# Patient Record
Sex: Female | Born: 1965 | Race: White | Hispanic: No | Marital: Married | Smoking: Current every day smoker
Health system: Southern US, Community
[De-identification: ages and names within clinical notes are randomized; demographics above are authoritative.]

## PROBLEM LIST (undated history)

## (undated) DIAGNOSIS — F32A Depression, unspecified: Secondary | ICD-10-CM

## (undated) DIAGNOSIS — Z974 Presence of external hearing-aid: Secondary | ICD-10-CM

## (undated) DIAGNOSIS — R7303 Prediabetes: Secondary | ICD-10-CM

## (undated) DIAGNOSIS — I219 Acute myocardial infarction, unspecified: Secondary | ICD-10-CM

## (undated) DIAGNOSIS — Z7289 Other problems related to lifestyle: Secondary | ICD-10-CM

## (undated) DIAGNOSIS — G2581 Restless legs syndrome: Secondary | ICD-10-CM

## (undated) DIAGNOSIS — R45851 Suicidal ideations: Secondary | ICD-10-CM

## (undated) DIAGNOSIS — I209 Angina pectoris, unspecified: Secondary | ICD-10-CM

## (undated) DIAGNOSIS — Z972 Presence of dental prosthetic device (complete) (partial): Secondary | ICD-10-CM

## (undated) DIAGNOSIS — F419 Anxiety disorder, unspecified: Secondary | ICD-10-CM

## (undated) DIAGNOSIS — K589 Irritable bowel syndrome without diarrhea: Secondary | ICD-10-CM

## (undated) DIAGNOSIS — E119 Type 2 diabetes mellitus without complications: Secondary | ICD-10-CM

## (undated) DIAGNOSIS — H9190 Unspecified hearing loss, unspecified ear: Secondary | ICD-10-CM

## (undated) DIAGNOSIS — M65312 Trigger thumb, left thumb: Secondary | ICD-10-CM

## (undated) DIAGNOSIS — H9192 Unspecified hearing loss, left ear: Secondary | ICD-10-CM

## (undated) DIAGNOSIS — J449 Chronic obstructive pulmonary disease, unspecified: Secondary | ICD-10-CM

## (undated) DIAGNOSIS — G4730 Sleep apnea, unspecified: Secondary | ICD-10-CM

## (undated) DIAGNOSIS — R569 Unspecified convulsions: Secondary | ICD-10-CM

## (undated) DIAGNOSIS — K219 Gastro-esophageal reflux disease without esophagitis: Secondary | ICD-10-CM

## (undated) DIAGNOSIS — I10 Essential (primary) hypertension: Secondary | ICD-10-CM

## (undated) HISTORY — PX: TYMPANOSTOMY TUBE PLACEMENT: SHX32

## (undated) HISTORY — DX: Restless legs syndrome: G25.81

## (undated) HISTORY — DX: Sleep apnea, unspecified: G47.30

## (undated) HISTORY — DX: Other problems related to lifestyle: Z72.89

## (undated) HISTORY — PX: DILATION AND CURETTAGE OF UTERUS: SHX78

## (undated) HISTORY — PX: TONSILLECTOMY: SUR1361

## (undated) HISTORY — DX: Irritable bowel syndrome, unspecified: K58.9

## (undated) HISTORY — DX: Unspecified hearing loss, unspecified ear: H91.90

## (undated) HISTORY — PX: CARDIAC CATHETERIZATION: SHX172

## (undated) HISTORY — DX: Suicidal ideations: R45.851

## (undated) HISTORY — PX: ABDOMINAL HYSTERECTOMY: SHX81

## (undated) HISTORY — PX: CHOLECYSTECTOMY: SHX55

## (undated) HISTORY — DX: Acute myocardial infarction, unspecified: I21.9

## (undated) HISTORY — PX: HERNIA REPAIR: SHX51

## (undated) HISTORY — PX: ACHILLES TENDON REPAIR: SUR1153

## (undated) HISTORY — DX: Chronic obstructive pulmonary disease, unspecified: J44.9

---

## 2005-03-16 HISTORY — PX: CARDIAC CATHETERIZATION: SHX172

## 2005-03-27 ENCOUNTER — Emergency Department: Payer: Self-pay | Admitting: Emergency Medicine

## 2005-04-01 ENCOUNTER — Emergency Department: Payer: Self-pay | Admitting: General Practice

## 2005-04-21 ENCOUNTER — Encounter: Payer: Self-pay | Admitting: Cardiovascular Disease

## 2005-05-14 ENCOUNTER — Encounter: Payer: Self-pay | Admitting: Cardiovascular Disease

## 2005-06-14 ENCOUNTER — Encounter: Payer: Self-pay | Admitting: Cardiovascular Disease

## 2005-07-14 ENCOUNTER — Encounter: Payer: Self-pay | Admitting: Cardiovascular Disease

## 2005-08-14 ENCOUNTER — Encounter: Payer: Self-pay | Admitting: Cardiovascular Disease

## 2005-09-13 ENCOUNTER — Encounter: Payer: Self-pay | Admitting: Cardiovascular Disease

## 2005-10-01 ENCOUNTER — Other Ambulatory Visit: Payer: Self-pay

## 2005-10-08 ENCOUNTER — Ambulatory Visit: Payer: Self-pay | Admitting: Surgery

## 2005-10-14 ENCOUNTER — Encounter: Payer: Self-pay | Admitting: Cardiovascular Disease

## 2006-01-05 ENCOUNTER — Ambulatory Visit: Payer: Self-pay | Admitting: Unknown Physician Specialty

## 2006-03-22 ENCOUNTER — Ambulatory Visit: Payer: Self-pay | Admitting: Gastroenterology

## 2006-04-22 ENCOUNTER — Ambulatory Visit: Payer: Self-pay | Admitting: Unknown Physician Specialty

## 2006-06-24 ENCOUNTER — Emergency Department: Payer: Self-pay | Admitting: Emergency Medicine

## 2006-06-30 ENCOUNTER — Other Ambulatory Visit: Payer: Self-pay

## 2006-06-30 ENCOUNTER — Ambulatory Visit: Payer: Self-pay | Admitting: Orthopedic Surgery

## 2006-07-07 ENCOUNTER — Other Ambulatory Visit: Payer: Self-pay

## 2006-07-07 ENCOUNTER — Inpatient Hospital Stay: Payer: Self-pay | Admitting: Internal Medicine

## 2006-07-08 ENCOUNTER — Other Ambulatory Visit: Payer: Self-pay

## 2006-08-05 ENCOUNTER — Inpatient Hospital Stay: Payer: Self-pay | Admitting: Unknown Physician Specialty

## 2007-07-14 ENCOUNTER — Ambulatory Visit: Payer: Self-pay | Admitting: Internal Medicine

## 2007-09-10 ENCOUNTER — Emergency Department: Payer: Self-pay | Admitting: Unknown Physician Specialty

## 2008-02-10 ENCOUNTER — Emergency Department: Payer: Self-pay | Admitting: Emergency Medicine

## 2008-04-18 IMAGING — CR DG CHEST 2V
1 series · 2 of 2 positions shown · non-contrast
Comparison: none

REASON FOR EXAM: hypertension
COMMENTS:

PROCEDURE:     DXR - DXR CHEST PA (OR AP) AND LATERAL  - December 28, 2005 [DATE]
RESULT:          The lung fields are clear.  No pneumonia, pneumothorax, or
pleural effusion is seen. The heart, mediastinal and osseous structures show
no significant abnormalities.

[Series 1: view not recorded · 0.17mm/px · 2 of 2 slices shown]
[im 1/2]
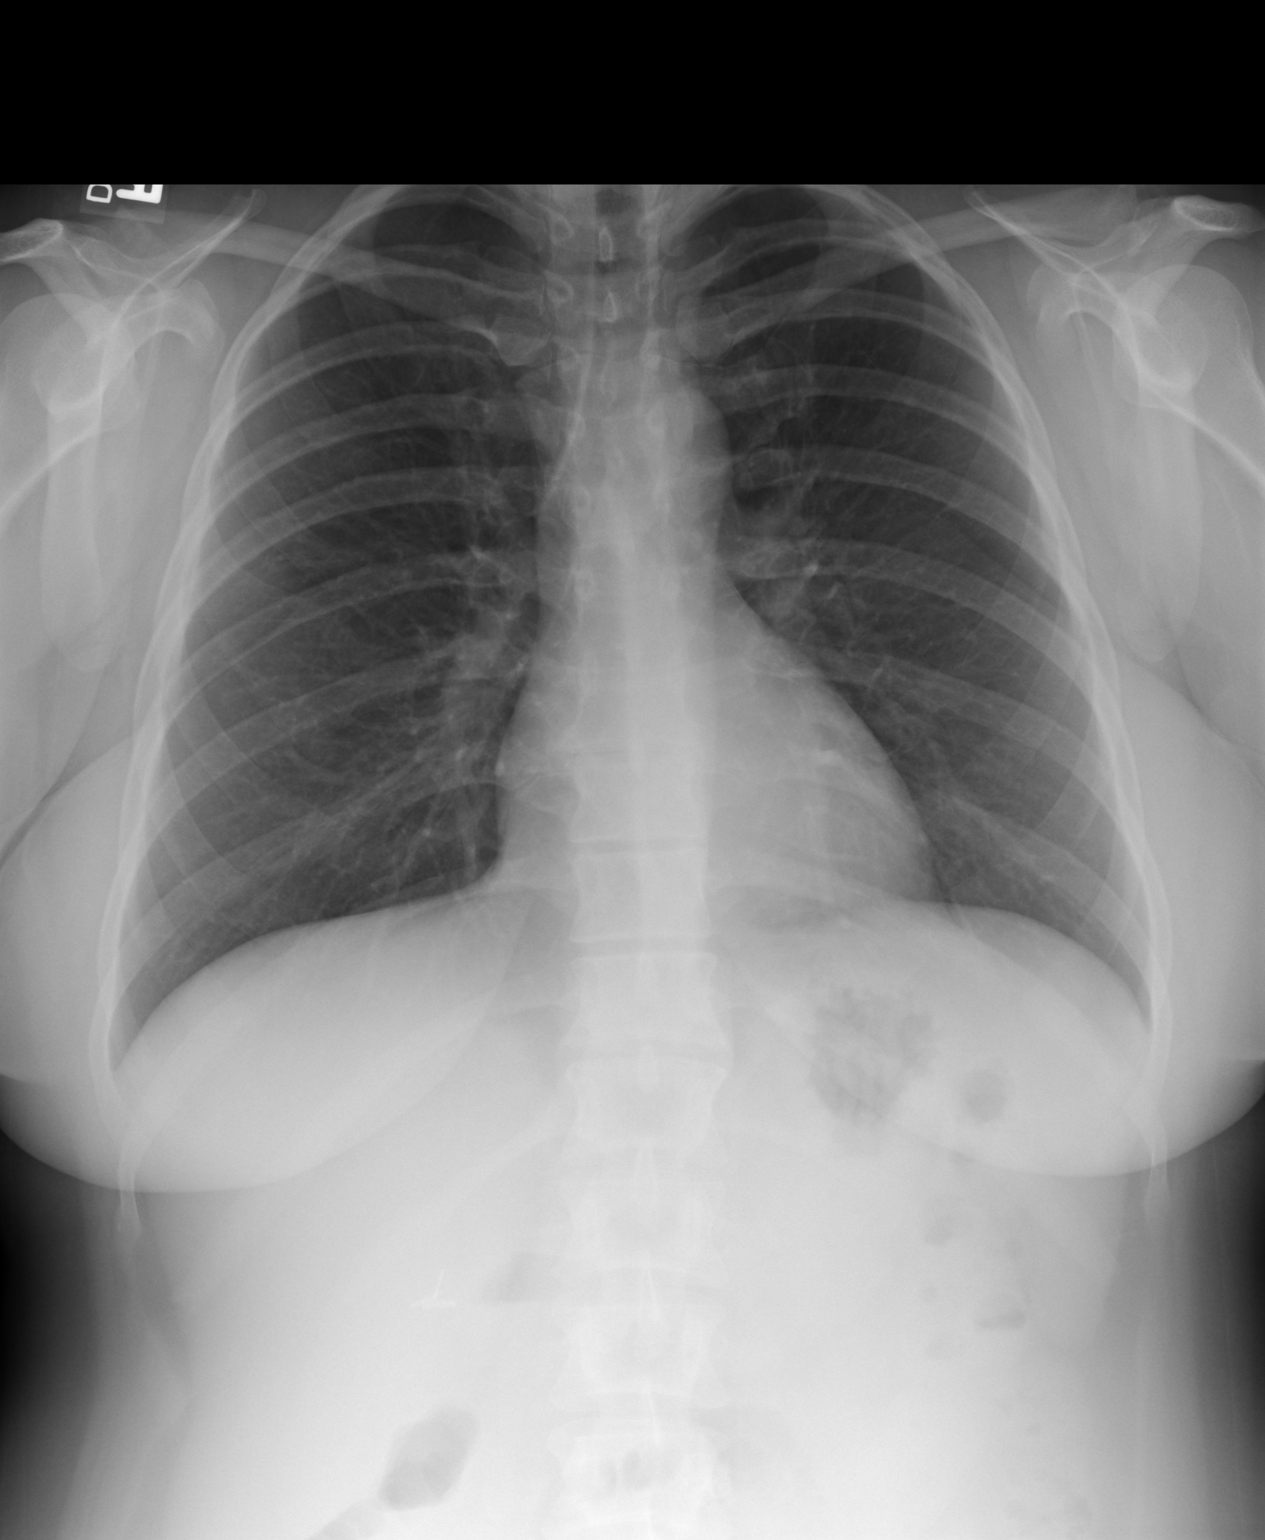
[im 2/2]
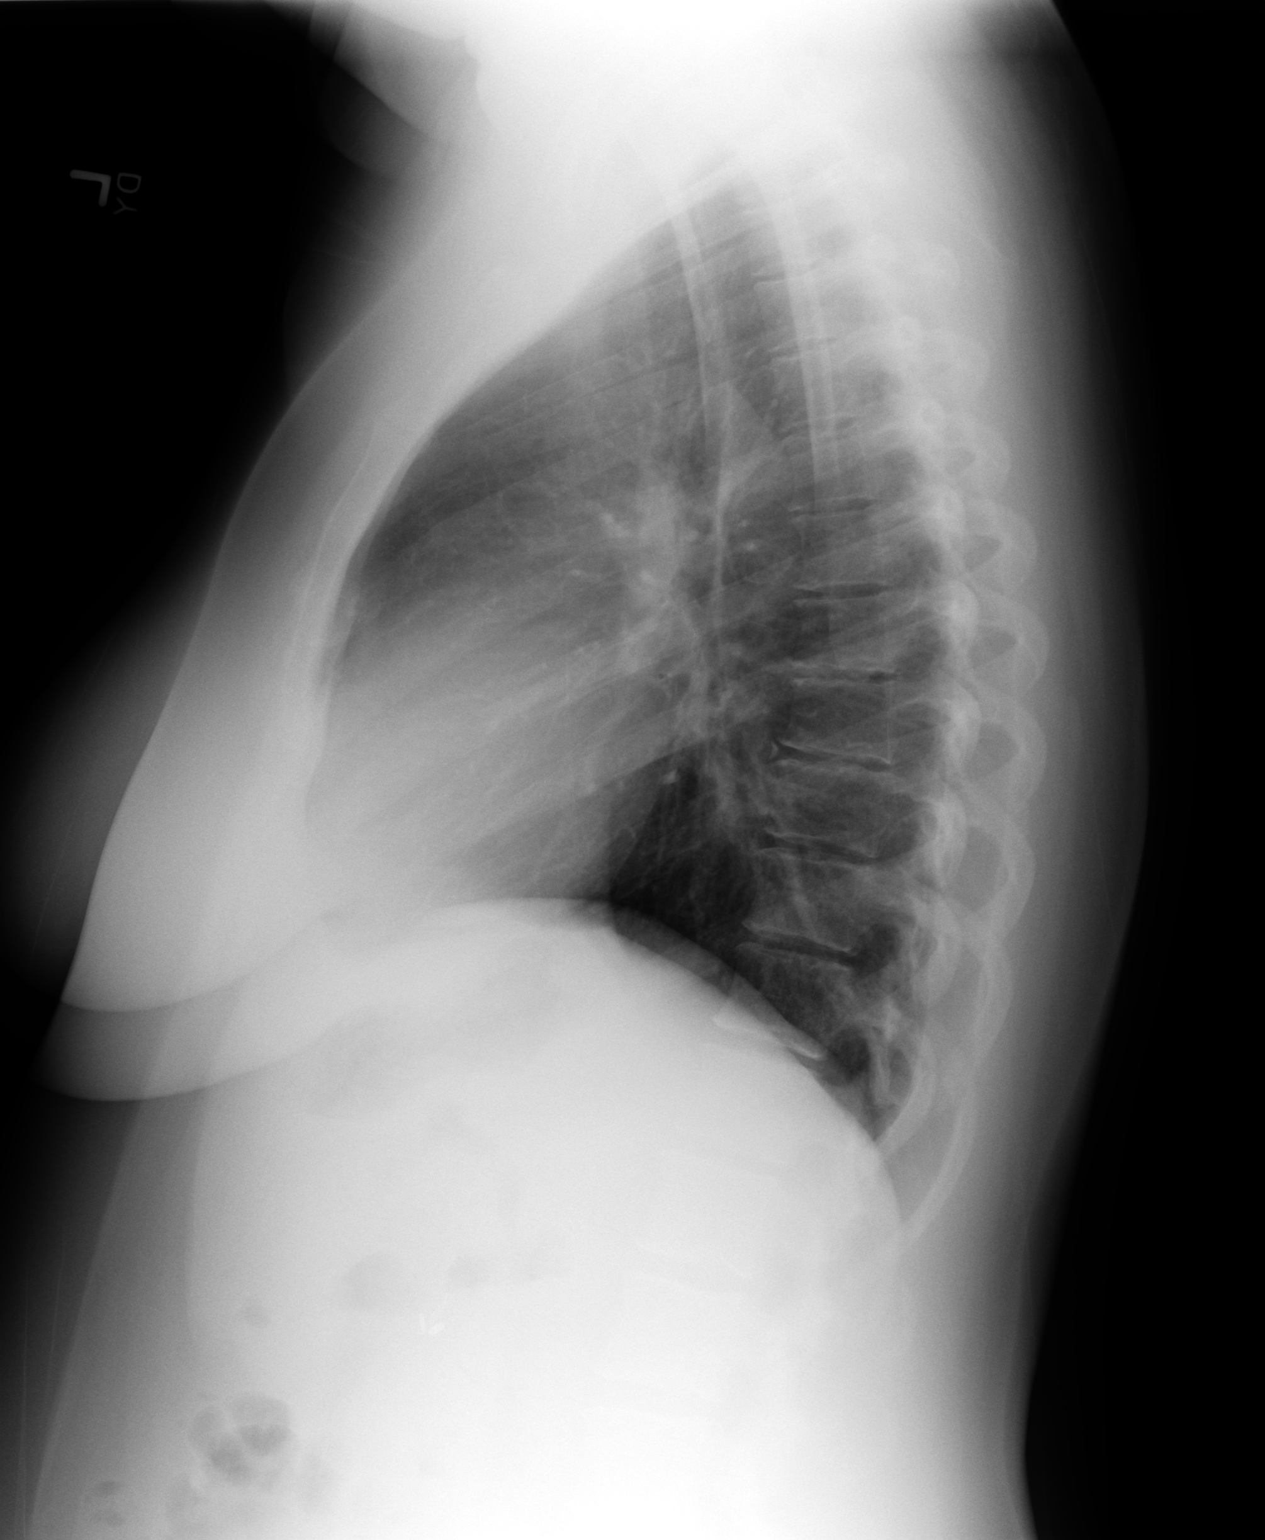

[2 of 2 positions shown; findings below may reference images not displayed]

IMPRESSION: 1.     No significant abnormalities are noted.
2.     No significant interval changes are seen as compared to the prior
exam of 03/27/2005.

## 2008-05-31 ENCOUNTER — Ambulatory Visit: Payer: Self-pay | Admitting: Surgery

## 2008-06-04 ENCOUNTER — Ambulatory Visit: Payer: Self-pay | Admitting: Surgery

## 2008-08-11 ENCOUNTER — Emergency Department: Payer: Self-pay | Admitting: Unknown Physician Specialty

## 2011-06-06 ENCOUNTER — Emergency Department: Payer: Self-pay | Admitting: Internal Medicine

## 2011-06-06 LAB — COMPREHENSIVE METABOLIC PANEL
Alkaline Phosphatase: 61 U/L (ref 50–136)
Anion Gap: 9 (ref 7–16)
BUN: 6 mg/dL — ABNORMAL LOW (ref 7–18)
Bilirubin,Total: 0.2 mg/dL (ref 0.2–1.0)
Chloride: 106 mmol/L (ref 98–107)
Co2: 28 mmol/L (ref 21–32)
Creatinine: 0.75 mg/dL (ref 0.60–1.30)
EGFR (African American): >60
Glucose: 101 mg/dL — ABNORMAL HIGH (ref 65–99)
Osmolality: 283 (ref 275–301)
Potassium: 3.7 mmol/L (ref 3.5–5.1)
SGPT (ALT): 31 U/L
Sodium: 143 mmol/L (ref 136–145)
Total Protein: 7.4 g/dL (ref 6.4–8.2)

## 2011-06-06 LAB — CBC
HCT: 41.5 % (ref 35.0–47.0)
HGB: 14 g/dL (ref 12.0–16.0)
MCH: 29.8 pg (ref 26.0–34.0)
MCHC: 33.7 g/dL (ref 32.0–36.0)
WBC: 5.9 10*3/uL (ref 3.6–11.0)

## 2011-06-06 LAB — CK TOTAL AND CKMB (NOT AT ARMC): CK, Total: 62 U/L (ref 21–215)

## 2011-06-06 LAB — TROPONIN I: Troponin-I: <0.02 ng/mL

## 2011-06-06 LAB — LIPASE, BLOOD: Lipase: 102 U/L (ref 73–393)

## 2012-08-25 ENCOUNTER — Ambulatory Visit: Payer: Self-pay | Admitting: Internal Medicine

## 2012-11-16 ENCOUNTER — Emergency Department: Payer: Self-pay | Admitting: Emergency Medicine

## 2012-11-24 ENCOUNTER — Emergency Department: Payer: Self-pay | Admitting: Emergency Medicine

## 2012-11-24 LAB — COMPREHENSIVE METABOLIC PANEL
Alkaline Phosphatase: 78 U/L (ref 50–136)
Anion Gap: 9 (ref 7–16)
BUN: 10 mg/dL (ref 7–18)
Chloride: 105 mmol/L (ref 98–107)
Co2: 25 mmol/L (ref 21–32)
Creatinine: 0.89 mg/dL (ref 0.60–1.30)
Glucose: 146 mg/dL — ABNORMAL HIGH (ref 65–99)
Osmolality: 279 (ref 275–301)
SGOT(AST): 22 U/L (ref 15–37)
SGPT (ALT): 25 U/L (ref 12–78)
Total Protein: 7.2 g/dL (ref 6.4–8.2)

## 2012-11-24 LAB — CBC
HCT: 40.5 % (ref 35.0–47.0)
HGB: 13.6 g/dL (ref 12.0–16.0)
MCH: 29.7 pg (ref 26.0–34.0)
MCV: 88 fL (ref 80–100)
Platelet: 348 10*3/uL (ref 150–440)
RBC: 4.59 10*6/uL (ref 3.80–5.20)
RDW: 13.8 % (ref 11.5–14.5)
WBC: 10 10*3/uL (ref 3.6–11.0)

## 2012-11-24 LAB — TROPONIN I: Troponin-I: <0.02 ng/mL

## 2013-09-20 ENCOUNTER — Emergency Department: Payer: Self-pay | Admitting: Emergency Medicine

## 2013-09-20 LAB — CBC
HCT: 43.2 % (ref 35.0–47.0)
HGB: 14.4 g/dL (ref 12.0–16.0)
MCH: 30.2 pg (ref 26.0–34.0)
MCHC: 33.2 g/dL (ref 32.0–36.0)
MCV: 91 fL (ref 80–100)
PLATELETS: 325 10*3/uL (ref 150–440)
RBC: 4.76 10*6/uL (ref 3.80–5.20)
RDW: 13.7 % (ref 11.5–14.5)
WBC: 7.9 10*3/uL (ref 3.6–11.0)

## 2013-09-20 LAB — BASIC METABOLIC PANEL
Anion Gap: 8 (ref 7–16)
BUN: 12 mg/dL (ref 7–18)
Calcium, Total: 8.6 mg/dL (ref 8.5–10.1)
Chloride: 104 mmol/L (ref 98–107)
Co2: 27 mmol/L (ref 21–32)
Creatinine: 0.82 mg/dL (ref 0.60–1.30)
EGFR (African American): >60
EGFR (Non-African Amer.): >60
Glucose: 157 mg/dL — ABNORMAL HIGH (ref 65–99)
Osmolality: 281 (ref 275–301)
Potassium: 3.8 mmol/L (ref 3.5–5.1)
Sodium: 139 mmol/L (ref 136–145)

## 2013-09-20 LAB — TROPONIN I
Troponin-I: <0.02 ng/mL
Troponin-I: <0.02 ng/mL

## 2014-04-02 ENCOUNTER — Emergency Department: Payer: Self-pay | Admitting: Emergency Medicine

## 2014-11-11 ENCOUNTER — Encounter: Payer: Self-pay | Admitting: Emergency Medicine

## 2014-11-11 ENCOUNTER — Emergency Department: Payer: BLUE CROSS/BLUE SHIELD

## 2014-11-11 ENCOUNTER — Emergency Department
Admission: EM | Admit: 2014-11-11 | Discharge: 2014-11-11 | Disposition: A | Discharge status: Home / Self Care | Payer: BLUE CROSS/BLUE SHIELD | Attending: Emergency Medicine | Admitting: Emergency Medicine

## 2014-11-11 DIAGNOSIS — Z720 Tobacco use: Secondary | ICD-10-CM | POA: Insufficient documentation

## 2014-11-11 DIAGNOSIS — M25561 Pain in right knee: Secondary | ICD-10-CM | POA: Diagnosis not present

## 2014-11-11 MED ORDER — IBUPROFEN 600 MG PO TABS: 600.0000 mg | ORAL_TABLET | Freq: Three times a day (TID) | ORAL | Status: DC | PRN

## 2014-11-11 NOTE — Discharge Instructions (Signed)
You were evaluated for right knee pain, and although no certain cause was found, your exam and evaluation are reassuring. I suspect a musculoskeletal source. Continue anti-inflammatory such as ibuprofen as needed 3 times daily. Wear the splint for comfort. If not improved in one week, see her primary care physician for consideration of further imaging such as an MRI to evaluate the ligaments.  Return to the emergency department for any worsening and, redness, swelling, fever, muscle aches, dizziness, or any other symptoms concerning to you.

## 2014-11-11 NOTE — ED Notes (Signed)
Pt states this pain states this pain started Monday night pt states she was sleeping and it woke her up from her sleep. Pt states there was swelling to her knee leg and foot currently not swelling other than to her foot. Pt is tender on the inside of her knee when palpated.

## 2014-11-11 NOTE — ED Notes (Signed)
Pt states right leg swelling that began approx 6 days pta. Pt with swelling noted to right ankle and right calf. Pt states medial right knee is tender when moving. Pt denies fever, denies shob. Pt smokes but is not on hormones, no long stasis per pt.

## 2014-11-11 NOTE — ED Provider Notes (Signed)
[facility] Emergency Department Provider Note   ____________________________________________  Time seen: 10:45 PM I have reviewed the triage vital signs and the triage nursing note.  HISTORY  Chief Complaint No chief complaint on file.   Historian Patient and her spouse  HPI Brandi Acevedo is a 49 y.o. female who is presenting for pain to her right knee. It started on Monday when she woke up with this pain. Has been ongoing since that time. No known trauma or overuse injury. She's noticed some lower extremity swelling from her knee down to her foot. No redness or skin rash. No fever.. No systemic symptoms. She has been using naproxen which has helped some. She finally decided to come in because it was not going away and she had foot swelling.    No past medical history on file.  There are no active problems to display for this patient.   No past surgical history on file.  Current Outpatient Rx  Name  Route  Sig  Dispense  Refill  . ibuprofen (ADVIL,MOTRIN) 600 MG tablet   Oral   Take 1 tablet (600 mg total) by mouth every 8 (eight) hours as needed.   20 tablet   0     Allergies Codeine; Flagyl; Keflex; and Septra  No family history on file.  Social History Social History  Substance Use Topics  . Smoking status: Current Some Day Smoker  . Smokeless tobacco: Never Used  . Alcohol Use: No    Review of Systems  Constitutional: Negative for fever. Eyes: Negative for visual changes. ENT: Negative for sore throat. Cardiovascular: Negative for chest pain. Respiratory: Negative for shortness of breath. Gastrointestinal: Negative for abdominal pain, vomiting and diarrhea. Genitourinary: Negative for dysuria. Musculoskeletal: Negative for back pain. Skin: Negative for rash. Neurological: Negative for headache. 10 point Review of Systems otherwise negative ____________________________________________   PHYSICAL EXAM:  VITAL  SIGNS: ED Triage Vitals  Enc Vitals Group     BP 11/11/14 2014 128/61 mmHg     Pulse Rate 11/11/14 2014 72     Resp 11/11/14 2014 18     Temp 11/11/14 2014 99.1 F (37.3 C)     Temp Source 11/11/14 2014 Oral     SpO2 11/11/14 2014 96 %     Weight 11/11/14 2014 164 lb (74.39 kg)     Height 11/11/14 2014 5\' 1"  (1.549 m)     Head Cir --      Peak Flow --      Pain Score 11/11/14 2021 7     Pain Loc --      Pain Edu? --      Excl. in [place]? --      Constitutional: Alert and oriented. Well appearing and in no distress. Eyes: Conjunctivae are normal. PERRL. Normal extraocular movements. ENT   Head: Normocephalic and atraumatic.   Nose: No congestion/rhinnorhea.   Mouth/Throat: Mucous membranes are moist.   Neck: No stridor. Cardiovascular/Chest: Normal rate, regular rhythm.  No murmurs, rubs, or gallops. Respiratory: Normal respiratory effort without tachypnea nor retractions. Breath sounds are clear and equal bilaterally. No wheezes/rales/rhonchi. Gastrointestinal: Soft. No distention, no guarding, no rebound. Nontender   Genitourinary/rectal:Deferred Musculoskeletal: Nontender with normal range of motion in all extremities. No joint effusions.  Right knee medial and anterior joint margin slightly tender to palpation and with bending of the knee. No redness, or warmth. Minimal lower extremity edema right foot. No calf tenderness. Neurologic:  Normal speech and language. No gross  or focal neurologic deficits are appreciated. Skin:  Skin is warm, dry and intact. No rash noted. Psychiatric: Mood and affect are normal. Speech and behavior are normal. Patient exhibits appropriate insight and judgment.  ____________________________________________   EKG I, Lisa Roca, MD, the attending physician have personally viewed and interpreted all ECGs.  No EKG performed ____________________________________________  LABS (pertinent  positives/negatives)  None  ____________________________________________  RADIOLOGY All Xrays were viewed by me. Imaging interpreted by Radiologist.  Ultrasound lower extremity right: No DVT __________________________________________  PROCEDURES  Procedure(s) performed: None  Critical Care performed: None  ____________________________________________   ED COURSE / ASSESSMENT AND PLAN  CONSULTATIONS: None  Pertinent labs & imaging results that were available during my care of the patient were reviewed by me and considered in my medical decision making (see chart for details).   No certain cause of patient's right knee pain and foot/leg swelling, however I suspect a muscular skeletal source. No evidence of joint infection clinically. I don't suspect gout clinically. Ultrasound was negative for DVT.  Patient is placed in the immobilizer for comfort. I will have her continue on anti-inflammatory medication. I'll refer back to primary care physician Fortune follows the [facility]. Consider MRI if not improving.  Patient / Family / Caregiver informed of clinical course, medical decision-making process, and agree with plan.   I discussed return precautions, follow-up instructions, and discharged instructions with patient and/or family.  ___________________________________________   FINAL CLINICAL IMPRESSION(S) / ED DIAGNOSES   Final diagnoses:  Right knee pain       Lisa Roca, MD 11/11/14 2309

## 2014-11-11 NOTE — ED Notes (Signed)
Patient discharged with husband to home.

## 2015-02-04 ENCOUNTER — Other Ambulatory Visit: Payer: Self-pay

## 2015-02-04 NOTE — Telephone Encounter (Signed)
Yes. I will not write any rxs until I have actually seen the patient as they are not my patient until they've established with me.

## 2015-02-04 NOTE — Telephone Encounter (Signed)
You did not write this Rx. Freddy Finner, NP wrote this 09/04/2014. (Christie) Patient has an upcoming appt with you 02/26/2015  The pharmacy noted Urgent, patient is out of medication.  Should I fax this back to the health center?

## 2015-02-26 ENCOUNTER — Ambulatory Visit (INDEPENDENT_AMBULATORY_CARE_PROVIDER_SITE_OTHER): Payer: BLUE CROSS/BLUE SHIELD | Admitting: Family Medicine

## 2015-02-26 ENCOUNTER — Encounter: Payer: Self-pay | Admitting: Family Medicine

## 2015-02-26 VITALS — BP 147/84 | HR 75 | Temp 97.0°F | Ht 60.3 in | Wt 168.0 lb

## 2015-02-26 DIAGNOSIS — K219 Gastro-esophageal reflux disease without esophagitis: Secondary | ICD-10-CM | POA: Insufficient documentation

## 2015-02-26 DIAGNOSIS — K589 Irritable bowel syndrome without diarrhea: Secondary | ICD-10-CM | POA: Insufficient documentation

## 2015-02-26 DIAGNOSIS — G2581 Restless legs syndrome: Secondary | ICD-10-CM | POA: Insufficient documentation

## 2015-02-26 DIAGNOSIS — Z720 Tobacco use: Secondary | ICD-10-CM | POA: Diagnosis not present

## 2015-02-26 DIAGNOSIS — G4730 Sleep apnea, unspecified: Secondary | ICD-10-CM | POA: Insufficient documentation

## 2015-02-26 MED ORDER — PANTOPRAZOLE SODIUM 40 MG PO TBEC: 40.0000 mg | DELAYED_RELEASE_TABLET | Freq: Every day | ORAL | Status: DC

## 2015-02-26 MED ORDER — ROPINIROLE HCL 0.25 MG PO TABS: 0.2500 mg | ORAL_TABLET | Freq: Every day | ORAL | Status: DC

## 2015-02-26 MED ORDER — BUPROPION HCL 100 MG PO TABS: ORAL_TABLET | ORAL | Status: DC

## 2015-02-26 MED ORDER — DICYCLOMINE HCL 20 MG PO TABS: 20.0000 mg | ORAL_TABLET | Freq: Four times a day (QID) | ORAL | Status: DC

## 2015-02-26 NOTE — Patient Instructions (Signed)

## 2015-02-26 NOTE — Assessment & Plan Note (Signed)
Takes bentyl very occasionally. Continue current regimen. Continue to monitor.

## 2015-02-26 NOTE — Progress Notes (Signed)
BP 147/84 mmHg  Pulse 75  Temp(Src) 97 F (36.1 C)  Ht 5' 0.3" (1.532 m)  Wt 168 lb (76.204 kg)  BMI 32.47 kg/m2   Subjective:    Patient ID: Brandi Acevedo, female    DOB: April 19, 1965, 49 y.o.   MRN: [id number]  HPI: Brandi Acevedo is a 49 y.o. female who presents today to establish care and for evaluation of the following. Has not seen a doctor since June. Has been out of her requip for about 2 months.   Chief Complaint  Patient presents with  . Restless Leg    Patient needs a refill on Requip  . Nicotine Dependence    Patient would like to try Wellbutrin  . Irritable Bowel Syndrome    Needs a refill on Bentyl   RESTLESS LEGS- had sleep study about 10 years ago through [facility] Duration: 10 years Discomfort description:  jumping and cramping Pain: yes Location: calves Bilateral: yes Symmetric: yes Severity: moderate Onset:  sudden Frequency:  every night, all night long Symptoms only occur while legs at rest: no Sudden unintentional leg jerking: yes Bed partner bothered by leg movements: no LE numbness: no Decreased sensation: no Weakness: no Insomnia: yes Daytime somnolence: no Fatigue: no Alleviating factors: requip Aggravating factors: nothing Status: worse- has been out of the medicine for 2 months. Treatments attempted: requip  IBS- gets bad cramps since her gall bladder came out Duration: since 2007, has been taking the bentyl for GERD, has been taking it just occasionally Nature: cramping Location: epigastric  Severity: severe  Radiation: no Episode duration: hours Frequency: occasional 1x a month Alleviating factors: bentyl Aggravating factors: unknown Treatments attempted: bentyl and PPI Constipation: no Diarrhea: yes Mucous in the stool: no Heartburn: yes Bloating:no Flatulence: no Nausea: no Vomiting: no Melena or hematochezia: no Rash: no Jaundice: no Fever: no Weight loss: no  SMOKING CESSATION Smoking Status: current every  day smoker for 30 years Smoking Amount: 1/2ppd Smoking Onset: 18/49yo Smoking Quit Date: Not set Smoking triggers: waking up, stress, her daughter Type of tobacco use: cigarettes Children in the house: no Other household members who smoke: no Treatments attempted: chantix (didn't like it), patches, vaping Pneumovax: this year  Relevant past medical, surgical, family and social history reviewed and updated as indicated. Interim medical history since our last visit reviewed. Allergies and medications reviewed and updated.  Review of Systems  Constitutional: Negative.   Respiratory: Negative.   Cardiovascular: Negative.   Gastrointestinal: Positive for abdominal pain and diarrhea. Negative for nausea, vomiting, constipation, blood in stool, abdominal distention, anal bleeding and rectal pain.  Musculoskeletal: Negative.   Neurological: Negative.   Psychiatric/Behavioral: Negative.    Per HPI unless specifically indicated above     Objective:    BP 147/84 mmHg  Pulse 75  Temp(Src) 97 F (36.1 C)  Ht 5' 0.3" (1.532 m)  Wt 168 lb (76.204 kg)  BMI 32.47 kg/m2  Wt Readings from Last 3 Encounters:  02/26/15 168 lb (76.204 kg)  11/11/14 164 lb (74.39 kg)    Physical Exam  Constitutional: She is oriented to person, place, and time. She appears well-developed and well-nourished. No distress.  HENT:  Head: Normocephalic and atraumatic.  Right Ear: Hearing normal.  Left Ear: Hearing normal.  Nose: Nose normal.  Eyes: Conjunctivae and lids are normal. Right eye exhibits no discharge. Left eye exhibits no discharge. No scleral icterus.  Cardiovascular: Normal rate, regular rhythm, normal heart sounds and intact distal pulses.  Exam reveals  no gallop and no friction rub.   No murmur heard. Pulmonary/Chest: Effort normal and breath sounds normal. No respiratory distress. She has no wheezes. She has no rales. She exhibits no tenderness.  Abdominal: Soft. Bowel sounds are normal. She  exhibits no distension and no mass. There is no tenderness. There is no rebound and no guarding.  Musculoskeletal: Normal range of motion.  Neurological: She is alert and oriented to person, place, and time.  Skin: Skin is warm, dry and intact. No rash noted. No erythema. No pallor.  Psychiatric: She has a normal mood and affect. Her speech is normal and behavior is normal. Judgment and thought content normal. Cognition and memory are normal.  Nursing note and vitals reviewed.   Results for orders placed or performed in visit on 09/20/13  Troponin I  Result Value Ref Range   Troponin-I < 0.02 ng/mL  CBC  Result Value Ref Range   WBC 7.9 3.6-11.0 x10 3/mm 3   RBC 4.76 3.80-5.20 X10 6/mm 3   HGB 14.4 12.0-16.0 g/dL   HCT 43.2 35.0-47.0 %   MCV 91 80-100 fL   MCH 30.2 26.0-34.0 pg   MCHC 33.2 32.0-36.0 g/dL   RDW 13.7 11.5-14.5 %   Platelet 325 150-440 x10 3/mm 3  Basic metabolic panel  Result Value Ref Range   Glucose 157 (H) 65-99 mg/dL   BUN 12 7-18 mg/dL   Creatinine 0.82 0.60-1.30 mg/dL   Sodium 139 136-145 mmol/L   Potassium 3.8 3.5-5.1 mmol/L   Chloride 104 98-107 mmol/L   Co2 27 21-32 mmol/L   Calcium, Total 8.6 8.5-10.1 mg/dL   Osmolality 281 275-301   Anion Gap 8 7-16   EGFR (African American) >60    EGFR (Non-African Amer.) >60   Troponin I  Result Value Ref Range   Troponin-I < 0.02 ng/mL      Assessment & Plan:   Problem List Items Addressed This Visit      Digestive   IBS (irritable bowel syndrome)    Takes bentyl very occasionally. Continue current regimen. Continue to monitor.       Relevant Medications   pantoprazole (PROTONIX) 40 MG tablet   dicyclomine (BENTYL) 20 MG tablet   GERD (gastroesophageal reflux disease)    Well controlled on current regimen. Continue current regimen. Continue to monitor      Relevant Medications   pantoprazole (PROTONIX) 40 MG tablet   dicyclomine (BENTYL) 20 MG tablet     Other   RLS (restless legs syndrome) -  Primary    Well controlled on her requip. Refill given today. Continue current regimen. Continue to monitor.       Tobacco abuse    Smoking cessation counseling given today. Up to date on vaccines. Will start on wellbutrin. Check in in 1 month to see how she's doing.           Follow up plan: Return in about 4 weeks (around 03/26/2015) for smoking follow up, Records release.

## 2015-02-26 NOTE — Assessment & Plan Note (Signed)
Smoking cessation counseling given today. Up to date on vaccines. Will start on wellbutrin. Check in in 1 month to see how she's doing.

## 2015-02-26 NOTE — Assessment & Plan Note (Signed)
Well controlled on current regimen. Continue current regimen. Continue to monitor.  

## 2015-02-26 NOTE — Assessment & Plan Note (Signed)
Well controlled on her requip. Refill given today. Continue current regimen. Continue to monitor.

## 2015-03-26 ENCOUNTER — Ambulatory Visit: Payer: BLUE CROSS/BLUE SHIELD | Admitting: Family Medicine

## 2015-03-28 ENCOUNTER — Ambulatory Visit (INDEPENDENT_AMBULATORY_CARE_PROVIDER_SITE_OTHER): Payer: BLUE CROSS/BLUE SHIELD | Admitting: Family Medicine

## 2015-03-28 ENCOUNTER — Encounter: Payer: Self-pay | Admitting: Family Medicine

## 2015-03-28 VITALS — BP 123/86 | HR 90 | Temp 98.8°F | Ht 60.2 in | Wt 169.0 lb

## 2015-03-28 DIAGNOSIS — Z1239 Encounter for other screening for malignant neoplasm of breast: Secondary | ICD-10-CM | POA: Diagnosis not present

## 2015-03-28 DIAGNOSIS — M1990 Unspecified osteoarthritis, unspecified site: Secondary | ICD-10-CM

## 2015-03-28 DIAGNOSIS — Z720 Tobacco use: Secondary | ICD-10-CM | POA: Diagnosis not present

## 2015-03-28 MED ORDER — NAPROXEN 500 MG PO TABS: 500.0000 mg | ORAL_TABLET | Freq: Two times a day (BID) | ORAL | Status: DC

## 2015-03-28 NOTE — Progress Notes (Signed)
BP 123/86 mmHg  Pulse 90  Temp(Src) 98.8 F (37.1 C)  Ht 5' 0.2" (1.529 m)  Wt 169 lb (76.658 kg)  BMI 32.79 kg/m2  SpO2 98%   Subjective:    Patient ID: Brandi Acevedo, female    DOB: 04-23-1965, 50 y.o.   MRN: [id number]  HPI: Brandi Acevedo is a 50 y.o. female  Chief Complaint  Patient presents with  . Nicotine Dependence    Patient states that she has cut back a lot, she only smokes a couple a day. One pack lasted from Saturday to Friday.   Here today for a 1 month follow up on her smoking. She has been taking her wellbutrin- also helping with her mood with her daughter  SMOKING CESSATION Smoking Status: has cut back a lot, only smoking a bit over a pack a week Smoking Amount: 1 pack in about a week Smoking Onset: 18/50yo  Smoking Quit Date: Not set Smoking triggers: waking up, stress Type of tobacco use: cigarettes Children in the house: no Other household members who smoke: no Treatments attempted: chantix (didn't like it) patches, vape and now wellbutrin.   Pneumovax: up to date  KNEE PAIN Duration: In June was in the ER with knee pain and was in a brace. Got better with the brace, then started on Saturday, painful and aching, they said it was arthritis Involved knee: right Mechanism of injury: unknown Location:anterior, medial Onset: sudden Severity: severe  Quality:  aching Frequency: intermittent Radiation: no Aggravating factors: weight bearing, walking, running, stairs and bending  Alleviating factors: ice and NSAIDs  Status: better Treatments attempted: rest and aleve  Relief with NSAIDs?:  significant Weakness with weight bearing or walking: no Sensation of giving way: no Locking: no Popping: no Bruising: no Swelling: yes Redness: no Paresthesias/decreased sensation: no Fevers: no  Relevant past medical, surgical, family and social history reviewed and updated as indicated. Interim medical history since our last visit reviewed. Allergies  and medications reviewed and updated.  Review of Systems  Constitutional: Negative.   Respiratory: Negative.   Cardiovascular: Negative.   Psychiatric/Behavioral: Negative.     Per HPI unless specifically indicated above     Objective:    BP 123/86 mmHg  Pulse 90  Temp(Src) 98.8 F (37.1 C)  Ht 5' 0.2" (1.529 m)  Wt 169 lb (76.658 kg)  BMI 32.79 kg/m2  SpO2 98%  Wt Readings from Last 3 Encounters:  03/28/15 169 lb (76.658 kg)  02/26/15 168 lb (76.204 kg)  11/11/14 164 lb (74.39 kg)    Physical Exam  Constitutional: She is oriented to person, place, and time. She appears well-developed and well-nourished. No distress.  HENT:  Head: Normocephalic and atraumatic.  Right Ear: Hearing normal.  Left Ear: Hearing normal.  Nose: Nose normal.  Eyes: Conjunctivae and lids are normal. Right eye exhibits no discharge. Left eye exhibits no discharge. No scleral icterus.  Cardiovascular: Normal rate, regular rhythm, normal heart sounds and intact distal pulses.  Exam reveals no gallop and no friction rub.   No murmur heard. Pulmonary/Chest: Effort normal and breath sounds normal. No respiratory distress. She has no wheezes. She has no rales. She exhibits no tenderness.  Musculoskeletal: Normal range of motion. She exhibits tenderness. She exhibits no edema.  Tender to medial anterior joint line. Negative Mcmurrays, negative appleys, negative anterior and posterior drawer.   Neurological: She is alert and oriented to person, place, and time.  Skin: Skin is warm, dry and intact. No rash  noted. No erythema. No pallor.  Psychiatric: She has a normal mood and affect. Her speech is normal and behavior is normal. Judgment and thought content normal. Cognition and memory are normal.  Nursing note and vitals reviewed.   Results for orders placed or performed in visit on 09/20/13  Troponin I  Result Value Ref Range   Troponin-I < 0.02 ng/mL  CBC  Result Value Ref Range   WBC 7.9 3.6-11.0  x10 3/mm 3   RBC 4.76 3.80-5.20 X10 6/mm 3   HGB 14.4 12.0-16.0 g/dL   HCT 43.2 35.0-47.0 %   MCV 91 80-100 fL   MCH 30.2 26.0-34.0 pg   MCHC 33.2 32.0-36.0 g/dL   RDW 13.7 11.5-14.5 %   Platelet 325 150-440 x10 3/mm 3  Basic metabolic panel  Result Value Ref Range   Glucose 157 (H) 65-99 mg/dL   BUN 12 7-18 mg/dL   Creatinine 0.82 0.60-1.30 mg/dL   Sodium 139 136-145 mmol/L   Potassium 3.8 3.5-5.1 mmol/L   Chloride 104 98-107 mmol/L   Co2 27 21-32 mmol/L   Calcium, Total 8.6 8.5-10.1 mg/dL   Osmolality 281 275-301   Anion Gap 8 7-16   EGFR (African American) >60    EGFR (Non-African Amer.) >60   Troponin I  Result Value Ref Range   Troponin-I < 0.02 ng/mL      Assessment & Plan:   Problem List Items Addressed This Visit      Other   Tobacco abuse - Primary    Doing much better. Cutting back with wellbutrin. Continue to cut back. Continue for now. Continue to monitor. Recheck at physical in 3-4 months       Other Visit Diagnoses    Arthritis        Naproxen for if she has a flare again. Continue to monitor.     Relevant Medications    naproxen (NAPROSYN) 500 MG tablet    Screening for breast cancer        Mammogram order put in today.    Relevant Orders    MM DIGITAL SCREENING BILATERAL        Follow up plan: Return 3-4 months for physical.

## 2015-03-28 NOTE — Patient Instructions (Signed)

## 2015-03-28 NOTE — Assessment & Plan Note (Signed)
Doing much better. Cutting back with wellbutrin. Continue to cut back. Continue for now. Continue to monitor. Recheck at physical in 3-4 months

## 2015-04-25 ENCOUNTER — Telehealth: Payer: Self-pay | Admitting: Family Medicine

## 2015-04-25 ENCOUNTER — Ambulatory Visit
Admission: RE | Admit: 2015-04-25 | Discharge: 2015-04-25 | Disposition: A | Discharge status: Home / Self Care | Payer: BLUE CROSS/BLUE SHIELD | Source: Ambulatory Visit | Attending: Family Medicine | Admitting: Family Medicine

## 2015-04-25 ENCOUNTER — Ambulatory Visit (INDEPENDENT_AMBULATORY_CARE_PROVIDER_SITE_OTHER): Payer: BLUE CROSS/BLUE SHIELD | Admitting: Family Medicine

## 2015-04-25 ENCOUNTER — Encounter: Payer: Self-pay | Admitting: Family Medicine

## 2015-04-25 VITALS — BP 133/85 | HR 81 | Temp 98.6°F | Ht 60.2 in | Wt 173.0 lb

## 2015-04-25 DIAGNOSIS — M25561 Pain in right knee: Secondary | ICD-10-CM | POA: Diagnosis not present

## 2015-04-25 DIAGNOSIS — M1711 Unilateral primary osteoarthritis, right knee: Secondary | ICD-10-CM | POA: Diagnosis not present

## 2015-04-25 DIAGNOSIS — M25569 Pain in unspecified knee: Secondary | ICD-10-CM | POA: Insufficient documentation

## 2015-04-25 MED ORDER — PREDNISONE 50 MG PO TABS: 50.0000 mg | ORAL_TABLET | Freq: Every day | ORAL | Status: DC

## 2015-04-25 NOTE — Telephone Encounter (Signed)
No answer, left message for patient to return my call.

## 2015-04-25 NOTE — Assessment & Plan Note (Signed)
Very tender to moderate palpation. Will have her get x-ray. Will check for gout and tick bourne diseases to make sure there is not any other causes. Will treat with burst of prednisone to see if that helps. Referral to orthopedics made today.

## 2015-04-25 NOTE — Progress Notes (Signed)
BP 133/85 mmHg  Pulse 81  Temp(Src) 98.6 F (37 C)  Ht 5' 0.2" (1.529 m)  Wt 173 lb (78.472 kg)  BMI 33.57 kg/m2  SpO2 95%   Subjective:    Patient ID: Brandi Acevedo, female    DOB: [birth year], 50 y.o.   MRN: [id number]  HPI: Brandi Acevedo is a 50 y.o. female  Chief Complaint  Patient presents with  . Knee Pain    Right knee, patient states that the naproxen is not helping. She has been in a lot of pain for the last 1-2 weeks   KNEE PAIN- Took the naproxen for a week, then went away, then stopped it. Pain got worse about a week or so ago and pain has not gone away Duration: Got better after acting up in June, then started again about a month ago Involved knee: right Mechanism of injury: unknown Location:medial Onset: sudden Severity: severe  Quality:  aching Frequency: constant Radiation: no Aggravating factors: weight bearing, walking, running, stairs and bending  Alleviating factors: ice, NSAIDs and brace  Status: worse Treatments attempted: rest, ice, aleve and HEP  Relief with NSAIDs?:  no Weakness with weight bearing or walking: no Sensation of giving way: yes Locking: yes Popping: no Bruising: no Swelling: no Redness: no Paresthesias/decreased sensation: no Fevers: no  Relevant past medical, surgical, family and social history reviewed and updated as indicated. Interim medical history since our last visit reviewed. Allergies and medications reviewed and updated.  Review of Systems  Constitutional: Negative.   Respiratory: Negative.   Cardiovascular: Negative.   Musculoskeletal: Positive for arthralgias and gait problem. Negative for myalgias, back pain, joint swelling, neck pain and neck stiffness.    Per HPI unless specifically indicated above     Objective:    BP 133/85 mmHg  Pulse 81  Temp(Src) 98.6 F (37 C)  Ht 5' 0.2" (1.529 m)  Wt 173 lb (78.472 kg)  BMI 33.57 kg/m2  SpO2 95%  Wt Readings from Last 3 Encounters:  04/25/15 173  lb (78.472 kg)  03/28/15 169 lb (76.658 kg)  02/26/15 168 lb (76.204 kg)    Physical Exam  Constitutional: She is oriented to person, place, and time. She appears well-developed and well-nourished. No distress.  HENT:  Head: Normocephalic and atraumatic.  Right Ear: Hearing normal.  Left Ear: Hearing normal.  Nose: Nose normal.  Eyes: Conjunctivae and lids are normal. Right eye exhibits no discharge. Left eye exhibits no discharge. No scleral icterus.  Cardiovascular: Normal rate, regular rhythm, normal heart sounds and intact distal pulses.  Exam reveals no gallop and no friction rub.   No murmur heard. Pulmonary/Chest: Effort normal and breath sounds normal. No respiratory distress. She has no wheezes. She has no rales. She exhibits no tenderness.  Musculoskeletal: Normal range of motion. She exhibits tenderness. She exhibits no edema.  Tenderness to palpation of MCL, FROM, Negative lachman's negative anterior and posterior drawer, + McMurray, Negative applys compression and distraction  Neurological: She is alert and oriented to person, place, and time.  Skin: Skin is warm, dry and intact. No rash noted. No erythema. No pallor.  Psychiatric: She has a normal mood and affect. Her speech is normal and behavior is normal. Judgment and thought content normal. Cognition and memory are normal.  Nursing note and vitals reviewed.   Results for orders placed or performed in visit on 09/20/13  Troponin I  Result Value Ref Range   Troponin-I < 0.02 ng/mL  CBC  Result  Value Ref Range   WBC 7.9 3.6-11.0 x10 3/mm 3   RBC 4.76 3.80-5.20 X10 6/mm 3   HGB 14.4 12.0-16.0 g/dL   HCT 43.2 35.0-47.0 %   MCV 91 80-100 fL   MCH 30.2 26.0-34.0 pg   MCHC 33.2 32.0-36.0 g/dL   RDW 13.7 11.5-14.5 %   Platelet 325 150-440 x10 3/mm 3  Basic metabolic panel  Result Value Ref Range   Glucose 157 (H) 65-99 mg/dL   BUN 12 7-18 mg/dL   Creatinine 0.82 0.60-1.30 mg/dL   Sodium 139 136-145 mmol/L    Potassium 3.8 3.5-5.1 mmol/L   Chloride 104 98-107 mmol/L   Co2 27 21-32 mmol/L   Calcium, Total 8.6 8.5-10.1 mg/dL   Osmolality 281 275-301   Anion Gap 8 7-16   EGFR (African American) >60    EGFR (Non-African Amer.) >60   Troponin I  Result Value Ref Range   Troponin-I < 0.02 ng/mL      Assessment & Plan:   Problem List Items Addressed This Visit      Other   Recurrent knee pain - Primary    Very tender to moderate palpation. Will have her get x-ray. Will check for gout and tick bourne diseases to make sure there is not any other causes. Will treat with burst of prednisone to see if that helps. Referral to orthopedics made today.      Relevant Orders   Uric acid   Lyme Ab/Western Blot Reflex   Rocky mtn spotted fvr abs pnl(IgG+IgM)   Ehrlichia Antibody Panel   Babesia microti Antibody Panel   DG Knee Complete 4 Views Right   Ambulatory referral to Orthopedic Surgery       Follow up plan: Return in about 1 week (around 05/02/2015) for follow up on knee.

## 2015-04-25 NOTE — Telephone Encounter (Signed)
Please let her know that her x-ray showed arthritis, worse over the area where her pain is. Take the medicine we sent and we'll see how she's doing next week.

## 2015-04-26 NOTE — Telephone Encounter (Signed)
Patient notified

## 2015-04-27 LAB — URIC ACID: URIC ACID: 7.3 mg/dL — AB (ref 2.5–7.1)

## 2015-04-27 LAB — ROCKY MTN SPOTTED FVR ABS PNL(IGG+IGM)
RMSF IGG: NEGATIVE
RMSF IGM: 0.23 {index} (ref 0.00–0.89)

## 2015-04-27 LAB — EHRLICHIA ANTIBODY PANEL
E. Chaffeensis (HME) IgM Titer: NEGATIVE
E.Chaffeensis (HME) IgG: NEGATIVE
HGE IGG TITER: NEGATIVE
HGE IgM Titer: NEGATIVE

## 2015-04-27 LAB — LYME AB/WESTERN BLOT REFLEX
LYME DISEASE AB, QUANT, IGM: <0.8 index (ref 0.00–0.79)
Lyme IgG/IgM Ab: <0.91 {ISR} (ref 0.00–0.90)

## 2015-04-27 LAB — BABESIA MICROTI ANTIBODY PANEL: Babesia microti IgM: <1:10 {titer}

## 2015-04-29 ENCOUNTER — Telehealth: Payer: Self-pay | Admitting: Family Medicine

## 2015-04-29 NOTE — Telephone Encounter (Signed)
Please let her know that her labs came back negative. No sign of any tick disease. Thanks!

## 2015-04-29 NOTE — Telephone Encounter (Signed)
Patient notified

## 2015-05-03 ENCOUNTER — Encounter: Payer: Self-pay | Admitting: Family Medicine

## 2015-05-03 ENCOUNTER — Ambulatory Visit (INDEPENDENT_AMBULATORY_CARE_PROVIDER_SITE_OTHER): Payer: BLUE CROSS/BLUE SHIELD | Admitting: Family Medicine

## 2015-05-03 VITALS — BP 149/90 | HR 73 | Temp 98.5°F | Ht 60.4 in | Wt 176.0 lb

## 2015-05-03 DIAGNOSIS — M25561 Pain in right knee: Secondary | ICD-10-CM | POA: Diagnosis not present

## 2015-05-03 DIAGNOSIS — H6593 Unspecified nonsuppurative otitis media, bilateral: Secondary | ICD-10-CM | POA: Diagnosis not present

## 2015-05-03 NOTE — Patient Instructions (Signed)
Generic Knee Exercises EXERCISES RANGE OF MOTION (ROM) AND STRETCHING EXERCISES These exercises may help you when beginning to rehabilitate your injury. Your symptoms may resolve with or without further involvement from your physician, physical therapist, or athletic trainer. While completing these exercises, remember:   Restoring tissue flexibility helps normal motion to return to the joints. This allows healthier, less painful movement and activity.  An effective stretch should be held for at least 30 seconds.  A stretch should never be painful. You should only feel a gentle lengthening or release in the stretched tissue. STRETCH - Knee Extension, Prone  Lie on your stomach on a firm surface, such as a bed or countertop. Place your right / left knee and leg just beyond the edge of the surface. You may wish to place a towel under the far end of your right / left thigh for comfort.  Relax your leg muscles and allow gravity to straighten your knee. Your clinician may advise you to add an ankle weight if more resistance is helpful for you.  You should feel a stretch in the back of your right / left knee. Hold this position for __________ seconds. Repeat __________ times. Complete this stretch __________ times per day. * Your physician, physical therapist, or athletic trainer may ask you to add ankle weight to enhance your stretch.  RANGE OF MOTION - Knee Flexion, Active  Lie on your back with both knees straight. (If this causes back discomfort, bend your opposite knee, placing your foot flat on the floor.)  Slowly slide your heel back toward your buttocks until you feel a gentle stretch in the front of your knee or thigh.  Hold for __________ seconds. Slowly slide your heel back to the starting position. Repeat __________ times. Complete this exercise __________ times per day.  STRETCH - Quadriceps, Prone   Lie on your stomach on a firm surface, such as a bed or padded floor.  Bend your  right / left knee and grasp your ankle. If you are unable to reach your ankle or pant leg, use a belt around your foot to lengthen your reach.  Gently pull your heel toward your buttocks. Your knee should not slide out to the side. You should feel a stretch in the front of your thigh and/or knee.  Hold this position for __________ seconds. Repeat __________ times. Complete this stretch __________ times per day.  STRETCH - Hamstrings, Supine   Lie on your back. Loop a belt or towel over the ball of your right / left foot.  Straighten your right / left knee and slowly pull on the belt to raise your leg. Do not allow the right / left knee to bend. Keep your opposite leg flat on the floor.  Raise the leg until you feel a gentle stretch behind your right / left knee or thigh. Hold this position for __________ seconds. Repeat __________ times. Complete this stretch __________ times per day.  STRENGTHENING EXERCISES These exercises may help you when beginning to rehabilitate your injury. They may resolve your symptoms with or without further involvement from your physician, physical therapist, or athletic trainer. While completing these exercises, remember:   Muscles can gain both the endurance and the strength needed for everyday activities through controlled exercises.  Complete these exercises as instructed by your physician, physical therapist, or athletic trainer. Progress the resistance and repetitions only as guided.  You may experience muscle soreness or fatigue, but the pain or discomfort you are trying to   eliminate should never worsen during these exercises. If this pain does worsen, stop and make certain you are following the directions exactly. If the pain is still present after adjustments, discontinue the exercise until you can discuss the trouble with your clinician. STRENGTH - Quadriceps, Isometrics  Lie on your back with your right / left leg extended and your opposite knee  bent.  Gradually tense the muscles in the front of your right / left thigh. You should see either your knee cap slide up toward your hip or increased dimpling just above the knee. This motion will push the back of the knee down toward the floor/mat/bed on which you are lying.  Hold the muscle as tight as you can without increasing your pain for __________ seconds.  Relax the muscles slowly and completely in between each repetition. Repeat __________ times. Complete this exercise __________ times per day.  STRENGTH - Quadriceps, Short Arcs   Lie on your back. Place a __________ inch towel roll under your knee so that the knee slightly bends.  Raise only your lower leg by tightening the muscles in the front of your thigh. Do not allow your thigh to rise.  Hold this position for __________ seconds. Repeat __________ times. Complete this exercise __________ times per day.  OPTIONAL ANKLE WEIGHTS: Begin with ____________________, but DO NOT exceed ____________________. Increase in 1 pound/0.5 kilogram increments.  STRENGTH - Quadriceps, Straight Leg Raises  Quality counts! Watch for signs that the quadriceps muscle is working to insure you are strengthening the correct muscles and not "cheating" by substituting with healthier muscles.  Lay on your back with your right / left leg extended and your opposite knee bent.  Tense the muscles in the front of your right / left thigh. You should see either your knee cap slide up or increased dimpling just above the knee. Your thigh may even quiver.  Tighten these muscles even more and raise your leg 4 to 6 inches off the floor. Hold for __________ seconds.  Keeping these muscles tense, lower your leg.  Relax the muscles slowly and completely in between each repetition. Repeat __________ times. Complete this exercise __________ times per day.  STRENGTH - Hamstring, Curls  Lay on your stomach with your legs extended. (If you lay on a bed, your feet  may hang over the edge.)  Tighten the muscles in the back of your thigh to bend your right / left knee up to 90 degrees. Keep your hips flat on the bed/floor.  Hold this position for __________ seconds.  Slowly lower your leg back to the starting position. Repeat __________ times. Complete this exercise __________ times per day.  OPTIONAL ANKLE WEIGHTS: Begin with ____________________, but DO NOT exceed ____________________. Increase in 1 pound/0.5 kilogram increments.  STRENGTH - Quadriceps, Squats  Stand in a door frame so that your feet and knees are in line with the frame.  Use your hands for balance, not support, on the frame.  Slowly lower your weight, bending at the hips and knees. Keep your lower legs upright so that they are parallel with the door frame. Squat only within the range that does not increase your knee pain. Never let your hips drop below your knees.  Slowly return upright, pushing with your legs, not pulling with your hands. Repeat __________ times. Complete this exercise __________ times per day.  STRENGTH - Quadriceps, Wall Slides  Follow guidelines for form closely. Increased knee pain often results from poorly placed feet or knees.    Lean against a smooth wall or door and walk your feet out 18-24 inches. Place your feet hip-width apart.  Slowly slide down the wall or door until your knees bend __________ degrees.* Keep your knees over your heels, not your toes, and in line with your hips, not falling to either side.  Hold for __________ seconds. Stand up to rest for __________ seconds in between each repetition. Repeat __________ times. Complete this exercise __________ times per day. * Your physician, physical therapist, or athletic trainer will alter this angle based on your symptoms and progress.   This information is not intended to replace advice given to you by your health care provider. Make sure you discuss any questions you have with your health care  provider.   Document Released: 01/14/2005 Document Revised: 03/23/2014 Document Reviewed: 06/14/2008 Elsevier Interactive Patient Education 2016 [employer]  

## 2015-05-03 NOTE — Assessment & Plan Note (Signed)
Significantly better following course of prednisone. Continue exercises. To see orthopedist. Call with any problems.

## 2015-05-03 NOTE — Progress Notes (Signed)
BP 149/90 mmHg  Pulse 73  Temp(Src) 98.5 F (36.9 C)  Ht 5' 0.4" (1.534 m)  Wt 176 lb (79.833 kg)  BMI 33.93 kg/m2  SpO2 98%   Subjective:    Patient ID: Brandi Acevedo, female    DOB: [birth year], 50 y.o.   MRN: [id number]  HPI: Brandi Acevedo is a 50 y.o. female  Chief Complaint  Patient presents with  . Knee Pain    Right   KNEE PAIN- Took the prednisone and is feeling much better. Still feeling a little tender, but much better than it has been. Has not heard from the orthopedist Duration: Got better after acting up in June, then started again about a month ago Involved knee: right Mechanism of injury: unknown Location:medial Onset: sudden Severity: mild  Quality: aching Frequency: constant Radiation: no Aggravating factors: weight bearing, walking, running, stairs and bending  Alleviating factors: ice, NSAIDs and brace, prednisone  Status: worse Treatments attempted: rest, ice, aleve and HEP, prednisone  Relief with NSAIDs?: no Weakness with weight bearing or walking: no Sensation of giving way: yes Locking: yes Popping: no Bruising: no Swelling: no Redness: no Paresthesias/decreased sensation: no Fevers: no  UPPER RESPIRATORY TRACT INFECTION Duration: 1 day Worst symptom: ear pain Fever: no Cough: no Shortness of breath: no Wheezing: no Chest pain: no Chest tightness: no Chest congestion: no Nasal congestion: yes Runny nose: yes Post nasal drip: no Sneezing: no Sore throat: no Swollen glands: no Sinus pressure: no Headache: no Face pain: no Toothache: no Ear pain: yes "right Ear pressure: no  Eyes red/itching:no Eye drainage/crusting: no  Vomiting: no Rash: no Fatigue: no Sick contacts: yes Strep contacts: no  Context: stable Recurrent sinusitis: no Relief with OTC cold/cough medications: no  Treatments attempted: none    Relevant past medical, surgical, family and social history reviewed and updated as indicated. Interim  medical history since our last visit reviewed. Allergies and medications reviewed and updated.  Review of Systems  Constitutional: Negative.   HENT: Negative.   Respiratory: Negative.   Cardiovascular: Negative.   Musculoskeletal: Positive for arthralgias. Negative for myalgias, back pain, joint swelling, gait problem, neck pain and neck stiffness.  Psychiatric/Behavioral: Negative.     Per HPI unless specifically indicated above     Objective:    BP 149/90 mmHg  Pulse 73  Temp(Src) 98.5 F (36.9 C)  Ht 5' 0.4" (1.534 m)  Wt 176 lb (79.833 kg)  BMI 33.93 kg/m2  SpO2 98%  Wt Readings from Last 3 Encounters:  05/03/15 176 lb (79.833 kg)  04/25/15 173 lb (78.472 kg)  03/28/15 169 lb (76.658 kg)    Physical Exam  Constitutional: She is oriented to person, place, and time. She appears well-developed and well-nourished. No distress.  HENT:  Head: Normocephalic and atraumatic.  Right Ear: Hearing, external ear and ear canal normal. Tympanic membrane is not injected, not scarred, not perforated, not erythematous, not retracted and not bulging. A middle ear effusion is present.  Left Ear: Hearing, external ear and ear canal normal. Tympanic membrane is not injected, not scarred, not perforated, not erythematous, not retracted and not bulging. A middle ear effusion is present.  Nose: Nose normal.  Mouth/Throat: Oropharynx is clear and moist. No oropharyngeal exudate.  Eyes: Conjunctivae, EOM and lids are normal. Pupils are equal, round, and reactive to light. Right eye exhibits no discharge. Left eye exhibits no discharge. No scleral icterus.  Neck: Normal range of motion. Neck supple. No JVD present. No tracheal  deviation present. No thyromegaly present.  Cardiovascular: Normal rate, regular rhythm, normal heart sounds and intact distal pulses.  Exam reveals no gallop and no friction rub.   No murmur heard. Pulmonary/Chest: Effort normal and breath sounds normal. No stridor. No  respiratory distress. She has no wheezes. She has no rales. She exhibits no tenderness.  Musculoskeletal: Normal range of motion. She exhibits no edema or tenderness.  Lymphadenopathy:    She has cervical adenopathy.  Neurological: She is alert and oriented to person, place, and time.  Skin: Skin is warm, dry and intact. No rash noted. She is not diaphoretic. No erythema. No pallor.  Psychiatric: She has a normal mood and affect. Her speech is normal and behavior is normal. Judgment and thought content normal. Cognition and memory are normal.  Nursing note and vitals reviewed.   Results for orders placed or performed in visit on 04/25/15  Uric acid  Result Value Ref Range   Uric Acid 7.3 (H) 2.5 - 7.1 mg/dL  Lyme Ab/Western Blot Reflex  Result Value Ref Range   Lyme IgG/IgM Ab <0.91 0.00 - 0.90 ISR   LYME DISEASE AB, QUANT, IGM <0.80 0.00 - 0.79 index  Rocky mtn spotted fvr abs pnl(IgG+IgM)  Result Value Ref Range   RMSF IgG Negative Negative   RMSF IgM 0.23 0.00 - [id number] index  Ehrlichia Antibody Panel  Result Value Ref Range   E.Chaffeensis ([facility]) IgG Negative Neg:<1:64   E. Chaffeensis ([facility]) IgM Titer Negative Neg:<1:20   HGE IgG Titer Negative Neg:<1:64   [facility] IgM Titer Negative Neg:<1:20  Babesia microti Antibody Panel  Result Value Ref Range   Babesia microti IgM <1:10 Neg:<1:10   Babesia microti IgG <1:10 Neg:<1:10      Assessment & Plan:   Problem List Items Addressed This Visit      Other   Recurrent knee pain - Primary    Significantly better following course of prednisone. Continue exercises. To see orthopedist. Call with any problems.        Other Visit Diagnoses    Middle ear effusion, bilateral        No sign of infection. Will take sudafed. Call if pain increases or starts running fever and we will treat for AOM given her effusions.         Follow up plan: Return As scheduled.

## 2015-05-15 DIAGNOSIS — Y9289 Other specified places as the place of occurrence of the external cause: Secondary | ICD-10-CM | POA: Insufficient documentation

## 2015-05-15 DIAGNOSIS — Z79899 Other long term (current) drug therapy: Secondary | ICD-10-CM | POA: Diagnosis not present

## 2015-05-15 DIAGNOSIS — F329 Major depressive disorder, single episode, unspecified: Secondary | ICD-10-CM | POA: Insufficient documentation

## 2015-05-15 DIAGNOSIS — Y998 Other external cause status: Secondary | ICD-10-CM | POA: Diagnosis not present

## 2015-05-15 DIAGNOSIS — R45851 Suicidal ideations: Secondary | ICD-10-CM | POA: Insufficient documentation

## 2015-05-15 DIAGNOSIS — F17210 Nicotine dependence, cigarettes, uncomplicated: Secondary | ICD-10-CM | POA: Insufficient documentation

## 2015-05-15 DIAGNOSIS — S62316A Displaced fracture of base of fifth metacarpal bone, right hand, initial encounter for closed fracture: Secondary | ICD-10-CM | POA: Insufficient documentation

## 2015-05-15 DIAGNOSIS — Y9389 Activity, other specified: Secondary | ICD-10-CM | POA: Insufficient documentation

## 2015-05-15 DIAGNOSIS — F99 Mental disorder, not otherwise specified: Secondary | ICD-10-CM | POA: Diagnosis present

## 2015-05-15 DIAGNOSIS — W2209XA Striking against other stationary object, initial encounter: Secondary | ICD-10-CM | POA: Diagnosis not present

## 2015-05-15 DIAGNOSIS — Z791 Long term (current) use of non-steroidal anti-inflammatories (NSAID): Secondary | ICD-10-CM | POA: Insufficient documentation

## 2015-05-15 DIAGNOSIS — F322 Major depressive disorder, single episode, severe without psychotic features: Secondary | ICD-10-CM | POA: Diagnosis not present

## 2015-05-16 ENCOUNTER — Emergency Department
Admission: EM | Admit: 2015-05-16 | Discharge: 2015-05-16 | Disposition: A | Discharge status: Other Acute Inpatient Hospital | Payer: BLUE CROSS/BLUE SHIELD | Attending: Emergency Medicine | Admitting: Emergency Medicine

## 2015-05-16 ENCOUNTER — Emergency Department: Payer: BLUE CROSS/BLUE SHIELD

## 2015-05-16 ENCOUNTER — Encounter: Payer: Self-pay | Admitting: Emergency Medicine

## 2015-05-16 DIAGNOSIS — IMO0002 Reserved for concepts with insufficient information to code with codable children: Secondary | ICD-10-CM

## 2015-05-16 DIAGNOSIS — F322 Major depressive disorder, single episode, severe without psychotic features: Secondary | ICD-10-CM

## 2015-05-16 DIAGNOSIS — R45851 Suicidal ideations: Secondary | ICD-10-CM

## 2015-05-16 DIAGNOSIS — F32A Depression, unspecified: Secondary | ICD-10-CM

## 2015-05-16 DIAGNOSIS — S62339A Displaced fracture of neck of unspecified metacarpal bone, initial encounter for closed fracture: Secondary | ICD-10-CM

## 2015-05-16 DIAGNOSIS — Z7289 Other problems related to lifestyle: Secondary | ICD-10-CM

## 2015-05-16 DIAGNOSIS — F329 Major depressive disorder, single episode, unspecified: Secondary | ICD-10-CM

## 2015-05-16 HISTORY — DX: Suicidal ideations: R45.851

## 2015-05-16 HISTORY — DX: Reserved for concepts with insufficient information to code with codable children: IMO0002

## 2015-05-16 LAB — COMPREHENSIVE METABOLIC PANEL
ALBUMIN: 3.9 g/dL (ref 3.5–5.0)
ALT: 25 U/L (ref 14–54)
ANION GAP: 6 (ref 5–15)
AST: 18 U/L (ref 15–41)
Alkaline Phosphatase: 65 U/L (ref 38–126)
BUN: 13 mg/dL (ref 6–20)
CO2: 28 mmol/L (ref 22–32)
Calcium: 8.9 mg/dL (ref 8.9–10.3)
Chloride: 105 mmol/L (ref 101–111)
Creatinine, Ser: 0.79 mg/dL (ref 0.44–1.00)
GFR calc non Af Amer: >60 mL/min (ref >60)
GLUCOSE: 124 mg/dL — AB (ref 65–99)
POTASSIUM: 3.6 mmol/L (ref 3.5–5.1)
SODIUM: 139 mmol/L (ref 135–145)
Total Bilirubin: 0.2 mg/dL — ABNORMAL LOW (ref 0.3–1.2)
Total Protein: 7.3 g/dL (ref 6.5–8.1)

## 2015-05-16 LAB — URINE DRUG SCREEN, QUALITATIVE (ARMC ONLY)
Amphetamines, Ur Screen: NOT DETECTED
BARBITURATES, UR SCREEN: NOT DETECTED
BENZODIAZEPINE, UR SCRN: NOT DETECTED
CANNABINOID 50 NG, UR ~~LOC~~: NOT DETECTED
Cocaine Metabolite,Ur ~~LOC~~: NOT DETECTED
MDMA (Ecstasy)Ur Screen: NOT DETECTED
Methadone Scn, Ur: NOT DETECTED
OPIATE, UR SCREEN: NOT DETECTED
PHENCYCLIDINE (PCP) UR S: NOT DETECTED
Tricyclic, Ur Screen: NOT DETECTED

## 2015-05-16 LAB — URINALYSIS COMPLETE WITH MICROSCOPIC (ARMC ONLY)
BILIRUBIN URINE: NEGATIVE
Bacteria, UA: NONE SEEN
GLUCOSE, UA: NEGATIVE mg/dL
Hgb urine dipstick: NEGATIVE
KETONES UR: NEGATIVE mg/dL
Leukocytes, UA: NEGATIVE
Nitrite: NEGATIVE
PH: 6 (ref 5.0–8.0)
Protein, ur: NEGATIVE mg/dL
Specific Gravity, Urine: 1.008 (ref 1.005–1.030)

## 2015-05-16 LAB — ETHANOL: Alcohol, Ethyl (B): <5 mg/dL (ref <5)

## 2015-05-16 LAB — ACETAMINOPHEN LEVEL

## 2015-05-16 LAB — CBC
HEMATOCRIT: 39.4 % (ref 35.0–47.0)
HEMOGLOBIN: 13.3 g/dL (ref 12.0–16.0)
MCH: 30.1 pg (ref 26.0–34.0)
MCHC: 33.7 g/dL (ref 32.0–36.0)
MCV: 89.2 fL (ref 80.0–100.0)
PLATELETS: 309 10*3/uL (ref 150–440)
RBC: 4.42 MIL/uL (ref 3.80–5.20)
RDW: 14.1 % (ref 11.5–14.5)
WBC: 10 10*3/uL (ref 3.6–11.0)

## 2015-05-16 LAB — SALICYLATE LEVEL

## 2015-05-16 MED ORDER — IBUPROFEN 800 MG PO TABS
800.0000 mg | ORAL_TABLET | Freq: Once | ORAL | Status: AC
  Administered 2015-05-16: 800 mg via ORAL

## 2015-05-16 MED ORDER — BUPROPION HCL 100 MG PO TABS
100.0000 mg | ORAL_TABLET | Freq: Two times a day (BID) | ORAL | Status: DC
  Administered 2015-05-16: 100 mg via ORAL
  Filled 2015-05-16 (×2): qty 1

## 2015-05-16 MED ORDER — LORAZEPAM 1 MG PO TABS
1.0000 mg | ORAL_TABLET | Freq: Once | ORAL | Status: AC
  Administered 2015-05-16: 1 mg via ORAL
  Filled 2015-05-16: qty 1

## 2015-05-16 MED ORDER — CITALOPRAM HYDROBROMIDE 20 MG PO TABS: 20.0000 mg | ORAL_TABLET | Freq: Every day | ORAL | Status: DC

## 2015-05-16 MED ORDER — IBUPROFEN 800 MG PO TABS
ORAL_TABLET | ORAL | Status: AC
  Filled 2015-05-16: qty 1

## 2015-05-16 MED ORDER — PANTOPRAZOLE SODIUM 40 MG PO TBEC
DELAYED_RELEASE_TABLET | ORAL | Status: AC
  Administered 2015-05-16: 40 mg via ORAL
  Filled 2015-05-16: qty 1

## 2015-05-16 MED ORDER — CITALOPRAM HYDROBROMIDE 20 MG PO TABS
20.0000 mg | ORAL_TABLET | Freq: Every day | ORAL | Status: DC
  Administered 2015-05-16: 20 mg via ORAL
  Filled 2015-05-16: qty 1

## 2015-05-16 MED ORDER — ROPINIROLE HCL 0.25 MG PO TABS
0.2500 mg | ORAL_TABLET | Freq: Every day | ORAL | Status: DC
  Administered 2015-05-16: 0.25 mg via ORAL
  Filled 2015-05-16 (×2): qty 1

## 2015-05-16 MED ORDER — PANTOPRAZOLE SODIUM 40 MG PO TBEC
40.0000 mg | DELAYED_RELEASE_TABLET | Freq: Every day | ORAL | Status: DC
  Administered 2015-05-16: 40 mg via ORAL
  Filled 2015-05-16: qty 1

## 2015-05-16 NOTE — Consult Note (Signed)
[facility] Consult   Reason for Consult:  Consult for this 50 year old woman who presented at the referral of mobile crisis because of depression and suicidal thoughts Referring Physician:  Joni Fears Patient Identification: Brandi Acevedo MRN:  [id number] Principal Diagnosis: Depression, major, single episode, severe ([place]) Diagnosis:   Patient Active Problem List   Diagnosis Date Noted  . Depression, major, single episode, severe ([place]) [F32.2] 05/16/2015  . Suicidal ideation [R45.851] 05/16/2015  . Self-inflicted injury [U72.5] 05/16/2015  . Recurrent knee pain [M25.569] 04/25/2015  . RLS (restless legs syndrome) [G25.81] 02/26/2015  . Tobacco abuse [Z72.0] 02/26/2015  . IBS (irritable bowel syndrome) [K58.9] 02/26/2015  . GERD (gastroesophageal reflux disease) [K21.9] 02/26/2015  . Sleep apnea [G47.30]     Total Time spent with patient: 1 hour  Subjective:   Brandi Acevedo is a 50 y.o. female patient admitted with "I'm just tired of feeling this way".  HPI:  Patient interviewed. Chart reviewed. Old chart reviewed. Labs and vitals reviewed. 50 year old woman presented after calling mobile crisis yesterday. Told him that she was very depressed and having some thoughts about maybe hurting herself. In interview today the patient says that for about 2 weeks she has felt very depressed. Negative and down most of the time. Her sleep has been okay because she takes Requip at night and that helps her to sleep well. Her appetite is probably actually been a little increased because ever since she stopped smoking she's been eating more. It varies from time to time. Major stresses conflict with her daughter. She has 2 daughters and one of them she thinks is somehow involved in drug problems and she is worried that it is going to result in collect of her granddaughter. Patient has tried to intervene with her daughter and has been rebuffed and cursed that. That was the acute stressor  for her. Patient says she was having thoughts that she would hurt herself but denies having a specific intent or plan. She says that she was sitting in her car yesterday and called mobile crisis. After coming into the emergency room she was put on commitment petition and she got very angry and tried to leave. This resulted in her punching a wall and injuring her arm. Patient denies that she's drinking alcohol or abusing drugs. She is taking bupropion I believe 100 mg twice a day and has been on that for a month or so in an attempt to help her quit smoking. She doesn't report any other medication changes anytime recently and is not on any treatment for depression. Does not report any psychotic symptoms.  Social history: Patient lives by herself. She has 2 or 3 adult children it sounds like 2 daughters and a son. Gets along okay with one of her daughters and one of her sons but the younger daughter is driving her crazy with her behavior. Patient works at [employer] and is very afraid of losing her job if she is out for more than a day.  Medical history: Patient has an acute injury to her right forearm currently in an Ace bandage. That can be dealt with at discharge from the emergency room. She has a history of restless leg syndrome and is taking Requip. She has a history of pain in her knee which had been a recent focus of attention with her primary care doctor. She had gotten a workup for rheumatologic problems that doesn't seem to be pointing to any solution. She had a short course of steroids  about a month ago but that was several weeks ago and she is not currently on any. Has gastric reflux symptoms.   Substance abuse history: Patient says she hasn't had any alcohol or drugs in 18 years. There is nothing in her chart currently about substance abuse history. I didn't go into whether there was a major problem years ago but it sounds like it's under control.  Past Psychiatric History: Patient has never been in a  psychiatric hospital. Doesn't recall being on any other medicine for depression. The current bupropion was apparently targeting her smoking and not depression. No past history of suicide attempts.  Risk to Self: Suicidal Ideation: No Suicidal Intent: No Is patient at risk for suicide?: No Suicidal Plan?: No Access to Means: No What has been your use of drugs/alcohol within the last 12 months?: None identified How many times?: 0 Other Self Harm Risks: None identified Triggers for Past Attempts: None known Intentional Self Injurious Behavior: None Risk to Others: Homicidal Ideation: No Thoughts of Harm to Others: No Current Homicidal Intent: No Current Homicidal Plan: No Access to Homicidal Means: No Identified Victim: None identified History of harm to others?: No Assessment of Violence: None Noted Violent Behavior Description: None identified Does patient have access to weapons?: No Criminal Charges Pending?: No Does patient have a court date: No Prior Inpatient Therapy: Prior Inpatient Therapy: No Prior Therapy Dates: N/A Prior Therapy Facilty/Provider(s): N/A Reason for Treatment: N/A Prior Outpatient Therapy: Prior Outpatient Therapy: No Prior Therapy Dates: N/A Prior Therapy Facilty/Provider(s): N/A Reason for Treatment: N/A Does patient have an ACCT team?: No Does patient have Intensive In-House Services?  : No Does patient have [facility] services? : No Does patient have P4CC services?: No  Past Medical History:  Past Medical History  Diagnosis Date  . COPD (chronic obstructive pulmonary disease) ([place])   . Hearing loss   . RLS (restless legs syndrome)   . IBS (irritable bowel syndrome)   . Heart attack ([place])   . Sleep apnea     Past Surgical History  Procedure Laterality Date  . Abdominal hysterectomy    . Tympanostomy tube placement      X 2  . Tonsillectomy    . Dilation and curettage of uterus    . Achilles tendon repair    . Cholecystectomy    . Cesarean  section      X 3  . Hernia repair     Family History:  Family History  Problem Relation Age of Onset  . Diabetes Mother   . Restless legs syndrome Mother   . Sleep apnea Mother   . Hypertension Mother   . Hyperlipidemia Mother   . Diabetes Father   . Hypertension Father   . Hyperlipidemia Father   . Heart disease Father   . Heart attack Father   . Diabetes Sister   . Hyperlipidemia Sister   . Hypertension Sister   . Heart disease Sister   . Heart attack Sister   . Hyperlipidemia Daughter   . Mental illness Daughter     Bipolar Disorder  . Heart disease Maternal Grandmother   . Stroke Maternal Grandfather   . Heart attack Maternal Grandfather   . Cancer Paternal Grandmother   . Hyperlipidemia Daughter    Family Psychiatric  History: Patient thinks that her daughter the one who is driving her crazy has mood problems and might be "bipolar" but that's anecdotal. Social History:  History  Alcohol Use No  History  Drug Use No    Social History   Social History  . Marital Status: Single    Spouse Name: N/A  . Number of Children: N/A  . Years of Education: N/A   Social History Main Topics  . Smoking status: Current Every Day Smoker -- 0.25 packs/day    Types: Cigarettes  . Smokeless tobacco: Never Used  . Alcohol Use: No  . Drug Use: No  . Sexual Activity: Not Currently   Other Topics Concern  . None   Social History Narrative   Additional Social History:    Allergies:   Allergies  Allergen Reactions  . Codeine     Gi upset  . Flagyl [Metronidazole] Rash  . Keflex [Cephalexin] Rash  . Septra [Sulfamethoxazole-Trimethoprim] Rash    Labs:  Results for orders placed or performed during the hospital encounter of 05/16/15 (from the past 48 hour(s))  Comprehensive metabolic panel     Status: Abnormal   Collection Time: 05/16/15 12:13 AM  Result Value Ref Range   Sodium 139 135 - 145 mmol/L   Potassium 3.6 3.5 - 5.1 mmol/L   Chloride 105 101 - 111  mmol/L   CO2 28 22 - 32 mmol/L   Glucose, Bld 124 (H) 65 - 99 mg/dL   BUN 13 6 - 20 mg/dL   Creatinine, Ser 0.79 0.44 - 1.00 mg/dL   Calcium 8.9 8.9 - 10.3 mg/dL   Total Protein 7.3 6.5 - 8.1 g/dL   Albumin 3.9 3.5 - 5.0 g/dL   AST 18 15 - 41 U/L   ALT 25 14 - 54 U/L   Alkaline Phosphatase 65 38 - 126 U/L   Total Bilirubin 0.2 (L) 0.3 - 1.2 mg/dL   GFR calc non Af Amer >60 >60 mL/min   GFR calc Af Amer >60 >60 mL/min    Comment: (NOTE) The eGFR has been calculated using the CKD EPI equation. This calculation has not been validated in all clinical situations. eGFR's persistently <60 mL/min signify possible Chronic Kidney Disease.    Anion gap 6 5 - 15  Ethanol (ETOH)     Status: None   Collection Time: 05/16/15 12:13 AM  Result Value Ref Range   Alcohol, Ethyl (B) <5 <5 mg/dL    Comment:        LOWEST DETECTABLE LIMIT FOR SERUM ALCOHOL IS 5 mg/dL FOR MEDICAL PURPOSES ONLY   Salicylate level     Status: None   Collection Time: 05/16/15 12:13 AM  Result Value Ref Range   Salicylate Lvl <2.9 2.8 - 30.0 mg/dL  Acetaminophen level     Status: Abnormal   Collection Time: 05/16/15 12:13 AM  Result Value Ref Range   Acetaminophen (Tylenol), Serum <10 (L) 10 - 30 ug/mL    Comment:        THERAPEUTIC CONCENTRATIONS VARY SIGNIFICANTLY. A RANGE OF 10-30 ug/mL MAY BE AN EFFECTIVE CONCENTRATION FOR MANY PATIENTS. HOWEVER, SOME ARE BEST TREATED AT CONCENTRATIONS OUTSIDE THIS RANGE. ACETAMINOPHEN CONCENTRATIONS >150 ug/mL AT 4 HOURS AFTER INGESTION AND >50 ug/mL AT 12 HOURS AFTER INGESTION ARE OFTEN ASSOCIATED WITH TOXIC REACTIONS.   CBC     Status: None   Collection Time: 05/16/15 12:13 AM  Result Value Ref Range   WBC 10.0 3.6 - 11.0 K/uL   RBC 4.42 3.80 - 5.20 MIL/uL   Hemoglobin 13.3 12.0 - 16.0 g/dL   HCT 39.4 35.0 - 47.0 %   MCV 89.2 80.0 - 100.0 fL   MCH  30.1 26.0 - 34.0 pg   MCHC 33.7 32.0 - 36.0 g/dL   RDW 14.1 11.5 - 14.5 %   Platelets 309 150 - 440 K/uL   Urine Drug Screen, Qualitative ([facility] only)     Status: None   Collection Time: 05/16/15 12:13 AM  Result Value Ref Range   Tricyclic, Ur Screen NONE DETECTED NONE DETECTED   Amphetamines, Ur Screen NONE DETECTED NONE DETECTED   MDMA (Ecstasy)Ur Screen NONE DETECTED NONE DETECTED   Cocaine Metabolite,Ur [place] NONE DETECTED NONE DETECTED   Opiate, Ur Screen NONE DETECTED NONE DETECTED   Phencyclidine (PCP) Ur S NONE DETECTED NONE DETECTED   Cannabinoid 50 Ng, Ur  NONE DETECTED NONE DETECTED   Barbiturates, Ur Screen NONE DETECTED NONE DETECTED   Benzodiazepine, Ur Scrn NONE DETECTED NONE DETECTED   Methadone Scn, Ur NONE DETECTED NONE DETECTED    Comment: (NOTE) 846  Tricyclics, urine               Cutoff 1000 ng/mL 200  Amphetamines, urine             Cutoff 1000 ng/mL 300  MDMA (Ecstasy), urine           Cutoff 500 ng/mL 400  Cocaine Metabolite, urine       Cutoff 300 ng/mL 500  Opiate, urine                   Cutoff 300 ng/mL 600  Phencyclidine (PCP), urine      Cutoff 25 ng/mL 700  Cannabinoid, urine              Cutoff 50 ng/mL 800  Barbiturates, urine             Cutoff 200 ng/mL 900  Benzodiazepine, urine           Cutoff 200 ng/mL 1000 Methadone, urine                Cutoff 300 ng/mL 1100 1200 The urine drug screen provides only a preliminary, unconfirmed 1300 analytical test result and should not be used for non-medical 1400 purposes. Clinical consideration and professional judgment should 1500 be applied to any positive drug screen result due to possible 1600 interfering substances. A more specific alternate chemical method 1700 must be used in order to obtain a confirmed analytical result.  1800 Gas chromato graphy / mass spectrometry (GC/MS) is the preferred 1900 confirmatory method.   Urinalysis complete, with microscopic ([facility] only)     Status: Abnormal   Collection Time: 05/16/15 12:13 AM  Result Value Ref Range   Color, Urine STRAW (A) YELLOW   APPearance CLEAR  (A) CLEAR   Glucose, UA NEGATIVE NEGATIVE mg/dL   Bilirubin Urine NEGATIVE NEGATIVE   Ketones, ur NEGATIVE NEGATIVE mg/dL   Specific Gravity, Urine 1.008 1.005 - 1.030   Hgb urine dipstick NEGATIVE NEGATIVE   pH 6.0 5.0 - 8.0   Protein, ur NEGATIVE NEGATIVE mg/dL   Nitrite NEGATIVE NEGATIVE   Leukocytes, UA NEGATIVE NEGATIVE   RBC / HPF 0-5 0 - 5 RBC/hpf   WBC, UA 0-5 0 - 5 WBC/hpf   Bacteria, UA NONE SEEN NONE SEEN   Squamous Epithelial / LPF 0-5 (A) NONE SEEN   Mucous PRESENT     Current Facility-Administered Medications  Medication Dose Route Frequency Provider Last Rate Last Dose  . buPROPion [facility]) tablet 100 mg  100 mg Oral BID Paulette Blanch, MD   100 mg at 05/16/15 0947  .  citalopram (CELEXA) tablet 20 mg  20 mg Oral Daily Gonzella Lex, MD      . ibuprofen (ADVIL,MOTRIN) 800 MG tablet           . pantoprazole (PROTONIX) EC tablet 40 mg  40 mg Oral QHS Paulette Blanch, MD   40 mg at 05/16/15 0302  . rOPINIRole (REQUIP) tablet 0.25 mg  0.25 mg Oral QHS Paulette Blanch, MD   0.25 mg at 05/16/15 1324   Current Outpatient Prescriptions  Medication Sig Dispense Refill  . buPROPion (WELLBUTRIN) 100 MG tablet 1 tab daily for the first 3 days, then 1 tab BID, pick a quit date in the 2nd week (Patient taking differently: Take 100 mg by mouth 2 (two) times daily. ) 60 tablet 3  . naproxen (NAPROSYN) 500 MG tablet Take 1 tablet (500 mg total) by mouth 2 (two) times daily with a meal. 60 tablet 1  . pantoprazole (PROTONIX) 40 MG tablet Take 1 tablet (40 mg total) by mouth daily. 30 tablet 3  . rOPINIRole (REQUIP) 0.25 MG tablet Take 1 tablet (0.25 mg total) by mouth at bedtime. 30 tablet 3  . citalopram (CELEXA) 20 MG tablet Take 1 tablet (20 mg total) by mouth daily. 30 tablet 0  . dicyclomine (BENTYL) 20 MG tablet Take 1 tablet (20 mg total) by mouth every 6 (six) hours. (Patient not taking: Reported on 05/16/2015) 30 tablet 1  . predniSONE (DELTASONE) 50 MG tablet Take 1 tablet (50 mg  total) by mouth daily with breakfast. (Patient not taking: Reported on 05/16/2015) 5 tablet 0    Musculoskeletal: Strength & Muscle Tone: within normal limits Gait & Station: normal Patient leans: N/A  Psychiatric Specialty Exam: Review of Systems  Constitutional: Negative.   HENT: Negative.   Eyes: Negative.   Respiratory: Negative.   Cardiovascular: Negative.   Gastrointestinal: Negative.   Musculoskeletal: Negative.        Patient has an injury to her right forearm and wrist that is taped up with an Ace bandage. Resulted from punching a wall in the emergency room last night.  Skin: Negative.   Neurological: Negative.   Psychiatric/Behavioral: Positive for depression and suicidal ideas. Negative for hallucinations, memory loss and substance abuse. The patient is nervous/anxious. The patient does not have insomnia.     Blood pressure 128/61, pulse 70, temperature 98.1 F (36.7 C), temperature source Oral, resp. rate 18, height '5\' 1"'  (1.549 m), weight 76.658 kg (169 lb), SpO2 96 %.Body mass index is 31.95 kg/(m^2).  General Appearance: Disheveled  Eye Contact::  Minimal  Speech:  Slow  Volume:  Decreased  Mood:  Dysphoric  Affect:  Constricted  Thought Process:  Goal Directed  Orientation:  Full (Time, Place, and Person)  Thought Content:  Negative  Suicidal Thoughts:  Yes.  without intent/plan  Homicidal Thoughts:  No  Memory:  Immediate;   Good Recent;   Fair Remote;   Fair  Judgement:  Fair  Insight:  Fair  Psychomotor Activity:  Decreased  Concentration:  Fair  Recall:  [employer] of Knowledge:Fair  Language: Fair  Akathisia:  No  Handed:  Right  AIMS (if indicated):     Assets:  Communication Skills Desire for Improvement Financial Resources/Insurance Housing Resilience  ADL's:  Intact  Cognition: WNL  Sleep:      Treatment Plan Summary: Medication management and Plan 50 year old woman who does give a history of symptoms consistent with major depression.  Also there is a possibility  that some of this could be driven by her medication. I say this just because of the coincidence of her having started bupropion relatively recently and also because it sounds like there is quite a bit of anger and irritability as part of her depression. She doesn't apparently have a real past psychiatric history. Patient is not currently psychotic and although she had suicidal thoughts she did not act to harm herself. During interview today she is able to articulate several major reasons to not harm herself including her grandchildren and being optimistic about the future. Patient is able to articulate that if she were to have suicidal thoughts again she would call mobile crisis. She agrees to the plan I'm proposing which is to discontinue her Wellbutrin, initiate citalopram 20 mg a day, follow up with her primary care doctor at [facility] and receive information about [facility] and consider going for an intake to see a therapist. Case reviewed with emergency room doctor. I had considered admitting her to the hospital but the patient makes a good point about losing her job creating much more stress for her and is fully agreeable and appropriate about a treatment plan now. Suicidal ideation appears to be a controlled problem at this point and she is safe for discharge.  Disposition: Patient does not meet criteria for psychiatric inpatient admission. Supportive therapy provided about ongoing stressors. Discussed crisis plan, support from social network, calling 911, coming to the Emergency Department, and calling Suicide Hotline.  Alethia Berthold, MD 05/16/2015 11:57 AM

## 2015-05-16 NOTE — ED Notes (Addendum)
Pt refusing to remove undergarment, this RN and Marcie Bal, EDT witnessed pt pull down undergarment to verify pt did not have anything. Pt changed into scrub pants. MD notified.

## 2015-05-16 NOTE — ED Notes (Signed)
Pt is alert and oriented on admission. Pt mood is irritable/labile and her affect is sad. Pt denies SI/HI and AVH but became very agitated when staff requested her hair ties for safety reasons. Staff is patient and therapeutic, but she became very defiant and refused to relinquish her hair bands. Charge Surveyor, quantity with de-escalation and eventually she willingly gave them up to Probation officer. However, pt had already punched a wall in the bathroom prior to resolving the issue. Pt reports 10/10 pain and xray of right hand was completed. Writer provided ice and ibuprofen for the pain. Patient acknowledges that it was her own fault and is now calm and cooperative with staff, lying in bed watching TV. Fluids provided and 15 minute checks on going for safety.

## 2015-05-16 NOTE — ED Notes (Signed)
Pt refusing to remove undergarments, stating that she is going to leave; charge nurse notified and pt taken to room 22 prior to changing

## 2015-05-16 NOTE — ED Provider Notes (Signed)
[facility] Emergency Department Provider Note  ____________________________________________  Time seen: Approximately 12:35 AM  I have reviewed the triage vital signs and the nursing notes.   HISTORY  Chief Complaint Mental Health Problem    HPI Brandi Acevedo is a 49 y.o. female who presents to the ED from home with a chief complaint of depression and suicidal thoughts. Patient admits to overwhelming stress in her life. She is tearful and states "I don't feel like I should be here anymore".Denies AH/VH. Denies recent fever, chills, chest pain, shortness of breath, abdominal pain, nausea, vomiting, diarrhea. Denies recent travel or trauma. Nothing makes her symptoms better or worse.   Past Medical History  Diagnosis Date  . COPD (chronic obstructive pulmonary disease) ([place])   . Hearing loss   . RLS (restless legs syndrome)   . IBS (irritable bowel syndrome)   . Heart attack ([place])   . Sleep apnea     Patient Active Problem List   Diagnosis Date Noted  . Recurrent knee pain 04/25/2015  . RLS (restless legs syndrome) 02/26/2015  . Tobacco abuse 02/26/2015  . IBS (irritable bowel syndrome) 02/26/2015  . GERD (gastroesophageal reflux disease) 02/26/2015  . Sleep apnea     Past Surgical History  Procedure Laterality Date  . Abdominal hysterectomy    . Tympanostomy tube placement      X 2  . Tonsillectomy    . Dilation and curettage of uterus    . Achilles tendon repair    . Cholecystectomy    . Cesarean section      X 3  . Hernia repair      Current Outpatient Rx  Name  Route  Sig  Dispense  Refill  . buPROPion (WELLBUTRIN) 100 MG tablet      1 tab daily for the first 3 days, then 1 tab BID, pick a quit date in the 2nd week Patient taking differently: Take 100 mg by mouth 2 (two) times daily.    60 tablet   3   . naproxen (NAPROSYN) 500 MG tablet   Oral   Take 1 tablet (500 mg total) by mouth 2 (two) times daily with a meal.   60  tablet   1   . pantoprazole (PROTONIX) 40 MG tablet   Oral   Take 1 tablet (40 mg total) by mouth daily.   30 tablet   3   . rOPINIRole (REQUIP) 0.25 MG tablet   Oral   Take 1 tablet (0.25 mg total) by mouth at bedtime.   30 tablet   3   . dicyclomine (BENTYL) 20 MG tablet   Oral   Take 1 tablet (20 mg total) by mouth every 6 (six) hours. Patient not taking: Reported on 05/16/2015   30 tablet   1   . predniSONE (DELTASONE) 50 MG tablet   Oral   Take 1 tablet (50 mg total) by mouth daily with breakfast. Patient not taking: Reported on 05/16/2015   5 tablet   0     Allergies Codeine; Flagyl; Keflex; and Septra  Family History  Problem Relation Age of Onset  . Diabetes Mother   . Restless legs syndrome Mother   . Sleep apnea Mother   . Hypertension Mother   . Hyperlipidemia Mother   . Diabetes Father   . Hypertension Father   . Hyperlipidemia Father   . Heart disease Father   . Heart attack Father   . Diabetes Sister   .  Hyperlipidemia Sister   . Hypertension Sister   . Heart disease Sister   . Heart attack Sister   . Hyperlipidemia Daughter   . Mental illness Daughter     Bipolar Disorder  . Heart disease Maternal Grandmother   . Stroke Maternal Grandfather   . Heart attack Maternal Grandfather   . Cancer Paternal Grandmother   . Hyperlipidemia Daughter     Social History Social History  Substance Use Topics  . Smoking status: Current Every Day Smoker -- 0.25 packs/day    Types: Cigarettes  . Smokeless tobacco: Never Used  . Alcohol Use: No    Review of Systems  Constitutional: No fever/chills. Eyes: No visual changes. ENT: No sore throat. Cardiovascular: Denies chest pain. Respiratory: Denies shortness of breath. Gastrointestinal: No abdominal pain.  No nausea, no vomiting.  No diarrhea.  No constipation. Genitourinary: Negative for dysuria. Musculoskeletal: Negative for back pain. Skin: Negative for rash. Neurological: Negative for  headaches, focal weakness or numbness. Psychiatric:Positive for depression.  10-point ROS otherwise negative.  ____________________________________________   PHYSICAL EXAM:  VITAL SIGNS: ED Triage Vitals  Enc Vitals Group     BP 05/16/15 0006 154/115 mmHg     Pulse Rate 05/16/15 0006 88     Resp 05/16/15 0006 8     Temp 05/16/15 0006 98.1 F (36.7 C)     Temp Source 05/16/15 0006 Oral     SpO2 05/16/15 0006 97 %     Weight 05/16/15 0006 169 lb (76.658 kg)     Height 05/16/15 0006 5\' 1"  (1.549 m)     Head Cir --      Peak Flow --      Pain Score --      Pain Loc --      Pain Edu? --      Excl. in [place]? --     Constitutional: Alert and oriented. Well appearing and in mild acute distress. Tearful. Eyes: Conjunctivae are normal. PERRL. EOMI. Head: Atraumatic. Nose: No congestion/rhinnorhea. Mouth/Throat: Mucous membranes are moist.  Oropharynx non-erythematous. Neck: No stridor.   Cardiovascular: Normal rate, regular rhythm. Grossly normal heart sounds.  Good peripheral circulation. Respiratory: Normal respiratory effort.  No retractions. Lungs CTAB. Gastrointestinal: Soft and nontender. No distention. No abdominal bruits. No CVA tenderness. Musculoskeletal: No lower extremity tenderness nor edema.  No joint effusions. Neurologic:  Normal speech and language. No gross focal neurologic deficits are appreciated. No gait instability. Skin:  Skin is warm, dry and intact. No rash noted. Psychiatric: Mood and affect are tearful. Speech and behavior are normal.  ____________________________________________   LABS (all labs ordered are listed, but only abnormal results are displayed)  Labs Reviewed  COMPREHENSIVE METABOLIC PANEL - Abnormal; Notable for the following:    Glucose, Bld 124 (*)    Total Bilirubin 0.2 (*)    All other components within normal limits  ACETAMINOPHEN LEVEL - Abnormal; Notable for the following:    Acetaminophen (Tylenol), Serum <10 (*)    All other  components within normal limits  URINALYSIS COMPLETEWITH MICROSCOPIC ([facility] ONLY) - Abnormal; Notable for the following:    Color, Urine STRAW (*)    APPearance CLEAR (*)    Squamous Epithelial / LPF 0-5 (*)    All other components within normal limits  ETHANOL  SALICYLATE LEVEL  CBC  URINE DRUG SCREEN, QUALITATIVE ([facility] ONLY)   ____________________________________________  EKG  None ____________________________________________  RADIOLOGY  Right hand complete (viewed by me, interpreted per Dr. Radene Knee): Mildly comminuted  fracture at the distal fifth metacarpal, extending to the edge of the fifth metacarpophalangeal joint. Mild radial and volar angulation. ____________________________________________   PROCEDURES  Procedure(s) performed:   SPLINT APPLICATION Date/Time: 0000000 AM Authorized by: Paulette Blanch Consent: Verbal consent obtained. Risks and benefits: risks, benefits and alternatives were discussed Consent given by: patient Splint applied by: orthopedic technician Location details: right hand Splint type: boxer's Supplies used: ocl Post-procedure: The splinted body part was neurovascularly unchanged following the procedure. Patient tolerance: Patient tolerated the procedure well with no immediate complications.    Critical Care performed: No  ____________________________________________   INITIAL IMPRESSION / ASSESSMENT AND PLAN / ED COURSE  Pertinent labs & imaging results that were available during my care of the patient were reviewed by me and considered in my medical decision making (see chart for details).  50 year old female who presents for depression and suicidal thoughts. She is hopeless and tearful. Attempting to leave the premises when told she would need to change out of her jeans into behavioral scrub pants. I will place her under IVC for her safety. Will consult TTS and psychiatry to evaluate patient in the emergency  department.  ----------------------------------------- 5:25 AM on 05/16/2015 -----------------------------------------  Reportedly after patient got to [facility], she punched a wall in anger at having to remove her hair band. X-rays of her right hand reveals mildly comminuted fracture of her distal fifth metacarpal. Will place in boxer splint and bring patient back to the acute ED pending psychiatry disposition. ____________________________________________   FINAL CLINICAL IMPRESSION(S) / ED DIAGNOSES  Final diagnoses:  Depression  Boxer's fracture, closed, initial encounter      Paulette Blanch, MD 05/16/15 [phone]

## 2015-05-16 NOTE — Progress Notes (Signed)
Per request of  Dr. Weber Cooks, writer provided the pt. with information and instructions on how to access [facility] ([facility]). Pt also advised to follow up with her PCP.    05/16/2015 Con Memos, MS, [place], LPCA Therapeutic Triage Specialist

## 2015-05-16 NOTE — ED Notes (Signed)
Pt refused breakfast tray but [place] tray given . Sitting on side of bed

## 2015-05-16 NOTE — ED Provider Notes (Signed)
Case discussed with psychiatry Dr. Weber Cooks after his evaluation. Feels that the patient is not a danger to herself or others. Although she does have depression he thinks that her current symptoms are precipitated by Wellbutrin use recently. She's been advised to stop the Wellbutrin I will switch to citalopram and follow closely with [facility] and her primary care doctor, Dr. Janae Bridgeman. Medically and psychiatrically stable. IVC rescinded by Dr. Weber Cooks. Discharge home  Carrie Mew, MD 05/16/15 1245

## 2015-05-16 NOTE — ED Notes (Addendum)
Patient ambulatory to triage with steady gait, without difficulty, tearful; pt reports "I'm ready to just give up, better if I'm not here anymore"; denies hx of depression; Lattie Haw, EDT in triage for protocols and to change pt into behav scrubs

## 2015-05-16 NOTE — ED Notes (Signed)
Dr. Clapacs in with pt. 

## 2015-05-16 NOTE — BH Assessment (Signed)
Assessment Note  Brandi Acevedo is an 50 y.o. female presenting to the ED with concerns with depression and suicidal ideations without intent/plan.  Pt reports she feels like "people don't want her around" and states that she "doesn't want to be around anymore".  She states that she is not actively suicidal.  She reports ongoing conflict with her daughter.  She denies any drug/alcohol use.  Diagnosis: Depression  Past Medical History:  Past Medical History  Diagnosis Date  . COPD (chronic obstructive pulmonary disease) ([place])   . Hearing loss   . RLS (restless legs syndrome)   . IBS (irritable bowel syndrome)   . Heart attack ([place])   . Sleep apnea     Past Surgical History  Procedure Laterality Date  . Abdominal hysterectomy    . Tympanostomy tube placement      X 2  . Tonsillectomy    . Dilation and curettage of uterus    . Achilles tendon repair    . Cholecystectomy    . Cesarean section      X 3  . Hernia repair      Family History:  Family History  Problem Relation Age of Onset  . Diabetes Mother   . Restless legs syndrome Mother   . Sleep apnea Mother   . Hypertension Mother   . Hyperlipidemia Mother   . Diabetes Father   . Hypertension Father   . Hyperlipidemia Father   . Heart disease Father   . Heart attack Father   . Diabetes Sister   . Hyperlipidemia Sister   . Hypertension Sister   . Heart disease Sister   . Heart attack Sister   . Hyperlipidemia Daughter   . Mental illness Daughter     Bipolar Disorder  . Heart disease Maternal Grandmother   . Stroke Maternal Grandfather   . Heart attack Maternal Grandfather   . Cancer Paternal Grandmother   . Hyperlipidemia Daughter     Social History:  reports that she has been smoking Cigarettes.  She has been smoking about 0.25 packs per day. She has never used smokeless tobacco. She reports that she does not drink alcohol or use illicit drugs.  Additional Social History:  Alcohol / Drug Use History of  alcohol / drug use?: No history of alcohol / drug abuse (Pt denies)  CIWA: CIWA-Ar BP: (!) 154/115 mmHg Pulse Rate: 88 COWS:    Allergies:  Allergies  Allergen Reactions  . Codeine     Gi upset  . Flagyl [Metronidazole] Rash  . Keflex [Cephalexin] Rash  . Septra [Sulfamethoxazole-Trimethoprim] Rash    Home Medications:  (Not in a hospital admission)  OB/GYN Status:  No LMP recorded. Patient has had a hysterectomy.  General Assessment Data Location of Assessment: [facility] ED TTS Assessment: In system Is this a Tele or Face-to-Face Assessment?: Face-to-Face Is this an Initial Assessment or a Re-assessment for this encounter?: Initial Assessment Marital status: Single Maiden name: Odaniel Is patient pregnant?: No Pregnancy Status: No Living Arrangements: Alone Can pt return to current living arrangement?: Yes Admission Status: Voluntary Is patient capable of signing voluntary admission?: Yes Referral Source: Self/Family/Friend Insurance type: [place] Living Arrangements: Alone Legal Guardian: Other: (self) Name of Psychiatrist: None reported Name of Therapist: None reported  Education Status Is patient currently in school?: No Current Grade: N/A Highest grade of school patient has completed: 12th Name of school: N/A Contact person: N/A  Risk to self with the  past 6 months Suicidal Ideation: No Has patient been a risk to self within the past 6 months prior to admission? : No Suicidal Intent: No Has patient had any suicidal intent within the past 6 months prior to admission? : No Is patient at risk for suicide?: No Suicidal Plan?: No Has patient had any suicidal plan within the past 6 months prior to admission? : No Access to Means: No What has been your use of drugs/alcohol within the last 12 months?: None identified Previous Attempts/Gestures: No How many times?: 0 Other Self Harm Risks: None identified Triggers for Past Attempts: None  known Intentional Self Injurious Behavior: None Family Suicide History: No Recent stressful life event(s): Conflict (Comment) (Conflict with daughter) Persecutory voices/beliefs?: No Depression: Yes Depression Symptoms: Tearfulness, Loss of interest in usual pleasures, Feeling worthless/self pity Substance abuse history and/or treatment for substance abuse?: No Suicide prevention information given to non-admitted patients: Not applicable  Risk to Others within the past 6 months Homicidal Ideation: No Does patient have any lifetime risk of violence toward others beyond the six months prior to admission? : No Thoughts of Harm to Others: No Current Homicidal Intent: No Current Homicidal Plan: No Access to Homicidal Means: No Identified Victim: None identified History of harm to others?: No Assessment of Violence: None Noted Violent Behavior Description: None identified Does patient have access to weapons?: No Criminal Charges Pending?: No Does patient have a court date: No Is patient on probation?: No  Psychosis Hallucinations: None noted Delusions: None noted  Mental Status Report Appearance/Hygiene: In scrubs Eye Contact: Fair Motor Activity: Freedom of movement Speech: Logical/coherent Level of Consciousness: Crying, Alert Mood: Depressed, Sad Affect: Sad, Depressed Anxiety Level: Minimal Thought Processes: Coherent, Relevant Judgement: Unimpaired Orientation: Person, Place, Time, Situation, Appropriate for developmental age Obsessive Compulsive Thoughts/Behaviors: None  Cognitive Functioning Concentration: Normal Memory: Recent Intact, Remote Intact IQ: Average Insight: Good Impulse Control: Good Appetite: Fair Weight Loss: 0 Weight Gain: 0 Sleep: No Change Total Hours of Sleep: 8 Vegetative Symptoms: None  ADLScreening [facility] Assessment Services) Patient's cognitive ability adequate to safely complete daily activities?: Yes Patient able to express need for  assistance with ADLs?: Yes Independently performs ADLs?: Yes (appropriate for developmental age)  Prior Inpatient Therapy Prior Inpatient Therapy: No Prior Therapy Dates: N/A Prior Therapy Facilty/Provider(s): N/A Reason for Treatment: N/A  Prior Outpatient Therapy Prior Outpatient Therapy: No Prior Therapy Dates: N/A Prior Therapy Facilty/Provider(s): N/A Reason for Treatment: N/A Does patient have an ACCT team?: No Does patient have Intensive In-House Services?  : No Does patient have [facility] services? : No Does patient have P4CC services?: No  ADL Screening (condition at time of admission) Patient's cognitive ability adequate to safely complete daily activities?: Yes Patient able to express need for assistance with ADLs?: Yes Independently performs ADLs?: Yes (appropriate for developmental age)       Abuse/Neglect Assessment (Assessment to be complete while patient is alone) Physical Abuse: Denies Verbal Abuse: Denies Sexual Abuse: Denies Exploitation of patient/patient's resources: Denies Self-Neglect: Denies Values / Beliefs Cultural Requests During Hospitalization: None Spiritual Requests During Hospitalization: None Consults Spiritual Care Consult Needed: No Social Work Consult Needed: No Regulatory affairs officer (For Healthcare) Does patient have an advance directive?: No Would patient like information on creating an advanced directive?: Yes Higher education careers adviser given    Additional Information 1:1 In Past 12 Months?: No CIRT Risk: No Elopement Risk: No Does patient have medical clearance?: Yes     Disposition:  Disposition Initial Assessment Completed for  this Encounter: Yes Disposition of Patient: Other dispositions Other disposition(s): Other (Comment) (Pending Psych Consult)  On Site Evaluation by:   Reviewed with Physician:    Muhsin Doris C Dakayla Disanti 05/16/2015 2:09 AM

## 2015-05-16 NOTE — ED Notes (Addendum)
Pt presents to ED with c/o depression, states "I just feel like theres people that don't want me here." Pt denies hx of depression. Pt tearful during assessment. Pt alert and oriented x 4, no increased work in breathing noted, skin warm and dry. MD at bedside.

## 2015-05-16 NOTE — Discharge Instructions (Signed)
Cast or Splint Care Casts and splints support injured limbs and keep bones from moving while they heal. It is important to care for your cast or splint at home.  HOME CARE INSTRUCTIONS  Keep the cast or splint uncovered during the drying period. It can take 24 to 48 hours to dry if it is made of plaster. A fiberglass cast will dry in less than 1 hour.  Do not rest the cast on anything harder than a pillow for the first 24 hours.  Do not put weight on your injured limb or apply pressure to the cast until your health care provider gives you permission.  Keep the cast or splint dry. Wet casts or splints can lose their shape and may not support the limb as well. A wet cast that has lost its shape can also create harmful pressure on your skin when it dries. Also, wet skin can become infected.  Cover the cast or splint with a plastic bag when bathing or when out in the rain or snow. If the cast is on the trunk of the body, take sponge baths until the cast is removed.  If your cast does become wet, dry it with a towel or a blow dryer on the cool setting only.  Keep your cast or splint clean. Soiled casts may be wiped with a moistened cloth.  Do not place any hard or soft foreign objects under your cast or splint, such as cotton, toilet paper, lotion, or powder.  Do not try to scratch the skin under the cast with any object. The object could get stuck inside the cast. Also, scratching could lead to an infection. If itching is a problem, use a blow dryer on a cool setting to relieve discomfort.  Do not trim or cut your cast or remove padding from inside of it.  Exercise all joints next to the injury that are not immobilized by the cast or splint. For example, if you have a long leg cast, exercise the hip joint and toes. If you have an arm cast or splint, exercise the shoulder, elbow, thumb, and fingers.  Elevate your injured arm or leg on 1 or 2 pillows for the first 1 to 3 days to decrease  swelling and pain.It is best if you can comfortably elevate your cast so it is higher than your heart. SEEK MEDICAL CARE IF:   Your cast or splint cracks.  Your cast or splint is too tight or too loose.  You have unbearable itching inside the cast.  Your cast becomes wet or develops a soft spot or area.  You have a bad smell coming from inside your cast.  You get an object stuck under your cast.  Your skin around the cast becomes red or raw.  You have new pain or worsening pain after the cast has been applied. SEEK IMMEDIATE MEDICAL CARE IF:   You have fluid leaking through the cast.  You are unable to move your fingers or toes.  You have discolored (blue or white), cool, painful, or very swollen fingers or toes beyond the cast.  You have tingling or numbness around the injured area.  You have severe pain or pressure under the cast.  You have any difficulty with your breathing or have shortness of breath.  You have chest pain.   This information is not intended to replace advice given to you by your health care provider. Make sure you discuss any questions you have with your health care  provider.   Document Released: 02/28/2000 Document Revised: 12/21/2012 Document Reviewed: 09/08/2012 Elsevier Interactive Patient Education 2016 Elsevier Inc.   Major Depressive Disorder Major depressive disorder is a mental illness. It also may be called clinical depression or unipolar depression. Major depressive disorder usually causes feelings of sadness, hopelessness, or helplessness. Some people with this disorder do not feel particularly sad but lose interest in doing things they used to enjoy (anhedonia). Major depressive disorder also can cause physical symptoms. It can interfere with work, school, relationships, and other normal everyday activities. The disorder varies in severity but is longer lasting and more serious than the sadness we all feel from time to time in our  lives. Major depressive disorder often is triggered by stressful life events or major life changes. Examples of these triggers include divorce, loss of your job or home, a move, and the death of a family member or close friend. Sometimes this disorder occurs for no obvious reason at all. People who have family members with major depressive disorder or bipolar disorder are at higher risk for developing this disorder, with or without life stressors. Major depressive disorder can occur at any age. It may occur just once in your life (single episode major depressive disorder). It may occur multiple times (recurrent major depressive disorder). SYMPTOMS People with major depressive disorder have either anhedonia or depressed mood on nearly a daily basis for at least 2 weeks or longer. Symptoms of depressed mood include:  Feelings of sadness (blue or down in the dumps) or emptiness.  Feelings of hopelessness or helplessness.  Tearfulness or episodes of crying (may be observed by others).  Irritability (children and adolescents). In addition to depressed mood or anhedonia or both, people with this disorder have at least four of the following symptoms:  Difficulty sleeping or sleeping too much.   Significant change (increase or decrease) in appetite or weight.   Lack of energy or motivation.  Feelings of guilt and worthlessness.   Difficulty concentrating, remembering, or making decisions.  Unusually slow movement (psychomotor retardation) or restlessness (as observed by others).   Recurrent wishes for death, recurrent thoughts of self-harm (suicide), or a suicide attempt. People with major depressive disorder commonly have persistent negative thoughts about themselves, other people, and the world. People with severe major depressive disorder may experiencedistorted beliefs or perceptions about the world (psychotic delusions). They also may see or hear things that are not real (psychotic  hallucinations). DIAGNOSIS Major depressive disorder is diagnosed through an assessment by your health care provider. Your health care provider will ask aboutaspects of your daily life, such as mood,sleep, and appetite, to see if you have the diagnostic symptoms of major depressive disorder. Your health care provider may ask about your medical history and use of alcohol or drugs, including prescription medicines. Your health care provider also may do a physical exam and blood work. This is because certain medical conditions and the use of certain substances can cause major depressive disorder-like symptoms (secondary depression). Your health care provider also may refer you to a mental health specialist for further evaluation and treatment. TREATMENT It is important to recognize the symptoms of major depressive disorder and seek treatment. The following treatments can be prescribed for this disorder:   Medicine. Antidepressant medicines usually are prescribed. Antidepressant medicines are thought to correct chemical imbalances in the brain that are commonly associated with major depressive disorder. Other types of medicine may be added if the symptoms do not respond to antidepressant medicines alone or  if psychotic delusions or hallucinations occur.  Talk therapy. Talk therapy can be helpful in treating major depressive disorder by providing support, education, and guidance. Certain types of talk therapy also can help with negative thinking (cognitive behavioral therapy) and with relationship issues that trigger this disorder (interpersonal therapy). A mental health specialist can help determine which treatment is best for you. Most people with major depressive disorder do well with a combination of medicine and talk therapy. Treatments involving electrical stimulation of the brain can be used in situations with extremely severe symptoms or when medicine and talk therapy do not work over time. These  treatments include electroconvulsive therapy, transcranial magnetic stimulation, and vagal nerve stimulation.   This information is not intended to replace advice given to you by your health care provider. Make sure you discuss any questions you have with your health care provider.   Document Released: 06/27/2012 Document Revised: 03/23/2014 Document Reviewed: 06/27/2012 Elsevier Interactive Patient Education 2016 [employer].  Suicidal Feelings: How to Help Yourself Suicide is the taking of one's own life. If you feel as though life is getting too tough to handle and are thinking about suicide, get help right away. To get help:  Call your local emergency services (911 in the [place]  Call a suicide hotline to speak with a trained counselor who understands how you are feeling. The following is a list of suicide hotlines in the [place]. For a list of hotlines in [place], visit FindSkins.pl.  1-800-273-TALK (909) 373-4683).  1-800-SUICIDE 431-778-0377).  816-332-5730. This is a hotline for Spanish speakers.  U1166179 914-747-0984). This is a hotline for TTY users.  1-866-4-U-TREVOR 775-239-8243). This is a hotline for lesbian, gay, bisexual, transgender, or questioning youth.  Contact a crisis center or a local suicide prevention center. To find a crisis center or suicide prevention center:  Call your local hospital, clinic, community service organization, mental health center, social service provider, or health department. Ask for assistance in connecting to a crisis center.  Visit BankingRep.com.au for a list of crisis centers in the [place], or visit www.suicideprevention.ca/thinking-about-suicide/find-a-crisis-centre for a list of centers in [place].  Visit the following websites:  National Suicide Prevention Lifeline:  www.suicidepreventionlifeline.org  Hopeline: www.hopeline.[place] for Suicide Prevention: PromotionalLoans.co.za  The [employer] (for lesbian, gay, bisexual, transgender, or questioning youth): www.thetrevorproject.org HOW CAN I HELP MYSELF FEEL BETTER?  Promise yourself that you will not do anything drastic when you have suicidal feelings. Remember, there is hope. Many people have gotten through suicidal thoughts and feelings, and you will, too. You may have gotten through them before, and this proves that you can get through them again.  Let family, friends, teachers, or counselors know how you are feeling. Try not to isolate yourself from those who care about you. Remember, they will want to help you. Talk with someone every day, even if you do not feel sociable. Face-to-face conversation is best.  Call a mental health professional and see one regularly.  Visit your primary health care provider every year.  Eat a well-balanced diet, and space your meals so you eat regularly.  Get plenty of rest.  Avoid alcohol and drugs, and remove them from your home. They will only make you feel worse.  If you are thinking of taking a lot of medicine, give your medicine to someone who can give it to you one day at a time. If you are on antidepressants and are concerned you will overdose, let your health care provider know  so he or she can give you safer medicines. Ask your mental health professional about the possible side effects of any medicines you are taking.  Remove weapons, poisons, knives, and anything else that could harm you from your home.  Try to stick to routines. Follow a schedule every day. Put self-care on your schedule.  Make a list of realistic goals, and cross them off when you achieve them. Accomplishments give a sense of worth.  Wait until you are feeling better before doing the things you find difficult or unpleasant.  Exercise if you are able. You will feel  better if you exercise for even a half hour each day.  Go out in the sun or into nature. This will help you recover from depression faster. If you have a favorite place to walk, go there.  Do the things that have always given you pleasure. Play your favorite music, read a good book, paint a picture, play your favorite instrument, or do anything else that takes your mind off your depression if it is safe to do.  Keep your living space well lit.  When you are feeling well, write yourself a letter about tips and support that you can read when you are not feeling well.  Remember that life's difficulties can be sorted out with help. Conditions can be treated. You can work on thoughts and strategies that serve you well.   This information is not intended to replace advice given to you by your health care provider. Make sure you discuss any questions you have with your health care provider.   Document Released: 09/06/2002 Document Revised: 03/23/2014 Document Reviewed: 06/27/2013 Elsevier Interactive Patient Education [employer].

## 2015-05-21 ENCOUNTER — Encounter: Payer: Self-pay | Admitting: Family Medicine

## 2015-05-21 ENCOUNTER — Ambulatory Visit (INDEPENDENT_AMBULATORY_CARE_PROVIDER_SITE_OTHER): Payer: BLUE CROSS/BLUE SHIELD | Admitting: Family Medicine

## 2015-05-21 VITALS — BP 133/82 | HR 83 | Temp 98.9°F | Ht 60.2 in | Wt 174.0 lb

## 2015-05-21 DIAGNOSIS — F322 Major depressive disorder, single episode, severe without psychotic features: Secondary | ICD-10-CM

## 2015-05-21 DIAGNOSIS — S62306A Unspecified fracture of fifth metacarpal bone, right hand, initial encounter for closed fracture: Secondary | ICD-10-CM | POA: Diagnosis not present

## 2015-05-21 NOTE — Progress Notes (Signed)
BP 133/82 mmHg  Pulse 83  Temp(Src) 98.9 F (37.2 C)  Ht 5' 0.2" (1.529 m)  Wt 174 lb (78.926 kg)  BMI 33.76 kg/m2  SpO2 98%   Subjective:    Patient ID: Brandi Acevedo, female    DOB: [birth year], 50 y.o.   MRN: [id number]  HPI: Brandi Acevedo is a 50 y.o. female  Chief Complaint  Patient presents with  . Hospitalization Follow-up    Patient had herself commited for depression, she stayed overnight   ER FOLLOW UP Time since discharge: 4 days Hospital/facility: [facility] Diagnosis: depression precipitated by wellbutrin Procedures/tests: None Consultants: Psychiatry New medications: Celexa Discharge instructions:  Follow up here and with [facility], follow up with Dr. Sabra Heck for fractured hand Status: fluctuating  DEPRESSION Mood status: exacerbated Satisfied with current treatment?: no Symptom severity: severe  Duration of current treatment : 4 days Side effects: no Medication compliance: excellent compliance Psychotherapy/counseling: no  Previous psychiatric medications: wellbutrin Depressed mood: yes Anxious mood: yes Anhedonia: yes Significant weight loss or gain: no Insomnia: yes hard to stay asleep Fatigue: yes Feelings of worthlessness or guilt: yes Impaired concentration/indecisiveness: yes Suicidal ideations: yes- passive, previously, not now Hopelessness: yes Crying spells: yes Depression screen PHQ 2/9 05/21/2015  Decreased Interest 2  Down, Depressed, Hopeless 3  PHQ - 2 Score 5  Altered sleeping 2  Tired, decreased energy 3  Change in appetite 2  Feeling bad or failure about yourself  3  Trouble concentrating 1  Moving slowly or fidgety/restless 2  Suicidal thoughts 3  PHQ-9 Score 21  Difficult doing work/chores Very difficult   HAND PAIN Duration: 4 days Involved hand: right Mechanism of injury: trauma, had a bad reaction to medication in the ER and punched a wall and broke her hand Location: diffuse Onset: sudden Severity: severe  Quality:  aching and sharp Frequency: constant Radiation: no Relief with NSAIDs?: mild Weakness: no Numbness: no Redness: yes Swelling:yes Bruising: yes Fevers: no  Relevant past medical, surgical, family and social history reviewed and updated as indicated. Interim medical history since our last visit reviewed. Allergies and medications reviewed and updated.   Review of Systems  Constitutional: Negative.   Respiratory: Negative.   Cardiovascular: Negative.   Musculoskeletal: Positive for joint swelling and arthralgias. Negative for myalgias, back pain, gait problem, neck pain and neck stiffness.  Psychiatric/Behavioral: Positive for suicidal ideas, sleep disturbance, self-injury and dysphoric mood. Negative for hallucinations, behavioral problems, confusion, decreased concentration and agitation. The patient is nervous/anxious. The patient is not hyperactive.     Per HPI unless specifically indicated above     Objective:    BP 133/82 mmHg  Pulse 83  Temp(Src) 98.9 F (37.2 C)  Ht 5' 0.2" (1.529 m)  Wt 174 lb (78.926 kg)  BMI 33.76 kg/m2  SpO2 98%  Wt Readings from Last 3 Encounters:  05/21/15 174 lb (78.926 kg)  05/16/15 169 lb (76.658 kg)  05/03/15 176 lb (79.833 kg)    Physical Exam  Constitutional: She is oriented to person, place, and time. She appears well-developed and well-nourished. No distress.  HENT:  Head: Normocephalic and atraumatic.  Right Ear: Hearing normal.  Left Ear: Hearing normal.  Nose: Nose normal.  Eyes: Conjunctivae and lids are normal. Right eye exhibits no discharge. Left eye exhibits no discharge. No scleral icterus.  Cardiovascular: Normal rate, regular rhythm, normal heart sounds and intact distal pulses.  Exam reveals no gallop and no friction rub.   No murmur heard. Pulmonary/Chest: Effort normal  and breath sounds normal. No respiratory distress. She has no wheezes. She has no rales. She exhibits no tenderness.  Musculoskeletal:  R hand  wrapped in ACE bandage, tender to palpation, significant bruising.   Neurological: She is alert and oriented to person, place, and time.  Skin: Skin is warm, dry and intact. No rash noted. No erythema. No pallor.  Psychiatric: Her speech is normal and behavior is normal. Judgment and thought content normal. Cognition and memory are normal. She exhibits a depressed mood.  Nursing note and vitals reviewed.   Results for orders placed or performed during the hospital encounter of 05/16/15  Comprehensive metabolic panel  Result Value Ref Range   Sodium 139 135 - 145 mmol/L   Potassium 3.6 3.5 - 5.1 mmol/L   Chloride 105 101 - 111 mmol/L   CO2 28 22 - 32 mmol/L   Glucose, Bld 124 (H) 65 - 99 mg/dL   BUN 13 6 - 20 mg/dL   Creatinine, Ser 0.79 0.44 - 1.00 mg/dL   Calcium 8.9 8.9 - 10.3 mg/dL   Total Protein 7.3 6.5 - 8.1 g/dL   Albumin 3.9 3.5 - 5.0 g/dL   AST 18 15 - 41 U/L   ALT 25 14 - 54 U/L   Alkaline Phosphatase 65 38 - 126 U/L   Total Bilirubin 0.2 (L) 0.3 - 1.2 mg/dL   GFR calc non Af Amer >60 >60 mL/min   GFR calc Af Amer >60 >60 mL/min   Anion gap 6 5 - 15  Ethanol (ETOH)  Result Value Ref Range   Alcohol, Ethyl (B) <5 <5 mg/dL  Salicylate level  Result Value Ref Range   Salicylate Lvl 123456 2.8 - 30.0 mg/dL  Acetaminophen level  Result Value Ref Range   Acetaminophen (Tylenol), Serum <10 (L) 10 - 30 ug/mL  CBC  Result Value Ref Range   WBC 10.0 3.6 - 11.0 K/uL   RBC 4.42 3.80 - 5.20 MIL/uL   Hemoglobin 13.3 12.0 - 16.0 g/dL   HCT 39.4 35.0 - 47.0 %   MCV 89.2 80.0 - 100.0 fL   MCH 30.1 26.0 - 34.0 pg   MCHC 33.7 32.0 - 36.0 g/dL   RDW 14.1 11.5 - 14.5 %   Platelets 309 150 - 440 K/uL  Urine Drug Screen, Qualitative ([facility] only)  Result Value Ref Range   Tricyclic, Ur Screen NONE DETECTED NONE DETECTED   Amphetamines, Ur Screen NONE DETECTED NONE DETECTED   MDMA (Ecstasy)Ur Screen NONE DETECTED NONE DETECTED   Cocaine Metabolite,Ur [place] NONE DETECTED NONE DETECTED    Opiate, Ur Screen NONE DETECTED NONE DETECTED   Phencyclidine (PCP) Ur S NONE DETECTED NONE DETECTED   Cannabinoid 50 Ng, Ur [place] NONE DETECTED NONE DETECTED   Barbiturates, Ur Screen NONE DETECTED NONE DETECTED   Benzodiazepine, Ur Scrn NONE DETECTED NONE DETECTED   Methadone Scn, Ur NONE DETECTED NONE DETECTED  Urinalysis complete, with microscopic ([facility] only)  Result Value Ref Range   Color, Urine STRAW (A) YELLOW   APPearance CLEAR (A) CLEAR   Glucose, UA NEGATIVE NEGATIVE mg/dL   Bilirubin Urine NEGATIVE NEGATIVE   Ketones, ur NEGATIVE NEGATIVE mg/dL   Specific Gravity, Urine 1.008 1.005 - 1.030   Hgb urine dipstick NEGATIVE NEGATIVE   pH 6.0 5.0 - 8.0   Protein, ur NEGATIVE NEGATIVE mg/dL   Nitrite NEGATIVE NEGATIVE   Leukocytes, UA NEGATIVE NEGATIVE   RBC / HPF 0-5 0 - 5 RBC/hpf   WBC, UA 0-5  0 - 5 WBC/hpf   Bacteria, UA NONE SEEN NONE SEEN   Squamous Epithelial / LPF 0-5 (A) NONE SEEN   Mucous PRESENT       Assessment & Plan:   Problem List Items Addressed This Visit      Musculoskeletal and Integument   Fracture of fifth metacarpal bone of right hand    To see Dr. Sabra Heck on Thursday. Keep hand immobilized. Don't use it. Continue to wear splint as recommended. Continue naproxen as needed.         Other   Depression, major, single episode, severe ([place]) - Primary    Will continue on celexa. Discussed risk of increased suicidality in the first couple of weeks. She is aware and will check in with her daughter about it as well. Will recheck in 2 weeks to see how she's doing. Will call [facility] over the next 2 weeks before her next appointment. Continue celexa. Check back in in 2 weeks.           Follow up plan: Return in about 2 weeks (around 06/04/2015) for Follow up on Mood.

## 2015-05-21 NOTE — Assessment & Plan Note (Signed)
To see Dr. Sabra Heck on Thursday. Keep hand immobilized. Don't use it. Continue to wear splint as recommended. Continue naproxen as needed.

## 2015-05-21 NOTE — Assessment & Plan Note (Signed)
Will continue on celexa. Discussed risk of increased suicidality in the first couple of weeks. She is aware and will check in with her daughter about it as well. Will recheck in 2 weeks to see how she's doing. Will call [facility] over the next 2 weeks before her next appointment. Continue celexa. Check back in in 2 weeks.

## 2015-06-04 ENCOUNTER — Encounter: Payer: Self-pay | Admitting: Family Medicine

## 2015-06-04 ENCOUNTER — Ambulatory Visit (INDEPENDENT_AMBULATORY_CARE_PROVIDER_SITE_OTHER): Payer: BLUE CROSS/BLUE SHIELD | Admitting: Family Medicine

## 2015-06-04 VITALS — BP 136/85 | HR 78 | Temp 98.3°F | Ht 60.2 in | Wt 172.0 lb

## 2015-06-04 DIAGNOSIS — F322 Major depressive disorder, single episode, severe without psychotic features: Secondary | ICD-10-CM

## 2015-06-04 MED ORDER — CITALOPRAM HYDROBROMIDE 20 MG PO TABS: 20.0000 mg | ORAL_TABLET | Freq: Every day | ORAL | Status: DC

## 2015-06-04 MED ORDER — ROPINIROLE HCL 0.25 MG PO TABS: 0.2500 mg | ORAL_TABLET | Freq: Every day | ORAL | Status: DC

## 2015-06-04 NOTE — Assessment & Plan Note (Signed)
Stable, and improved by PHQ9. Continue to follow with [facility]. Continue to stay with daughter for now. Recheck 2 weeks to see how she's doing. Call us or go to ER with suicidal thoughts.

## 2015-06-04 NOTE — Progress Notes (Signed)
BP 136/85 mmHg  Pulse 78  Temp(Src) 98.3 F (36.8 C)  Ht 5' 0.2" (1.529 m)  Wt 172 lb (78.019 kg)  BMI 33.37 kg/m2  SpO2 97%   Subjective:    Patient ID: Brandi Acevedo, female    DOB: 05/09/1965, 50 y.o.   MRN: [id number]  HPI: Brandi Acevedo is a 50 y.o. female  Chief Complaint  Patient presents with  . Depression   DEPRESSION- went to [place] yesterday, going back on 4/6 and 4/11 to talk to the doctor, Staying with her daughter now, because she was having some thoughts of hurting herself. They are getting better. Her daughter is watching her closely and she will go to the ER if she is feeling out of control. Mood status: stable Satisfied with current treatment?: no Symptom severity: moderate  Duration of current treatment : 2 weeks Side effects: yes, sleepiness Medication compliance: excellent compliance Psychotherapy/counseling: yes current Depressed mood: yes Anxious mood: no Anhedonia: no Significant weight loss or gain: no Insomnia: yes  Fatigue: yes Feelings of worthlessness or guilt: yes Impaired concentration/indecisiveness: yes Suicidal ideations: yes Hopelessness: yes Crying spells: yes Depression screen [facility] 2/9 06/04/2015 05/21/2015  Decreased Interest 2 2  Down, Depressed, Hopeless 1 3  PHQ - 2 Score 3 5  Altered sleeping 2 2  Tired, decreased energy 1 3  Change in appetite 2 2  Feeling bad or failure about yourself  2 3  Trouble concentrating 1 1  Moving slowly or fidgety/restless 1 2  Suicidal thoughts 1 3  PHQ-9 Score 13 21  Difficult doing work/chores Very difficult Very difficult   Relevant past medical, surgical, family and social history reviewed and updated as indicated. Interim medical history since our last visit reviewed. Allergies and medications reviewed and updated.  Review of Systems  Constitutional: Negative.   Respiratory: Negative.   Cardiovascular: Negative.   Psychiatric/Behavioral: Positive for sleep disturbance, dysphoric  mood and decreased concentration. Negative for suicidal ideas, hallucinations, behavioral problems, confusion, self-injury and agitation. The patient is not nervous/anxious and is not hyperactive.     Per HPI unless specifically indicated above     Objective:    BP 136/85 mmHg  Pulse 78  Temp(Src) 98.3 F (36.8 C)  Ht 5' 0.2" (1.529 m)  Wt 172 lb (78.019 kg)  BMI 33.37 kg/m2  SpO2 97%  Wt Readings from Last 3 Encounters:  06/04/15 172 lb (78.019 kg)  05/21/15 174 lb (78.926 kg)  05/16/15 169 lb (76.658 kg)    Physical Exam  Constitutional: She is oriented to person, place, and time. She appears well-developed and well-nourished. No distress.  HENT:  Head: Normocephalic and atraumatic.  Right Ear: Hearing normal.  Left Ear: Hearing normal.  Nose: Nose normal.  Eyes: Conjunctivae and lids are normal. Right eye exhibits no discharge. Left eye exhibits no discharge. No scleral icterus.  Cardiovascular: Normal rate, regular rhythm, normal heart sounds and intact distal pulses.  Exam reveals no gallop and no friction rub.   No murmur heard. Pulmonary/Chest: Effort normal and breath sounds normal. No respiratory distress. She has no wheezes. She has no rales. She exhibits no tenderness.  Musculoskeletal: Normal range of motion.  Neurological: She is alert and oriented to person, place, and time.  Skin: Skin is warm, dry and intact. No rash noted. No erythema. No pallor.  Psychiatric: She has a normal mood and affect. Her speech is normal and behavior is normal. Judgment and thought content normal. Cognition and memory are normal.  Nursing note and vitals reviewed.   Results for orders placed or performed during the hospital encounter of 05/16/15  Comprehensive metabolic panel  Result Value Ref Range   Sodium 139 135 - 145 mmol/L   Potassium 3.6 3.5 - 5.1 mmol/L   Chloride 105 101 - 111 mmol/L   CO2 28 22 - 32 mmol/L   Glucose, Bld 124 (H) 65 - 99 mg/dL   BUN 13 6 - 20 mg/dL    Creatinine, Ser 0.79 0.44 - 1.00 mg/dL   Calcium 8.9 8.9 - 10.3 mg/dL   Total Protein 7.3 6.5 - 8.1 g/dL   Albumin 3.9 3.5 - 5.0 g/dL   AST 18 15 - 41 U/L   ALT 25 14 - 54 U/L   Alkaline Phosphatase 65 38 - 126 U/L   Total Bilirubin 0.2 (L) 0.3 - 1.2 mg/dL   GFR calc non Af Amer >60 >60 mL/min   GFR calc Af Amer >60 >60 mL/min   Anion gap 6 5 - 15  Ethanol (ETOH)  Result Value Ref Range   Alcohol, Ethyl (B) <5 <5 mg/dL  Salicylate level  Result Value Ref Range   Salicylate Lvl 123456 2.8 - 30.0 mg/dL  Acetaminophen level  Result Value Ref Range   Acetaminophen (Tylenol), Serum <10 (L) 10 - 30 ug/mL  CBC  Result Value Ref Range   WBC 10.0 3.6 - 11.0 K/uL   RBC 4.42 3.80 - 5.20 MIL/uL   Hemoglobin 13.3 12.0 - 16.0 g/dL   HCT 39.4 35.0 - 47.0 %   MCV 89.2 80.0 - 100.0 fL   MCH 30.1 26.0 - 34.0 pg   MCHC 33.7 32.0 - 36.0 g/dL   RDW 14.1 11.5 - 14.5 %   Platelets 309 150 - 440 K/uL  Urine Drug Screen, Qualitative ([facility] only)  Result Value Ref Range   Tricyclic, Ur Screen NONE DETECTED NONE DETECTED   Amphetamines, Ur Screen NONE DETECTED NONE DETECTED   MDMA (Ecstasy)Ur Screen NONE DETECTED NONE DETECTED   Cocaine Metabolite,Ur [place] NONE DETECTED NONE DETECTED   Opiate, Ur Screen NONE DETECTED NONE DETECTED   Phencyclidine (PCP) Ur S NONE DETECTED NONE DETECTED   Cannabinoid 50 Ng, Ur [place] NONE DETECTED NONE DETECTED   Barbiturates, Ur Screen NONE DETECTED NONE DETECTED   Benzodiazepine, Ur Scrn NONE DETECTED NONE DETECTED   Methadone Scn, Ur NONE DETECTED NONE DETECTED  Urinalysis complete, with microscopic ([facility] only)  Result Value Ref Range   Color, Urine STRAW (A) YELLOW   APPearance CLEAR (A) CLEAR   Glucose, UA NEGATIVE NEGATIVE mg/dL   Bilirubin Urine NEGATIVE NEGATIVE   Ketones, ur NEGATIVE NEGATIVE mg/dL   Specific Gravity, Urine 1.008 1.005 - 1.030   Hgb urine dipstick NEGATIVE NEGATIVE   pH 6.0 5.0 - 8.0   Protein, ur NEGATIVE NEGATIVE mg/dL   Nitrite NEGATIVE  NEGATIVE   Leukocytes, UA NEGATIVE NEGATIVE   RBC / HPF 0-5 0 - 5 RBC/hpf   WBC, UA 0-5 0 - 5 WBC/hpf   Bacteria, UA NONE SEEN NONE SEEN   Squamous Epithelial / LPF 0-5 (A) NONE SEEN   Mucous PRESENT       Assessment & Plan:   Problem List Items Addressed This Visit      Other   Depression, major, single episode, severe (HCC) - Primary    Stable, and improved by PHQ9. Continue to follow with [facility]. Continue to stay with daughter for now. Recheck 2 weeks to see how she's doing.  Call us or go to ER with suicidal thoughts.       Relevant Medications   citalopram (CELEXA) 20 MG tablet       Follow up plan: Return As scheduled.

## 2015-06-27 ENCOUNTER — Encounter: Payer: BLUE CROSS/BLUE SHIELD | Admitting: Family Medicine

## 2015-07-14 ENCOUNTER — Emergency Department: Payer: BLUE CROSS/BLUE SHIELD

## 2015-07-14 ENCOUNTER — Emergency Department
Admission: EM | Admit: 2015-07-14 | Discharge: 2015-07-14 | Disposition: A | Discharge status: Home / Self Care | Payer: BLUE CROSS/BLUE SHIELD | Attending: Student | Admitting: Student

## 2015-07-14 DIAGNOSIS — Z79899 Other long term (current) drug therapy: Secondary | ICD-10-CM | POA: Insufficient documentation

## 2015-07-14 DIAGNOSIS — F17210 Nicotine dependence, cigarettes, uncomplicated: Secondary | ICD-10-CM | POA: Diagnosis not present

## 2015-07-14 DIAGNOSIS — J441 Chronic obstructive pulmonary disease with (acute) exacerbation: Secondary | ICD-10-CM | POA: Diagnosis not present

## 2015-07-14 DIAGNOSIS — R0602 Shortness of breath: Secondary | ICD-10-CM

## 2015-07-14 LAB — COMPREHENSIVE METABOLIC PANEL
ALBUMIN: 3.7 g/dL (ref 3.5–5.0)
ALT: 22 U/L (ref 14–54)
AST: 18 U/L (ref 15–41)
Alkaline Phosphatase: 63 U/L (ref 38–126)
Anion gap: 10 (ref 5–15)
BILIRUBIN TOTAL: 0.4 mg/dL (ref 0.3–1.2)
BUN: 9 mg/dL (ref 6–20)
CO2: 24 mmol/L (ref 22–32)
CREATININE: 0.88 mg/dL (ref 0.44–1.00)
Calcium: 8.9 mg/dL (ref 8.9–10.3)
Chloride: 105 mmol/L (ref 101–111)
GFR calc Af Amer: >60 mL/min (ref >60)
GFR calc non Af Amer: >60 mL/min (ref >60)
GLUCOSE: 94 mg/dL (ref 65–99)
POTASSIUM: 3.6 mmol/L (ref 3.5–5.1)
Sodium: 139 mmol/L (ref 135–145)
TOTAL PROTEIN: 7 g/dL (ref 6.5–8.1)

## 2015-07-14 LAB — CBC WITH DIFFERENTIAL/PLATELET
BASOS ABS: 0.2 10*3/uL — AB (ref 0–0.1)
Basophils Relative: 2 %
Eosinophils Absolute: 0.4 10*3/uL (ref 0–0.7)
Eosinophils Relative: 4 %
HEMATOCRIT: 36.5 % (ref 35.0–47.0)
HEMOGLOBIN: 12.2 g/dL (ref 12.0–16.0)
LYMPHS ABS: 3.8 10*3/uL — AB (ref 1.0–3.6)
LYMPHS PCT: 36 %
MCH: 29.7 pg (ref 26.0–34.0)
MCHC: 33.3 g/dL (ref 32.0–36.0)
MCV: 89.2 fL (ref 80.0–100.0)
MONOS PCT: 9 %
Monocytes Absolute: 0.9 10*3/uL (ref 0.2–0.9)
NEUTROS PCT: 49 %
Neutro Abs: 5.2 10*3/uL (ref 1.4–6.5)
Platelets: 337 10*3/uL (ref 150–440)
RBC: 4.1 MIL/uL (ref 3.80–5.20)
RDW: 13.6 % (ref 11.5–14.5)
WBC: 10.5 10*3/uL (ref 3.6–11.0)

## 2015-07-14 LAB — TROPONIN I: Troponin I: <0.03 ng/mL (ref <0.031)

## 2015-07-14 MED ORDER — PREDNISONE 10 MG PO TABS: 50.0000 mg | ORAL_TABLET | Freq: Every day | ORAL | Status: DC

## 2015-07-14 MED ORDER — ALBUTEROL SULFATE (2.5 MG/3ML) 0.083% IN NEBU
5.0000 mg | INHALATION_SOLUTION | Freq: Once | RESPIRATORY_TRACT | Status: AC
  Administered 2015-07-14: 5 mg via RESPIRATORY_TRACT
  Filled 2015-07-14: qty 6

## 2015-07-14 MED ORDER — IPRATROPIUM-ALBUTEROL 0.5-2.5 (3) MG/3ML IN SOLN
3.0000 mL | Freq: Once | RESPIRATORY_TRACT | Status: AC
  Administered 2015-07-14: 3 mL via RESPIRATORY_TRACT
  Filled 2015-07-14: qty 3

## 2015-07-14 MED ORDER — METHYLPREDNISOLONE SODIUM SUCC 125 MG IJ SOLR
125.0000 mg | Freq: Once | INTRAMUSCULAR | Status: AC
  Administered 2015-07-14: 125 mg via INTRAVENOUS
  Filled 2015-07-14: qty 2

## 2015-07-14 MED ORDER — DEXTROSE 5 % IV SOLN
500.0000 mg | Freq: Once | INTRAVENOUS | Status: AC
  Administered 2015-07-14: 500 mg via INTRAVENOUS
  Filled 2015-07-14: qty 500

## 2015-07-14 MED ORDER — IPRATROPIUM-ALBUTEROL 0.5-2.5 (3) MG/3ML IN SOLN
3.0000 mL | Freq: Once | RESPIRATORY_TRACT | Status: AC
Start: 2015-07-14 — End: 2015-07-14
  Administered 2015-07-14: 3 mL via RESPIRATORY_TRACT
  Filled 2015-07-14: qty 3

## 2015-07-14 NOTE — ED Provider Notes (Signed)
After breathing treatments and Solu-Medrol, patient feels much better. She is able to have a conversation with me. She is 100% on ROOM AIR. IT TURNS OUT SHE HAS BEEN ON NEBULIZER ALBUTEROL, COUGH DROP, AND DOXYCYCLINE. SHE HAS NOT BEEN ON PREDNISONE.  I AM ADDing PREDNISONE PRESCRIPTION.  WE DISCUSSED RETURN PRECAUTIONS. SHE IS OKAY FOR OUTPATIENT MANAGEMENT, ADDING PREDNISONE.  She did tolerate walking with no drop in O2 sat, and tolerated it well.  Lisa Roca, MD 07/14/15 2322

## 2015-07-14 NOTE — ED Provider Notes (Signed)
[facility] Emergency Department Provider Note   ____________________________________________  Time seen: Approximately 8:29 PM  I have reviewed the triage vital signs and the nursing notes.   HISTORY  Chief Complaint Shortness of Breath    HPI Brandi Acevedo is a 50 y.o. female with history of COPD, GERD who presents for evaluation of 6 days of shortness of breath with wheezing, only minimally improved with her inhalers, gradual onset, constant since onset, currently severe. She has had cough which is been productive of clear and yellow sputum. She was seen by her primary care doctor earlier this week, given doxycycline and told to return if her symptoms are not improving. They have not improved. She has had chills but denies fevers. She denies chest pain. No nausea, vomiting, diarrhea, abdominal pain or dysuria. She is postmenopausal.   Past Medical History  Diagnosis Date  . COPD (chronic obstructive pulmonary disease) ([place])   . Hearing loss   . RLS (restless legs syndrome)   . IBS (irritable bowel syndrome)   . Heart attack ([place])   . Sleep apnea     Patient Active Problem List   Diagnosis Date Noted  . Fracture of fifth metacarpal bone of right hand 05/21/2015  . Depression, major, single episode, severe ([place]) 05/16/2015  . Suicidal ideation 05/16/2015  . Self-inflicted injury 0000000  . Recurrent knee pain 04/25/2015  . RLS (restless legs syndrome) 02/26/2015  . Tobacco abuse 02/26/2015  . IBS (irritable bowel syndrome) 02/26/2015  . GERD (gastroesophageal reflux disease) 02/26/2015  . Sleep apnea     Past Surgical History  Procedure Laterality Date  . Abdominal hysterectomy    . Tympanostomy tube placement      X 2  . Tonsillectomy    . Dilation and curettage of uterus    . Achilles tendon repair    . Cholecystectomy    . Cesarean section      X 3  . Hernia repair      Current Outpatient Rx  Name  Route  Sig  Dispense   Refill  . citalopram (CELEXA) 20 MG tablet   Oral   Take 1 tablet (20 mg total) by mouth daily.   30 tablet   2   . dicyclomine (BENTYL) 20 MG tablet   Oral   Take 1 tablet (20 mg total) by mouth every 6 (six) hours. Patient not taking: Reported on 05/16/2015   30 tablet   1   . naproxen (NAPROSYN) 500 MG tablet   Oral   Take 1 tablet (500 mg total) by mouth 2 (two) times daily with a meal.   60 tablet   1   . pantoprazole (PROTONIX) 40 MG tablet   Oral   Take 1 tablet (40 mg total) by mouth daily.   30 tablet   3   . rOPINIRole (REQUIP) 0.25 MG tablet   Oral   Take 1 tablet (0.25 mg total) by mouth at bedtime.   30 tablet   3     Allergies Ativan; Codeine; Wellbutrin; Flagyl; Keflex; and Septra  Family History  Problem Relation Age of Onset  . Diabetes Mother   . Restless legs syndrome Mother   . Sleep apnea Mother   . Hypertension Mother   . Hyperlipidemia Mother   . Diabetes Father   . Hypertension Father   . Hyperlipidemia Father   . Heart disease Father   . Heart attack Father   . Diabetes Sister   .  Hyperlipidemia Sister   . Hypertension Sister   . Heart disease Sister   . Heart attack Sister   . Hyperlipidemia Daughter   . Mental illness Daughter     Bipolar Disorder  . Heart disease Maternal Grandmother   . Stroke Maternal Grandfather   . Heart attack Maternal Grandfather   . Cancer Paternal Grandmother   . Hyperlipidemia Daughter     Social History Social History  Substance Use Topics  . Smoking status: Current Every Day Smoker -- 0.25 packs/day    Types: Cigarettes  . Smokeless tobacco: Never Used  . Alcohol Use: No    Review of Systems Constitutional: No fever/chills Eyes: No visual changes. ENT: No sore throat. Cardiovascular: Denies chest pain. Respiratory: +shortness of breath. Gastrointestinal: No abdominal pain.  No nausea, no vomiting.  No diarrhea.  No constipation. Genitourinary: Negative for dysuria. Musculoskeletal:  Negative for back pain. Skin: Negative for rash. Neurological: Negative for headaches, focal weakness or numbness.  10-point ROS otherwise negative.  ____________________________________________   PHYSICAL EXAM:  VITAL SIGNS: ED Triage Vitals  Enc Vitals Group     BP 07/14/15 1925 123/70 mmHg     Pulse Rate 07/14/15 1925 67     Resp 07/14/15 1925 28     Temp 07/14/15 1925 98.3 F (36.8 C)     Temp Source 07/14/15 1925 Oral     SpO2 07/14/15 1925 98 %     Weight 07/14/15 1925 164 lb (74.39 kg)     Height 07/14/15 1925 5\' 1"  (1.549 m)     Head Cir --      Peak Flow --      Pain Score --      Pain Loc --      Pain Edu? --      Excl. in [place]? --     Constitutional: Alert and oriented. In mild to moderate respiratory distress, speaking in short sentences. Eyes: Conjunctivae are normal. PERRL. EOMI. Head: Atraumatic. Nose: No congestion/rhinnorhea. Mouth/Throat: Mucous membranes are moist.  Oropharynx non-erythematous. Neck: No stridor.  Supple without meningismus. Cardiovascular: Normal rate, regular rhythm. Grossly normal heart sounds.  Good peripheral circulation. Respiratory: Tachypneic with increased work of breathing, diffuse expiratory wheeze with poor air movement. Gastrointestinal: Soft and nontender. No distention. No abdominal bruits. No CVA tenderness. Genitourinary: Deferred Musculoskeletal: No lower extremity tenderness nor edema.  No joint effusions. No calf swelling, tenderness or asymmetry. Neurologic:  Normal speech and language. No gross focal neurologic deficits are appreciated. No gait instability. Skin:  Skin is warm, dry and intact. No rash noted. Psychiatric: Mood and affect are normal. Speech and behavior are normal.  ____________________________________________   LABS (all labs ordered are listed, but only abnormal results are displayed)  Labs Reviewed  CBC WITH DIFFERENTIAL/PLATELET - Abnormal; Notable for the following:    Lymphs Abs 3.8 (*)      Basophils Absolute 0.2 (*)    All other components within normal limits  CULTURE, BLOOD (ROUTINE X 2)  CULTURE, BLOOD (ROUTINE X 2)  COMPREHENSIVE METABOLIC PANEL  TROPONIN I   ____________________________________________  EKG  ED ECG REPORT I, Joanne Gavel, the attending physician, personally viewed and interpreted this ECG.   Date: 07/14/2015  EKG Time: 19:36  Rate: 68  Rhythm: normal EKG, normal sinus rhythm  Axis: normal  Intervals:none  ST&T Change: No acute ST elevation.  ____________________________________________  RADIOLOGY  CXR IMPRESSION: No active cardiopulmonary disease.  ____________________________________________   PROCEDURES  Procedure(s) performed: None  Critical  Care performed: No  ____________________________________________   INITIAL IMPRESSION / ASSESSMENT AND PLAN / ED COURSE  Pertinent labs & imaging results that were available during my care of the patient were reviewed by me and considered in my medical decision making (see chart for details).  MARTHA GLENDE is a 50 y.o. female with history of COPD, GERD who presents for evaluation of 6 days of shortness of breath with wheezing. On exam, she is in mild to moderate respiratory distress with diffuse expiratory wheeze and increased work of breathing, no oxygen requirement. Clinical picture is consistent with COPD exacerbation. We'll treat with multiple DuoNeb treatments, steroids, azithromycin and reassess. EKG reassuring, not consistent with acute ischemia. Troponin negative. CBC and CMP are generally unremarkable. Chest x-ray shows no active cardio pulmonary disease. Patient is actively receiving breathing treatments at this time. Care is transferred to Dr. Reita Cliche at 8:45 PM pending reassessment and final disposition. ____________________________________________   FINAL CLINICAL IMPRESSION(S) / ED DIAGNOSES  Final diagnoses:  COPD exacerbation ([place])  SOB (shortness of breath)       NEW MEDICATIONS STARTED DURING THIS VISIT:  New Prescriptions   No medications on file     Note:  This document was prepared using Dragon voice recognition software and may include unintentional dictation errors.    Joanne Gavel, MD 07/14/15 2044

## 2015-07-14 NOTE — ED Notes (Signed)
Patient reports shortness of breath since last Monday.  Reports saw her MD on Tuesday given antibiotics and told to follow up in 3 days if not better.  Patient reports increased shortness of breath and cough.

## 2015-07-14 NOTE — ED Notes (Signed)
Pt. States SOB for 6 days.  Pt. States going to urgent care on Wednesday.  Pt. States treatment given was not affective.  Pt. States using home nebulizer treatment did not relive symptoms.

## 2015-07-14 NOTE — ED Notes (Signed)
Pt. Going home with mother.  Pt. States she will follow up with PCP within one week.  Will return here if condition worsens.

## 2015-07-14 NOTE — ED Notes (Signed)
Ambulated pt. Down hallway about 100 feet, pt. O2 sats stayed above 95 %.

## 2015-07-14 NOTE — Discharge Instructions (Signed)
You were evaluated for wheezing and trouble breathing and are being treated for COPD exacerbation. Continue to use albuterol nebulized every 4 hours as needed for wheezing and shortness breath. A prescription for prednisone is being added.  Return to the respiratory for any new or worsening condition including worsening shortness of breath, trouble breathing, chest pain, palpitations, fever, altered mental status or any other symptoms concerning to you.   Chronic Obstructive Pulmonary Disease Exacerbation Chronic obstructive pulmonary disease (COPD) is a common lung problem. In COPD, the flow of air from the lungs is limited. COPD exacerbations are times that breathing gets worse and you need extra treatment. Without treatment they can be life threatening. If they happen often, your lungs can become more damaged. If your COPD gets worse, your doctor may treat you with:  Medicines.  Oxygen.  Different ways to clear your airway, such as using a mask. HOME CARE  Do not smoke.  Avoid tobacco smoke and other things that bother your lungs.  If given, take your antibiotic medicine as told. Finish the medicine even if you start to feel better.  Only take medicines as told by your doctor.  Drink enough fluids to keep your pee (urine) clear or pale yellow (unless your doctor has told you not to).  Use a cool mist machine (vaporizer).  If you use oxygen or a machine that turns liquid medicine into a mist (nebulizer), continue to use them as told.  Keep up with shots (vaccinations) as told by your doctor.  Exercise regularly.  Eat healthy foods.  Keep all doctor visits as told. GET HELP RIGHT AWAY IF:  You are very short of breath and it gets worse.  You have trouble talking.  You have bad chest pain.  You have blood in your spit (sputum).  You have a fever.  You keep throwing up (vomiting).  You feel weak, or you pass out (faint).  You feel confused.  You keep getting  worse. MAKE SURE YOU:  Understand these instructions.  Will watch your condition.  Will get help right away if you are not doing well or get worse.   This information is not intended to replace advice given to you by your health care provider. Make sure you discuss any questions you have with your health care provider.   Document Released: 02/19/2011 Document Revised: 03/23/2014 Document Reviewed: 11/04/2012 Elsevier Interactive Patient Education [employer].

## 2015-07-18 ENCOUNTER — Ambulatory Visit (INDEPENDENT_AMBULATORY_CARE_PROVIDER_SITE_OTHER): Payer: BLUE CROSS/BLUE SHIELD | Admitting: Family Medicine

## 2015-07-18 ENCOUNTER — Encounter: Payer: Self-pay | Admitting: Family Medicine

## 2015-07-18 DIAGNOSIS — J441 Chronic obstructive pulmonary disease with (acute) exacerbation: Secondary | ICD-10-CM | POA: Diagnosis not present

## 2015-07-18 DIAGNOSIS — J449 Chronic obstructive pulmonary disease, unspecified: Secondary | ICD-10-CM | POA: Insufficient documentation

## 2015-07-18 MED ORDER — ALBUTEROL SULFATE (2.5 MG/3ML) 0.083% IN NEBU: 2.5000 mg | INHALATION_SOLUTION | Freq: Four times a day (QID) | RESPIRATORY_TRACT | Status: DC | PRN

## 2015-07-18 MED ORDER — UMECLIDINIUM-VILANTEROL 62.5-25 MCG/INH IN AEPB: 1.0000 | INHALATION_SPRAY | Freq: Every day | RESPIRATORY_TRACT | Status: DC

## 2015-07-18 NOTE — Assessment & Plan Note (Signed)
Brandi Acevedo is borderline normal, but with significant decrease for her age. Very symptomatic. Will start anoro and nebulizer Rx given. Call if not getting better. Recheck 1 month with spiro.

## 2015-07-18 NOTE — Progress Notes (Signed)
BP 122/72 mmHg  Pulse 70  Temp(Src) 98.3 F (36.8 C)  Wt 173 lb (78.472 kg)  SpO2 100%   Subjective:    Patient ID: Brandi Acevedo, female    DOB: 04-14-1965, 50 y.o.   MRN: [id number]  HPI: Brandi Acevedo is a 50 y.o. female  Chief Complaint  Patient presents with  . COPD   ER FOLLOW UP Time since discharge: 5 days Hospital/facility: [facility] Diagnosis: COPD exacerbation Procedures/tests: labs, EKG- normal, CXR- normal Consultants: None New medications: Prednisone Discharge instructions:  Follow up here, take medications Status: better  Relevant past medical, surgical, family and social history reviewed and updated as indicated. Interim medical history since our last visit reviewed. Allergies and medications reviewed and updated.  Review of Systems  Constitutional: Negative.   Respiratory: Positive for cough, chest tightness, shortness of breath and wheezing. Negative for apnea, choking and stridor.   Cardiovascular: Negative.   Psychiatric/Behavioral: Negative.     Per HPI unless specifically indicated above     Objective:    BP 122/72 mmHg  Pulse 70  Temp(Src) 98.3 F (36.8 C)  Wt 173 lb (78.472 kg)  SpO2 100%  Wt Readings from Last 3 Encounters:  07/18/15 173 lb (78.472 kg)  07/14/15 164 lb (74.39 kg)  06/04/15 172 lb (78.019 kg)    Physical Exam  Constitutional: She is oriented to person, place, and time. She appears well-developed and well-nourished. No distress.  HENT:  Head: Normocephalic and atraumatic.  Right Ear: Hearing normal.  Left Ear: Hearing normal.  Nose: Nose normal.  Eyes: Conjunctivae and lids are normal. Right eye exhibits no discharge. Left eye exhibits no discharge. No scleral icterus.  Cardiovascular: Normal rate, regular rhythm, normal heart sounds and intact distal pulses.  Exam reveals no gallop and no friction rub.   No murmur heard. Pulmonary/Chest: Effort normal and breath sounds normal. No respiratory distress. She has  no wheezes. She has no rales. She exhibits no tenderness.  Musculoskeletal: Normal range of motion.  Neurological: She is alert and oriented to person, place, and time.  Skin: Skin is warm, dry and intact. No rash noted. No erythema. No pallor.  Psychiatric: She has a normal mood and affect. Her speech is normal and behavior is normal. Judgment and thought content normal. Cognition and memory are normal.  Nursing note and vitals reviewed.   Results for orders placed or performed during the hospital encounter of 07/14/15  Blood culture (routine x 2)  Result Value Ref Range   Specimen Description BLOOD RIGHT ARM    Special Requests BOTTLES DRAWN AEROBIC AND ANAEROBIC [id number],5CCANA    Culture NO GROWTH 4 DAYS    Report Status PENDING   Blood culture (routine x 2)  Result Value Ref Range   Specimen Description BLOOD RIGHT FOREARM    Special Requests BOTTLES DRAWN AEROBIC AND ANAEROBIC [id number],5CCANA    Culture NO GROWTH 4 DAYS    Report Status PENDING   CBC with Differential  Result Value Ref Range   WBC 10.5 3.6 - 11.0 K/uL   RBC 4.10 3.80 - 5.20 MIL/uL   Hemoglobin 12.2 12.0 - 16.0 g/dL   HCT 36.5 35.0 - 47.0 %   MCV 89.2 80.0 - 100.0 fL   MCH 29.7 26.0 - 34.0 pg   MCHC 33.3 32.0 - 36.0 g/dL   RDW 13.6 11.5 - 14.5 %   Platelets 337 150 - 440 K/uL   Neutrophils Relative % 49 %   Lymphocytes Relative 36 %  Monocytes Relative 9 %   Eosinophils Relative 4 %   Basophils Relative 2 %   Neutro Abs 5.2 1.4 - 6.5 K/uL   Lymphs Abs 3.8 (H) 1.0 - 3.6 K/uL   Monocytes Absolute 0.9 0.2 - 0.9 K/uL   Eosinophils Absolute 0.4 0 - 0.7 K/uL   Basophils Absolute 0.2 (H) 0 - 0.1 K/uL   Smear Review MORPHOLOGY UNREMARKABLE   Comprehensive metabolic panel  Result Value Ref Range   Sodium 139 135 - 145 mmol/L   Potassium 3.6 3.5 - 5.1 mmol/L   Chloride 105 101 - 111 mmol/L   CO2 24 22 - 32 mmol/L   Glucose, Bld 94 65 - 99 mg/dL   BUN 9 6 - 20 mg/dL   Creatinine, Ser 0.88 0.44 - 1.00 mg/dL    Calcium 8.9 8.9 - 10.3 mg/dL   Total Protein 7.0 6.5 - 8.1 g/dL   Albumin 3.7 3.5 - 5.0 g/dL   AST 18 15 - 41 U/L   ALT 22 14 - 54 U/L   Alkaline Phosphatase 63 38 - 126 U/L   Total Bilirubin 0.4 0.3 - 1.2 mg/dL   GFR calc non Af Amer >60 >60 mL/min   GFR calc Af Amer >60 >60 mL/min   Anion gap 10 5 - 15  Troponin I  Result Value Ref Range   Troponin I <0.03 <0.031 ng/mL      Assessment & Plan:   Problem List Items Addressed This Visit      Respiratory   Chronic obstructive pulmonary disease (COPD) ([place])    Arlyce Harman is borderline normal, but with significant decrease for her age. Very symptomatic. Will start anoro and nebulizer Rx given. Call if not getting better. Recheck 1 month with spiro.       Relevant Medications   benzonatate (TESSALON) 200 MG capsule   albuterol (PROVENTIL) (2.5 MG/3ML) 0.083% nebulizer solution   umeclidinium-vilanterol (ANORO ELLIPTA) 62.5-25 MCG/INH AEPB   Other Relevant Orders   Spirometry with Graph (Completed)       Follow up plan: Return in about 4 weeks (around 08/15/2015) for Recheck breathing with spiro.

## 2015-07-19 LAB — CULTURE, BLOOD (ROUTINE X 2)
Culture: NO GROWTH
Culture: NO GROWTH

## 2015-07-22 ENCOUNTER — Telehealth: Payer: Self-pay | Admitting: Family Medicine

## 2015-07-22 ENCOUNTER — Ambulatory Visit: Payer: Self-pay | Admitting: Family Medicine

## 2015-07-22 MED ORDER — FLUCONAZOLE 150 MG PO TABS: 150.0000 mg | ORAL_TABLET | Freq: Once | ORAL | Status: DC

## 2015-07-22 NOTE — Telephone Encounter (Signed)
Pt would like to have something called in to [facility] for yeast infection. Pt already tried over the counter meds.

## 2015-07-22 NOTE — Telephone Encounter (Signed)
Rx sent to her pharmacy 

## 2015-08-27 ENCOUNTER — Ambulatory Visit: Payer: BLUE CROSS/BLUE SHIELD | Admitting: Family Medicine

## 2015-09-03 ENCOUNTER — Ambulatory Visit: Payer: BLUE CROSS/BLUE SHIELD | Admitting: Family Medicine

## 2015-10-24 ENCOUNTER — Encounter: Payer: Self-pay | Admitting: Family Medicine

## 2015-10-24 ENCOUNTER — Ambulatory Visit (INDEPENDENT_AMBULATORY_CARE_PROVIDER_SITE_OTHER): Payer: BLUE CROSS/BLUE SHIELD | Admitting: Family Medicine

## 2015-10-24 VITALS — BP 115/81 | HR 71 | Temp 98.5°F | Wt 171.0 lb

## 2015-10-24 DIAGNOSIS — I252 Old myocardial infarction: Secondary | ICD-10-CM | POA: Diagnosis not present

## 2015-10-24 DIAGNOSIS — G2581 Restless legs syndrome: Secondary | ICD-10-CM

## 2015-10-24 DIAGNOSIS — F329 Major depressive disorder, single episode, unspecified: Secondary | ICD-10-CM | POA: Diagnosis not present

## 2015-10-24 DIAGNOSIS — G5601 Carpal tunnel syndrome, right upper limb: Secondary | ICD-10-CM | POA: Diagnosis not present

## 2015-10-24 DIAGNOSIS — F32A Depression, unspecified: Secondary | ICD-10-CM

## 2015-10-24 MED ORDER — NAPROXEN 500 MG PO TABS: 500.0000 mg | ORAL_TABLET | Freq: Two times a day (BID) | ORAL | 0 refills | Status: DC

## 2015-10-24 MED ORDER — PREDNISONE 20 MG PO TABS: 40.0000 mg | ORAL_TABLET | Freq: Every day | ORAL | 0 refills | Status: DC

## 2015-10-24 MED ORDER — ROPINIROLE HCL 0.5 MG PO TABS: 0.5000 mg | ORAL_TABLET | Freq: Every day | ORAL | 0 refills | Status: DC

## 2015-10-24 MED ORDER — CITALOPRAM HYDROBROMIDE 10 MG PO TABS: 10.0000 mg | ORAL_TABLET | Freq: Every day | ORAL | 0 refills | Status: DC

## 2015-10-24 NOTE — Patient Instructions (Signed)
Follow up as needed

## 2015-10-24 NOTE — Progress Notes (Addendum)
BP 115/81   Pulse 71   Temp 98.5 F (36.9 C)   Wt 171 lb (77.6 kg)   SpO2 98%   BMI 32.31 kg/m    Subjective:    Patient ID: Brandi Acevedo, female    DOB: 09-12-1965, 50 y.o.   MRN: [id number]  HPI: Brandi Acevedo is a 50 y.o. female  Chief Complaint  Patient presents with  . Hand Pain    right hand and forearm numbness/tingling for a couple weeks. Swelling in the morning.  Not painful. No injury. Hurts to do anything with it, feels like she'll drop anything she holds.   Right hand numbness and tingling for about 2 weeks. States it is mainly in first 3 digits and goes up wrist toward elbow sometimes. Feels like her grip is weak since onset. Denies known injury, and no redness or swelling of hand. Works two jobs with large amount of repetitive motions. Has not tried taking anything for the symptoms. Worried about a blood clot as she previously had a stent placed s/p MI and has not taken her plavix for several years now.   Also requesting refills on her medications today. Take half of a 20 mg celexa nightly and doing well with that. Would like to increase her requip as it is helping but she is still having some RLS symptoms.   Relevant past medical, surgical, family and social history reviewed and updated as indicated. Interim medical history since our last visit reviewed. Allergies and medications reviewed and updated.  Review of Systems  HENT: Negative.   Respiratory: Negative.   Cardiovascular: Negative.   Gastrointestinal: Negative.   Musculoskeletal: Negative.   Skin: Negative.   Neurological: Positive for weakness and numbness.  Psychiatric/Behavioral: Negative.     Per HPI unless specifically indicated above     Objective:    BP 115/81   Pulse 71   Temp 98.5 F (36.9 C)   Wt 171 lb (77.6 kg)   SpO2 98%   BMI 32.31 kg/m   Wt Readings from Last 3 Encounters:  10/24/15 171 lb (77.6 kg)  07/18/15 173 lb (78.5 kg)  07/14/15 164 lb (74.4 kg)      Physical Exam  Constitutional: She is oriented to person, place, and time. She appears well-developed and well-nourished.  HENT:  Head: Atraumatic.  Eyes: Conjunctivae are normal. No scleral icterus.  Neck: Normal range of motion. Neck supple.  Cardiovascular: Normal rate, normal heart sounds and intact distal pulses.   Pulmonary/Chest: Effort normal. No respiratory distress.  Musculoskeletal: She exhibits no edema, tenderness or deformity.  Mildly decreased grip strength of right hand  Decreased sensation to light touch on right forearm/hand ROM full and intact Radial pulses full and equal b/l  Neurological: She is alert and oriented to person, place, and time.  Skin: Skin is warm and dry.  Psychiatric: She has a normal mood and affect. Her behavior is normal.  Nursing note and vitals reviewed.     Assessment & Plan:   Problem List Items Addressed This Visit      Other   RLS (restless legs syndrome)    Other Visit Diagnoses    Carpal tunnel syndrome of right wrist    -  Primary   5 day prednisone burst followed by naproxen BID, encouraged her to get a wrist brace at the drug store for this. She will f/u in 3-5 days if no improvement   Relevant Medications   rOPINIRole (REQUIP) 0.5 MG  tablet   citalopram (CELEXA) 10 MG tablet   Hx of myocardial infarction       Cariology referral placed, has been lost to f/u d/t insurance changes several years ago and no longer on her medications. Needs to re-establish.   Relevant Orders   Ambulatory referral to Cardiology   Depression       Sent in 10 mg as she has been cutting her 20 mg tabs nightly. Stable on current regimen   Relevant Medications   citalopram (CELEXA) 10 MG tablet    Increased requip to .5 mg nightly   Follow up plan: Return if symptoms worsen or fail to improve.

## 2015-10-26 ENCOUNTER — Other Ambulatory Visit: Payer: Self-pay | Admitting: Family Medicine

## 2015-11-22 ENCOUNTER — Other Ambulatory Visit: Payer: Self-pay | Admitting: Family Medicine

## 2015-11-26 ENCOUNTER — Telehealth: Payer: Self-pay | Admitting: Family Medicine

## 2015-11-26 MED ORDER — ROPINIROLE HCL 0.5 MG PO TABS: 0.5000 mg | ORAL_TABLET | Freq: Every day | ORAL | 1 refills | Status: DC

## 2015-11-26 NOTE — Telephone Encounter (Signed)
Routing to provider  

## 2015-11-26 NOTE — Telephone Encounter (Signed)
Rx sent to her pharmacy 

## 2015-11-26 NOTE — Telephone Encounter (Signed)
Pt would like to get a refill of rOPINIRole (REQUIP) 0.5 MG tablet sent to [facility]. She has a stress test scheduled for Sept 26th and she gets the results Oct 3rd. She has scheduled to come in Oct 3rd and would like enough medication to last until that appt.

## 2015-12-17 ENCOUNTER — Ambulatory Visit (INDEPENDENT_AMBULATORY_CARE_PROVIDER_SITE_OTHER): Payer: BLUE CROSS/BLUE SHIELD | Admitting: Family Medicine

## 2015-12-17 ENCOUNTER — Encounter: Payer: Self-pay | Admitting: Family Medicine

## 2015-12-17 VITALS — BP 111/75 | HR 66 | Temp 98.9°F | Wt 170.7 lb

## 2015-12-17 DIAGNOSIS — R202 Paresthesia of skin: Secondary | ICD-10-CM | POA: Diagnosis not present

## 2015-12-17 DIAGNOSIS — R252 Cramp and spasm: Secondary | ICD-10-CM

## 2015-12-17 DIAGNOSIS — G5603 Carpal tunnel syndrome, bilateral upper limbs: Secondary | ICD-10-CM

## 2015-12-17 DIAGNOSIS — J449 Chronic obstructive pulmonary disease, unspecified: Secondary | ICD-10-CM

## 2015-12-17 DIAGNOSIS — Z13220 Encounter for screening for lipoid disorders: Secondary | ICD-10-CM

## 2015-12-17 DIAGNOSIS — K219 Gastro-esophageal reflux disease without esophagitis: Secondary | ICD-10-CM

## 2015-12-17 DIAGNOSIS — F322 Major depressive disorder, single episode, severe without psychotic features: Secondary | ICD-10-CM

## 2015-12-17 DIAGNOSIS — Z833 Family history of diabetes mellitus: Secondary | ICD-10-CM

## 2015-12-17 DIAGNOSIS — Z720 Tobacco use: Secondary | ICD-10-CM

## 2015-12-17 DIAGNOSIS — G5600 Carpal tunnel syndrome, unspecified upper limb: Secondary | ICD-10-CM | POA: Insufficient documentation

## 2015-12-17 DIAGNOSIS — G2581 Restless legs syndrome: Secondary | ICD-10-CM

## 2015-12-17 LAB — UA/M W/RFLX CULTURE, ROUTINE
Bilirubin, UA: NEGATIVE
Glucose, UA: NEGATIVE
KETONES UA: NEGATIVE
Leukocytes, UA: NEGATIVE
NITRITE UA: NEGATIVE
PH UA: 5 (ref 5.0–7.5)
Protein, UA: NEGATIVE
RBC, UA: NEGATIVE
Specific Gravity, UA: <1.005 — ABNORMAL LOW (ref 1.005–1.030)
UUROB: 0.2 mg/dL (ref 0.2–1.0)

## 2015-12-17 MED ORDER — ROPINIROLE HCL 1 MG PO TABS: 1.0000 mg | ORAL_TABLET | Freq: Every day | ORAL | 3 refills | Status: DC

## 2015-12-17 MED ORDER — CITALOPRAM HYDROBROMIDE 10 MG PO TABS: 10.0000 mg | ORAL_TABLET | Freq: Every day | ORAL | 1 refills | Status: DC

## 2015-12-17 MED ORDER — PANTOPRAZOLE SODIUM 40 MG PO TBEC: 40.0000 mg | DELAYED_RELEASE_TABLET | Freq: Every day | ORAL | 1 refills | Status: DC

## 2015-12-17 MED ORDER — NICOTINE 14 MG/24HR TD PT24: 14.0000 mg | MEDICATED_PATCH | Freq: Every day | TRANSDERMAL | 0 refills | Status: DC

## 2015-12-17 MED ORDER — UMECLIDINIUM-VILANTEROL 62.5-25 MCG/INH IN AEPB: 1.0000 | INHALATION_SPRAY | Freq: Every day | RESPIRATORY_TRACT | 12 refills | Status: DC

## 2015-12-17 NOTE — Progress Notes (Signed)
BP 111/75 (BP Location: Left Arm, Patient Position: Sitting, Cuff Size: Large)   Pulse 66   Temp 98.9 F (37.2 C)   Wt 170 lb 11.2 oz (77.4 kg)   SpO2 97%   BMI 32.25 kg/m    Subjective:    Patient ID: Brandi Acevedo, female    DOB: January 12, 1966, 50 y.o.   MRN: [id number]  HPI: Brandi Acevedo is a 50 y.o. female  Chief Complaint  Patient presents with  . Depression  . Gastroesophageal Reflux  . RLS  . COPD  . Hand Pain   DEPRESSION- doing much better Mood status: controlled Satisfied with current treatment?: yes Symptom severity: mild  Duration of current treatment : months Side effects: no Medication compliance: excellent compliance Psychotherapy/counseling: no  Previous psychiatric medications: wellbutrin Depressed mood: yes Anxious mood: no Anhedonia: no Significant weight loss or gain: no Insomnia: no  Fatigue: yes Feelings of worthlessness or guilt: yes Impaired concentration/indecisiveness: yes Suicidal ideations: no Hopelessness: yes Crying spells: yes Depression screen [facility] 2/9 12/17/2015 06/04/2015 05/21/2015  Decreased Interest 1 2 2   Down, Depressed, Hopeless 0 1 3  PHQ - 2 Score 1 3 5   Altered sleeping 2 2 2   Tired, decreased energy 1 1 3   Change in appetite - 2 2  Feeling bad or failure about yourself  0 2 3  Trouble concentrating 1 1 1   Moving slowly or fidgety/restless 1 1 2   Suicidal thoughts 0 1 3  PHQ-9 Score 6 13 21   Difficult doing work/chores - Very difficult Very difficult   RESTLESS LEGS Duration: chronic Discomfort description:  Creeping, crawling and cramping Pain: yes Location: calves Bilateral: yes Symmetric: yes Severity: moderate Onset:  sudden Frequency:  constant Symptoms only occur while legs at rest: yes Sudden unintentional leg jerking: yes Bed partner bothered by leg movements: no LE numbness: no Decreased sensation: no Weakness: no Insomnia: no Daytime somnolence: no Fatigue: yes Status:  better  CARPAL TUNNEL Duration: months Involved hand/wrist: Bilateral, first 3 fingers on R hand and index finger on the L Pain: yes Severity: severe Quality: tingling severe pain Frequency: constant Radiation:  no Onset: sudden Paresthesias: yes Weakness: yes Mechanism of injury: no trauma Location: first 3 fingers on R hand and index finger on the L Aggravating factors gripping Alleviating factors: nothing Status: worse Treatments attempted: ice Relief with NSAIDs?: no EMG/NCT testing: no  GERD under good control, COPD under good control  SMOKING CESSATION Smoking Status: current every day smoker Smoking Amount: 1/2 ppd Smoking Onset: in teens Smoking Quit Date: not set Smoking triggers: waking up first thing, boredom, driving Type of tobacco use: cigarettes Children in the house: yes Other household members who smoke: yes Treatments attempted: chantix, wellbutrin, patches  Pneumovax: up to date  Relevant past medical, surgical, family and social history reviewed and updated as indicated. Interim medical history since our last visit reviewed. Allergies and medications reviewed and updated.  Review of Systems  Respiratory: Negative.   Cardiovascular: Negative.   Musculoskeletal: Positive for arthralgias and myalgias. Negative for back pain, gait problem, joint swelling, neck pain and neck stiffness.  Neurological: Positive for weakness and numbness. Negative for dizziness, tremors, seizures, syncope, facial asymmetry, speech difficulty, light-headedness and headaches.  Psychiatric/Behavioral: Negative.     Per HPI unless specifically indicated above     Objective:    BP 111/75 (BP Location: Left Arm, Patient Position: Sitting, Cuff Size: Large)   Pulse 66   Temp 98.9 F (37.2 C)  Wt 170 lb 11.2 oz (77.4 kg)   SpO2 97%   BMI 32.25 kg/m   Wt Readings from Last 3 Encounters:  12/17/15 170 lb 11.2 oz (77.4 kg)  10/24/15 171 lb (77.6 kg)  07/18/15 173 lb  (78.5 kg)    Physical Exam  Constitutional: She is oriented to person, place, and time. She appears well-developed and well-nourished. No distress.  HENT:  Head: Normocephalic and atraumatic.  Right Ear: Hearing normal.  Left Ear: Hearing normal.  Nose: Nose normal.  Eyes: Conjunctivae and lids are normal. Right eye exhibits no discharge. Left eye exhibits no discharge. No scleral icterus.  Cardiovascular: Normal rate, regular rhythm, normal heart sounds and intact distal pulses.  Exam reveals no gallop and no friction rub.   No murmur heard. Pulmonary/Chest: Effort normal. No respiratory distress. She has wheezes. She has no rales. She exhibits no tenderness.  Musculoskeletal: Normal range of motion.  Neurological: She is alert and oriented to person, place, and time.  + tinel's bilaterally  Skin: Skin is warm, dry and intact. No rash noted. She is not diaphoretic. No erythema. No pallor.  Psychiatric: She has a normal mood and affect. Her speech is normal and behavior is normal. Judgment and thought content normal. Cognition and memory are normal.  Nursing note and vitals reviewed.      Assessment & Plan:   Problem List Items Addressed This Visit      Respiratory   Chronic obstructive pulmonary disease (COPD) ([place])    Stable. Continue current regimen. Refills given today. Continue to monitor.       Relevant Medications   nicotine (NICODERM CQ) 14 mg/24hr patch   umeclidinium-vilanterol (ANORO ELLIPTA) 62.5-25 MCG/INH AEPB     Digestive   GERD (gastroesophageal reflux disease)    Stable. Continue current regimen. Refills given today. Continue to monitor.       Relevant Medications   pantoprazole (PROTONIX) 40 MG tablet     Nervous and Auditory   CTS (carpal tunnel syndrome)    Significantly bothered with this. Has not done better with braces, naproxen and exercises. May need EMG. Referral to neurology made today.      Relevant Medications   nicotine (NICODERM CQ) 14  mg/24hr patch   citalopram (CELEXA) 10 MG tablet   rOPINIRole (REQUIP) 1 MG tablet   Other Relevant Orders   Ambulatory referral to Neurology     Other   RLS (restless legs syndrome)    Will increase her requip to 1mg  daily and check on labs. Recheck 1 month. Call with concerns.       Relevant Orders   Ferritin   Tobacco abuse    Would like to try patches. Rx given today. Recheck 1 month to see how she's doing.       Depression, major, single episode, severe ([place]) - Primary    Doing much better on celexa. Will continue current regimen and will check back in 6 months.       Relevant Medications   citalopram (CELEXA) 10 MG tablet    Other Visit Diagnoses    Paresthesias       Labs checked today. Await results.    Relevant Orders   Comprehensive metabolic panel   Magnesium   Phosphorus   TSH   UA/M w/rflx Culture, Routine   CBC with Differential/Platelet   Screening for cholesterol level       Labs drawn today. Await results.    Relevant Orders   Comprehensive metabolic  panel   Phosphorus   TSH   Lipid Panel w/o Chol/HDL Ratio   CBC with Differential/Platelet   Family history of diabetes mellitus (DM)       Would like to be checked. Labs drawn today.   Relevant Orders   Comprehensive metabolic panel   Phosphorus   TSH   CBC with Differential/Platelet   Hgb A1c w/o eAG   Leg cramps       Checking labs today. Await results. Increase water and stretching.    Relevant Orders   Comprehensive metabolic panel   Magnesium   Phosphorus   TSH   CBC with Differential/Platelet       Follow up plan: Return in about 4 weeks (around 01/14/2016) for Physical (no labs)/follow up smoking and RLS.

## 2015-12-17 NOTE — Assessment & Plan Note (Signed)
Significantly bothered with this. Has not done better with braces, naproxen and exercises. May need EMG. Referral to neurology made today.

## 2015-12-17 NOTE — Assessment & Plan Note (Signed)
Will increase her requip to 1mg  daily and check on labs. Recheck 1 month. Call with concerns.

## 2015-12-17 NOTE — Assessment & Plan Note (Signed)
Stable. Continue current regimen. Refills given today. Continue to monitor.  

## 2015-12-17 NOTE — Assessment & Plan Note (Signed)
Doing much better on celexa. Will continue current regimen and will check back in 6 months.

## 2015-12-17 NOTE — Patient Instructions (Addendum)
Continue 10mg  celexa- we'll see how you do  Increase the requip to 1mg  at bed times, 2 pills at bedtime until you run out, then 1 pill of the new RxSmoking Cessation, Tips for Success If you are ready to quit smoking, congratulations! You have chosen to help yourself be healthier. Cigarettes bring nicotine, tar, carbon monoxide, and other irritants into your body. Your lungs, heart, and blood vessels will be able to work better without these poisons. There are many different ways to quit smoking. Nicotine gum, nicotine patches, a nicotine inhaler, or nicotine nasal spray can help with physical craving. Hypnosis, support groups, and medicines help break the habit of smoking. WHAT THINGS CAN I DO TO MAKE QUITTING EASIER?  Here are some tips to help you quit for good:  Pick a date when you will quit smoking completely. Tell all of your friends and family about your plan to quit on that date.  Do not try to slowly cut down on the number of cigarettes you are smoking. Pick a quit date and quit smoking completely starting on that day.  Throw away all cigarettes.   Clean and remove all ashtrays from your home, work, and car.  On a card, write down your reasons for quitting. Carry the card with you and read it when you get the urge to smoke.  Cleanse your body of nicotine. Drink enough water and fluids to keep your urine clear or pale yellow. Do this after quitting to flush the nicotine from your body.  Learn to predict your moods. Do not let a bad situation be your excuse to have a cigarette. Some situations in your life might tempt you into wanting a cigarette.  Never have "just one" cigarette. It leads to wanting another and another. Remind yourself of your decision to quit.  Change habits associated with smoking. If you smoked while driving or when feeling stressed, try other activities to replace smoking. Stand up when drinking your coffee. Brush your teeth after eating. Sit in a different chair  when you read the paper. Avoid alcohol while trying to quit, and try to drink fewer caffeinated beverages. Alcohol and caffeine may urge you to smoke.  Avoid foods and drinks that can trigger a desire to smoke, such as sugary or spicy foods and alcohol.  Ask people who smoke not to smoke around you.  Have something planned to do right after eating or having a cup of coffee. For example, plan to take a walk or exercise.  Try a relaxation exercise to calm you down and decrease your stress. Remember, you may be tense and nervous for the first 2 weeks after you quit, but this will pass.  Find new activities to keep your hands busy. Play with a pen, coin, or rubber band. Doodle or draw things on paper.  Brush your teeth right after eating. This will help cut down on the craving for the taste of tobacco after meals. You can also try mouthwash.   Use oral substitutes in place of cigarettes. Try using lemon drops, carrots, cinnamon sticks, or chewing gum. Keep them handy so they are available when you have the urge to smoke.  When you have the urge to smoke, try deep breathing.  Designate your home as a nonsmoking area.  If you are a heavy smoker, ask your health care provider about a prescription for nicotine chewing gum. It can ease your withdrawal from nicotine.  Reward yourself. Set aside the cigarette money you save and  buy yourself something nice.  Look for support from others. Join a support group or smoking cessation program. Ask someone at home or at work to help you with your plan to quit smoking.  Always ask yourself, "Do I need this cigarette or is this just a reflex?" Tell yourself, "Today, I choose not to smoke," or "I do not want to smoke." You are reminding yourself of your decision to quit.  Do not replace cigarette smoking with electronic cigarettes (commonly called e-cigarettes). The safety of e-cigarettes is unknown, and some may contain harmful chemicals.  If you relapse,  do not give up! Plan ahead and think about what you will do the next time you get the urge to smoke. HOW WILL I FEEL WHEN I QUIT SMOKING? You may have symptoms of withdrawal because your body is used to nicotine (the addictive substance in cigarettes). You may crave cigarettes, be irritable, feel very hungry, cough often, get headaches, or have difficulty concentrating. The withdrawal symptoms are only temporary. They are strongest when you first quit but will go away within 10-14 days. When withdrawal symptoms occur, stay in control. Think about your reasons for quitting. Remind yourself that these are signs that your body is healing and getting used to being without cigarettes. Remember that withdrawal symptoms are easier to treat than the major diseases that smoking can cause.  Even after the withdrawal is over, expect periodic urges to smoke. However, these cravings are generally short lived and will go away whether you smoke or not. Do not smoke! WHAT RESOURCES ARE AVAILABLE TO HELP ME QUIT SMOKING? Your health care provider can direct you to community resources or hospitals for support, which may include:  Group support.  Education.  Hypnosis.  Therapy.   This information is not intended to replace advice given to you by your health care provider. Make sure you discuss any questions you have with your health care provider.   Document Released: 11/29/2003 Document Revised: 03/23/2014 Document Reviewed: 08/18/2012 Elsevier Interactive Patient Education 2016 [employer] Leg Cramps Leg cramps occur when a muscle or muscles tighten and you have no control over this tightening (involuntary muscle contraction). Muscle cramps can develop in any muscle, but the most common place is in the calf muscles of the leg. Those cramps can occur during exercise or when you are at rest. Leg cramps are painful, and they may last for a few seconds to a few minutes. Cramps may return several times before they  finally stop. Usually, leg cramps are not caused by a serious medical problem. In many cases, the cause is not known. Some common causes include:  Overexertion.  Overuse from repetitive motions, or doing the same thing over and over.  Remaining in a certain position for a long period of time.  Improper preparation, form, or technique while performing a sport or an activity.  Dehydration.  Injury.  Side effects of some medicines.  Abnormally low levels of the salts and ions in your blood (electrolytes), especially potassium and calcium. These levels could be low if you are taking water pills (diuretics) or if you are pregnant. HOME CARE INSTRUCTIONS Watch your condition for any changes. Taking the following actions may help to lessen any discomfort that you are feeling:  Stay well-hydrated. Drink enough fluid to keep your urine clear or pale yellow.  Try massaging, stretching, and relaxing the affected muscle. Do this for several minutes at a time.  For tight or tense muscles, use a warm towel,  heating pad, or hot shower water directed to the affected area.  If you are sore or have pain after a cramp, applying ice to the affected area may relieve discomfort.  Put ice in a plastic bag.  Place a towel between your skin and the bag.  Leave the ice on for 20 minutes, 2-3 times per day.  Avoid strenuous exercise for several days if you have been having frequent leg cramps.  Make sure that your diet includes the essential minerals for your muscles to work normally.  Take medicines only as directed by your health care provider. SEEK MEDICAL CARE IF:  Your leg cramps get more severe or more frequent, or they do not improve over time.  Your foot becomes cold, numb, or blue.   This information is not intended to replace advice given to you by your health care provider. Make sure you discuss any questions you have with your health care provider.   Document Released: 04/09/2004  Document Revised: 07/17/2014 Document Reviewed: 02/07/2014 Elsevier Interactive Patient Education [employer].

## 2015-12-17 NOTE — Assessment & Plan Note (Signed)
Would like to try patches. Rx given today. Recheck 1 month to see how she's doing.

## 2015-12-18 ENCOUNTER — Encounter: Payer: Self-pay | Admitting: Family Medicine

## 2015-12-18 DIAGNOSIS — R7301 Impaired fasting glucose: Secondary | ICD-10-CM | POA: Insufficient documentation

## 2015-12-18 DIAGNOSIS — E785 Hyperlipidemia, unspecified: Secondary | ICD-10-CM | POA: Insufficient documentation

## 2015-12-18 DIAGNOSIS — R7989 Other specified abnormal findings of blood chemistry: Secondary | ICD-10-CM | POA: Insufficient documentation

## 2015-12-18 LAB — COMPREHENSIVE METABOLIC PANEL
A/G RATIO: 1.5 (ref 1.2–2.2)
ALT: 25 IU/L (ref 0–32)
AST: 16 IU/L (ref 0–40)
Albumin: 4.2 g/dL (ref 3.5–5.5)
Alkaline Phosphatase: 76 IU/L (ref 39–117)
BUN/Creatinine Ratio: 13 (ref 9–23)
BUN: 9 mg/dL (ref 6–24)
Bilirubin Total: 0.3 mg/dL (ref 0.0–1.2)
CALCIUM: 9.4 mg/dL (ref 8.7–10.2)
CHLORIDE: 100 mmol/L (ref 96–106)
CO2: 27 mmol/L (ref 18–29)
Creatinine, Ser: 0.69 mg/dL (ref 0.57–1.00)
GFR calc Af Amer: 118 mL/min/{1.73_m2} (ref >59)
GFR, EST NON AFRICAN AMERICAN: 103 mL/min/{1.73_m2} (ref >59)
Globulin, Total: 2.8 g/dL (ref 1.5–4.5)
Glucose: 90 mg/dL (ref 65–99)
POTASSIUM: 4.9 mmol/L (ref 3.5–5.2)
Sodium: 140 mmol/L (ref 134–144)
Total Protein: 7 g/dL (ref 6.0–8.5)

## 2015-12-18 LAB — MAGNESIUM: Magnesium: 1.9 mg/dL (ref 1.6–2.3)

## 2015-12-18 LAB — CBC WITH DIFFERENTIAL/PLATELET
BASOS: 3 %
Basophils Absolute: 0.2 10*3/uL (ref 0.0–0.2)
EOS (ABSOLUTE): 0.3 10*3/uL (ref 0.0–0.4)
EOS: 4 %
HEMATOCRIT: 42.6 % (ref 34.0–46.6)
Hemoglobin: 14 g/dL (ref 11.1–15.9)
IMMATURE GRANULOCYTES: 0 %
Immature Grans (Abs): 0 10*3/uL (ref 0.0–0.1)
LYMPHS: 40 %
Lymphocytes Absolute: 3.1 10*3/uL (ref 0.7–3.1)
MCH: 28.9 pg (ref 26.6–33.0)
MCHC: 32.9 g/dL (ref 31.5–35.7)
MCV: 88 fL (ref 79–97)
MONOCYTES: 5 %
Monocytes Absolute: 0.4 10*3/uL (ref 0.1–0.9)
NEUTROS PCT: 48 %
Neutrophils Absolute: 3.7 10*3/uL (ref 1.4–7.0)
Platelets: 331 10*3/uL (ref 150–379)
RBC: 4.85 x10E6/uL (ref 3.77–5.28)
RDW: 14 % (ref 12.3–15.4)
WBC: 7.7 10*3/uL (ref 3.4–10.8)

## 2015-12-18 LAB — PHOSPHORUS: PHOSPHORUS: 4.3 mg/dL (ref 2.5–4.5)

## 2015-12-18 LAB — LIPID PANEL W/O CHOL/HDL RATIO
Cholesterol, Total: 256 mg/dL — ABNORMAL HIGH (ref 100–199)
HDL: 32 mg/dL — ABNORMAL LOW (ref >39)
LDL Calculated: 156 mg/dL — ABNORMAL HIGH (ref 0–99)
Triglycerides: 338 mg/dL — ABNORMAL HIGH (ref 0–149)
VLDL Cholesterol Cal: 68 mg/dL — ABNORMAL HIGH (ref 5–40)

## 2015-12-18 LAB — HGB A1C W/O EAG: Hgb A1c MFr Bld: 6.1 % — ABNORMAL HIGH (ref 4.8–5.6)

## 2015-12-18 LAB — FERRITIN: Ferritin: 169 ng/mL — ABNORMAL HIGH (ref 15–150)

## 2015-12-18 LAB — TSH: TSH: 3.01 u[IU]/mL (ref 0.450–4.500)

## 2016-01-22 ENCOUNTER — Encounter: Payer: Self-pay | Admitting: Family Medicine

## 2016-01-22 ENCOUNTER — Ambulatory Visit (INDEPENDENT_AMBULATORY_CARE_PROVIDER_SITE_OTHER): Payer: BLUE CROSS/BLUE SHIELD | Admitting: Family Medicine

## 2016-01-22 VITALS — BP 114/78 | HR 67 | Temp 98.7°F | Ht 61.0 in | Wt 168.5 lb

## 2016-01-22 DIAGNOSIS — G2581 Restless legs syndrome: Secondary | ICD-10-CM

## 2016-01-22 DIAGNOSIS — Z1239 Encounter for other screening for malignant neoplasm of breast: Secondary | ICD-10-CM

## 2016-01-22 DIAGNOSIS — J441 Chronic obstructive pulmonary disease with (acute) exacerbation: Secondary | ICD-10-CM

## 2016-01-22 DIAGNOSIS — R202 Paresthesia of skin: Secondary | ICD-10-CM | POA: Diagnosis not present

## 2016-01-22 DIAGNOSIS — Z1231 Encounter for screening mammogram for malignant neoplasm of breast: Secondary | ICD-10-CM

## 2016-01-22 DIAGNOSIS — Z720 Tobacco use: Secondary | ICD-10-CM

## 2016-01-22 LAB — UA/M W/RFLX CULTURE, ROUTINE
BILIRUBIN UA: NEGATIVE
GLUCOSE, UA: NEGATIVE
Ketones, UA: NEGATIVE
Leukocytes, UA: NEGATIVE
Nitrite, UA: NEGATIVE
PH UA: 5.5 (ref 5.0–7.5)
Protein, UA: NEGATIVE
RBC, UA: NEGATIVE
Specific Gravity, UA: >1.03 — ABNORMAL HIGH (ref 1.005–1.030)
UUROB: 0.2 mg/dL (ref 0.2–1.0)

## 2016-01-22 MED ORDER — AZITHROMYCIN 250 MG PO TABS: ORAL_TABLET | ORAL | 0 refills | Status: DC

## 2016-01-22 MED ORDER — PREDNISONE 10 MG PO TABS: ORAL_TABLET | ORAL | 0 refills | Status: DC

## 2016-01-22 MED ORDER — HYDROCOD POLST-CPM POLST ER 10-8 MG/5ML PO SUER: 5.0000 mL | Freq: Every evening | ORAL | 0 refills | Status: DC | PRN

## 2016-01-22 MED ORDER — ALBUTEROL SULFATE (2.5 MG/3ML) 0.083% IN NEBU: 2.5000 mg | INHALATION_SOLUTION | Freq: Once | RESPIRATORY_TRACT | Status: DC

## 2016-01-22 MED ORDER — NICOTINE POLACRILEX 4 MG MT GUM: 4.0000 mg | CHEWING_GUM | OROMUCOSAL | 0 refills | Status: DC | PRN

## 2016-01-22 NOTE — Assessment & Plan Note (Signed)
Does not want to do wellbutrin or chantix. Could not tolerate patches. Will try gum. Rx sent to her pharmacy.

## 2016-01-22 NOTE — Progress Notes (Signed)
BP 114/78 (BP Location: Left Arm, Patient Position: Sitting, Cuff Size: Large)   Pulse 67   Temp 98.7 F (37.1 C)   Ht 5\' 1"  (1.549 m)   Wt 168 lb 8 oz (76.4 kg)   SpO2 98%   BMI 31.84 kg/m    Subjective:    Patient ID: Brandi Acevedo, female    DOB: June 18, 1965, 50 y.o.   MRN: [id number]  HPI: Brandi Acevedo is a 50 y.o. female  Chief Complaint  Patient presents with  . URI   UPPER RESPIRATORY TRACT INFECTION Duration: 3-4 days Worst symptom: cough Fever: no Cough: yes Shortness of breath: yes Wheezing: yes Chest pain: yes, with cough Chest tightness: yes Chest congestion: yes Nasal congestion: no Runny nose: no Post nasal drip: yes Sneezing: yes Sore throat: no Swollen glands: no Sinus pressure: no Headache: yes Face pain: no Toothache: no Ear pain: yes "right Ear pressure: yes "right Eyes red/itching:no Eye drainage/crusting: no  Vomiting: yes Rash: no Fatigue: yes Sick contacts: yes Strep contacts: no  Context: stable Recurrent sinusitis: no Relief with OTC cold/cough medications: no  Treatments attempted: tessalon perles, cold/sinus, mucinex, anti-histamine, pseudoephedrine and cough syrup   RESTLESS LEGS- better on the higher dose of her requip. Following with Dr. Manuella Ghazi. To have labs and a EMG.   SMOKING CESSATION- patches didn't help, they fell off Smoking Status: current every day smoker Smoking Amount: 1/2 ppd Smoking Onset: in teens Smoking Quit Date: not set Smoking triggers: waking up first thing, boredom, driving Type of tobacco use: cigarettes Children in the house: yes Other household members who smoke: yes Treatments attempted: chantix, wellbutrin, patches  Pneumovax: up to date  Relevant past medical, surgical, family and social history reviewed and updated as indicated. Interim medical history since our last visit reviewed. Allergies and medications reviewed and updated.  Review of Systems  Constitutional: Positive for  fatigue. Negative for activity change, appetite change, chills, diaphoresis, fever and unexpected weight change.  HENT: Positive for congestion, postnasal drip and sinus pressure. Negative for dental problem, drooling, ear discharge, ear pain, facial swelling, hearing loss, mouth sores, nosebleeds, rhinorrhea, sinus pain, sneezing, sore throat, tinnitus, trouble swallowing and voice change.   Eyes: Negative.   Respiratory: Positive for cough, shortness of breath and wheezing. Negative for apnea, choking, chest tightness and stridor.   Cardiovascular: Negative.   Psychiatric/Behavioral: Negative.     Per HPI unless specifically indicated above     Objective:    BP 114/78 (BP Location: Left Arm, Patient Position: Sitting, Cuff Size: Large)   Pulse 67   Temp 98.7 F (37.1 C)   Ht 5\' 1"  (1.549 m)   Wt 168 lb 8 oz (76.4 kg)   SpO2 98%   BMI 31.84 kg/m   Wt Readings from Last 3 Encounters:  01/22/16 168 lb 8 oz (76.4 kg)  12/17/15 170 lb 11.2 oz (77.4 kg)  10/24/15 171 lb (77.6 kg)    Physical Exam  Constitutional: She is oriented to person, place, and time. She appears well-developed and well-nourished. No distress.  HENT:  Head: Normocephalic and atraumatic.  Right Ear: Hearing and external ear normal.  Left Ear: Hearing and external ear normal.  Nose: Nose normal.  Mouth/Throat: Oropharynx is clear and moist. No oropharyngeal exudate.  Eyes: Conjunctivae, EOM and lids are normal. Pupils are equal, round, and reactive to light. Right eye exhibits no discharge. Left eye exhibits no discharge. No scleral icterus.  Neck: Normal range of motion. Neck  supple. No JVD present. No tracheal deviation present. No thyromegaly present.  Cardiovascular: Normal rate, regular rhythm, normal heart sounds and intact distal pulses.  Exam reveals no gallop and no friction rub.   No murmur heard. Pulmonary/Chest: Effort normal. No stridor. No respiratory distress. She has wheezes. She has no rales.  She exhibits no tenderness.  Musculoskeletal: Normal range of motion.  Lymphadenopathy:    She has no cervical adenopathy.  Neurological: She is alert and oriented to person, place, and time.  Skin: Skin is warm, dry and intact. No rash noted. She is not diaphoretic. No erythema.  Psychiatric: She has a normal mood and affect. Her speech is normal and behavior is normal. Judgment and thought content normal. Cognition and memory are normal.  Nursing note and vitals reviewed.   Results for orders placed or performed in visit on 12/17/15  Comprehensive metabolic panel  Result Value Ref Range   Glucose 90 65 - 99 mg/dL   BUN 9 6 - 24 mg/dL   Creatinine, Ser 0.69 0.57 - 1.00 mg/dL   GFR calc non Af Amer 103 >59 mL/min/1.73   GFR calc Af Amer 118 >59 mL/min/1.73   BUN/Creatinine Ratio 13 9 - 23   Sodium 140 134 - 144 mmol/L   Potassium 4.9 3.5 - 5.2 mmol/L   Chloride 100 96 - 106 mmol/L   CO2 27 18 - 29 mmol/L   Calcium 9.4 8.7 - 10.2 mg/dL   Total Protein 7.0 6.0 - 8.5 g/dL   Albumin 4.2 3.5 - 5.5 g/dL   Globulin, Total 2.8 1.5 - 4.5 g/dL   Albumin/Globulin Ratio 1.5 1.2 - 2.2   Bilirubin Total 0.3 0.0 - 1.2 mg/dL   Alkaline Phosphatase 76 39 - 117 IU/L   AST 16 0 - 40 IU/L   ALT 25 0 - 32 IU/L  Magnesium  Result Value Ref Range   Magnesium 1.9 1.6 - 2.3 mg/dL  Phosphorus  Result Value Ref Range   Phosphorus 4.3 2.5 - 4.5 mg/dL  TSH  Result Value Ref Range   TSH 3.010 0.450 - 4.500 uIU/mL  Lipid Panel w/o Chol/HDL Ratio  Result Value Ref Range   Cholesterol, Total 256 (H) 100 - 199 mg/dL   Triglycerides 338 (H) 0 - 149 mg/dL   HDL 32 (L) >39 mg/dL   VLDL Cholesterol Cal 68 (H) 5 - 40 mg/dL   LDL Calculated 156 (H) 0 - 99 mg/dL  UA/M w/rflx Culture, Routine  Result Value Ref Range   Specific Gravity, UA <1.005 (L) 1.005 - 1.030   pH, UA 5.0 5.0 - 7.5   Color, UA Yellow Yellow   Appearance Ur Clear Clear   Leukocytes, UA Negative Negative   Protein, UA Negative  Negative/Trace   Glucose, UA Negative Negative   Ketones, UA Negative Negative   RBC, UA Negative Negative   Bilirubin, UA Negative Negative   Urobilinogen, Ur 0.2 0.2 - 1.0 mg/dL   Nitrite, UA Negative Negative  CBC with Differential/Platelet  Result Value Ref Range   WBC 7.7 3.4 - 10.8 x10E3/uL   RBC 4.85 3.77 - 5.28 x10E6/uL   Hemoglobin 14.0 11.1 - 15.9 g/dL   Hematocrit 42.6 34.0 - 46.6 %   MCV 88 79 - 97 fL   MCH 28.9 26.6 - 33.0 pg   MCHC 32.9 31.5 - 35.7 g/dL   RDW 14.0 12.3 - 15.4 %   Platelets 331 150 - 379 x10E3/uL   Neutrophils 48 Not Estab. %  Lymphs 40 Not Estab. %   Monocytes 5 Not Estab. %   Eos 4 Not Estab. %   Basos 3 Not Estab. %   Neutrophils Absolute 3.7 1.4 - 7.0 x10E3/uL   Lymphocytes Absolute 3.1 0.7 - 3.1 x10E3/uL   Monocytes Absolute 0.4 0.1 - 0.9 x10E3/uL   EOS (ABSOLUTE) 0.3 0.0 - 0.4 x10E3/uL   Basophils Absolute 0.2 0.0 - 0.2 x10E3/uL   Immature Granulocytes 0 Not Estab. %   Immature Grans (Abs) 0.0 0.0 - 0.1 x10E3/uL  Hgb A1c w/o eAG  Result Value Ref Range   Hgb A1c MFr Bld 6.1 (H) 4.8 - 5.6 %  Ferritin  Result Value Ref Range   Ferritin 169 (H) 15 - 150 ng/mL      Assessment & Plan:   Problem List Items Addressed This Visit      Other   RLS (restless legs syndrome)    Doing better with the requip. Continue current regimen. Continue to monitor. Continue to follow with Dr. Manuella Ghazi.      Tobacco abuse    Does not want to do wellbutrin or chantix. Could not tolerate patches. Will try gum. Rx sent to her pharmacy.      Relevant Orders   UA/M w/rflx Culture, Routine    Other Visit Diagnoses    COPD with acute exacerbation ([place])    -  Primary   Will treat with z-pack and prednisone taper. Tussionex for comfort. Recheck 2 weeks at physical    Relevant Medications   cetirizine (ZYRTEC) 10 MG tablet   guaiFENesin (MUCINEX) 600 MG 12 hr tablet   albuterol (PROVENTIL) (2.5 MG/3ML) 0.083% nebulizer solution 2.5 mg   azithromycin  (ZITHROMAX) 250 MG tablet   nicotine polacrilex (NICORETTE) 4 MG gum   chlorpheniramine-HYDROcodone (TUSSIONEX PENNKINETIC ER) 10-8 MG/5ML SUER   predniSONE (DELTASONE) 10 MG tablet   Screening for breast cancer       Mammogram ordered today.   Paresthesias       Needs labs for Dr. Manuella Ghazi. Drawn today.    Relevant Orders   B12 and Folate Panel   VITAMIN D 25 Hydroxy (Vit-D Deficiency, Fractures)   HIV antibody   Methylmalonic Acid       Follow up plan: Return in about 2 weeks (around 02/05/2016) for Lung recheck and physical.

## 2016-01-22 NOTE — Assessment & Plan Note (Signed)
Doing better with the requip. Continue current regimen. Continue to monitor. Continue to follow with Dr. Manuella Ghazi.

## 2016-01-22 NOTE — Progress Notes (Deleted)
BP 114/78 (BP Location: Left Arm, Patient Position: Sitting, Cuff Size: Large)   Pulse 67   Temp 98.7 F (37.1 C)   Ht 5\' 1"  (1.549 m)   Wt 168 lb 8 oz (76.4 kg)   SpO2 98%   BMI 31.84 kg/m    Subjective:    Patient ID: Brandi Acevedo, female    DOB: Feb 13, 1966, 50 y.o.   MRN: [id number]  HPI: Brandi Acevedo is a 50 y.o. female presenting on 01/22/2016 for comprehensive medical examination. Current medical complaints include:{Blank single:19197::"none","***"}  She currently lives with: Menopausal Symptoms: {Blank single:19197::"yes","no"}  Depression Screen done today and results listed below:  Depression screen [facility] 2/9 12/17/2015 06/04/2015 05/21/2015  Decreased Interest 1 2 2   Down, Depressed, Hopeless 0 1 3  PHQ - 2 Score 1 3 5   Altered sleeping 2 2 2   Tired, decreased energy 1 1 3   Change in appetite - 2 2  Feeling bad or failure about yourself  0 2 3  Trouble concentrating 1 1 1   Moving slowly or fidgety/restless 1 1 2   Suicidal thoughts 0 1 3  PHQ-9 Score 6 13 21   Difficult doing work/chores - Very difficult Very difficult    Past Medical History:  Past Medical History:  Diagnosis Date  . COPD (chronic obstructive pulmonary disease) ([place])   . Hearing loss   . Heart attack   . IBS (irritable bowel syndrome)   . RLS (restless legs syndrome)   . Sleep apnea     Surgical History:  Past Surgical History:  Procedure Laterality Date  . ABDOMINAL HYSTERECTOMY    . ACHILLES TENDON REPAIR    . CESAREAN SECTION     X 3  . CHOLECYSTECTOMY    . DILATION AND CURETTAGE OF UTERUS    . HERNIA REPAIR    . TONSILLECTOMY    . TYMPANOSTOMY TUBE PLACEMENT     X 2    Medications:  Current Outpatient Prescriptions on File Prior to Visit  Medication Sig  . albuterol (PROVENTIL) (2.5 MG/3ML) 0.083% nebulizer solution Take 3 mLs (2.5 mg total) by nebulization every 6 (six) hours as needed for wheezing or shortness of breath.  Marland Kitchen aspirin EC 81 MG tablet Take by mouth.    . citalopram (CELEXA) 10 MG tablet Take 1 tablet (10 mg total) by mouth daily.  . pantoprazole (PROTONIX) 40 MG tablet Take 1 tablet (40 mg total) by mouth daily.  Marland Kitchen rOPINIRole (REQUIP) 1 MG tablet Take 1 tablet (1 mg total) by mouth at bedtime.  Marland Kitchen umeclidinium-vilanterol (ANORO ELLIPTA) 62.5-25 MCG/INH AEPB Inhale 1 puff into the lungs daily.   No current facility-administered medications on file prior to visit.     Allergies:  Allergies  Allergen Reactions  . Ativan [Lorazepam] Other (See Comments)    Altered mental status  . Atorvastatin Other (See Comments)    Causes seizures  . Codeine     Gi upset  . Wellbutrin [Bupropion] Other (See Comments)    Depression  . Flagyl [Metronidazole] Rash  . Keflex [Cephalexin] Rash  . Septra [Sulfamethoxazole-Trimethoprim] Rash    Social History:  Social History   Social History  . Marital status: Single    Spouse name: N/A  . Number of children: N/A  . Years of education: N/A   Occupational History  . Not on file.   Social History Main Topics  . Smoking status: Current Every Day Smoker    Packs/day: 0.50    Types: Cigarettes  Last attempt to quit: 07/14/2015  . Smokeless tobacco: Never Used  . Alcohol use No  . Drug use: No  . Sexual activity: Not Currently   Other Topics Concern  . Not on file   Social History Narrative  . No narrative on file   History  Smoking Status  . Current Every Day Smoker  . Packs/day: 0.50  . Types: Cigarettes  . Last attempt to quit: 07/14/2015  Smokeless Tobacco  . Never Used   History  Alcohol Use No    Family History:  Family History  Problem Relation Age of Onset  . Diabetes Mother   . Restless legs syndrome Mother   . Sleep apnea Mother   . Hypertension Mother   . Hyperlipidemia Mother   . Diabetes Father   . Hypertension Father   . Hyperlipidemia Father   . Heart disease Father   . Heart attack Father   . Diabetes Sister   . Hyperlipidemia Sister   .  Hypertension Sister   . Heart disease Sister   . Heart attack Sister   . Hyperlipidemia Daughter   . Mental illness Daughter     Bipolar Disorder  . Heart disease Maternal Grandmother   . Stroke Maternal Grandfather   . Heart attack Maternal Grandfather   . Cancer Paternal Grandmother   . Hyperlipidemia Daughter     Past medical history, surgical history, medications, allergies, family history and social history reviewed with patient today and changes made to appropriate areas of the chart.   ROS  All other ROS negative except what is listed above and in the HPI.      Objective:    BP 114/78 (BP Location: Left Arm, Patient Position: Sitting, Cuff Size: Large)   Pulse 67   Temp 98.7 F (37.1 C)   Ht 5\' 1"  (1.549 m)   Wt 168 lb 8 oz (76.4 kg)   SpO2 98%   BMI 31.84 kg/m   Wt Readings from Last 3 Encounters:  01/22/16 168 lb 8 oz (76.4 kg)  12/17/15 170 lb 11.2 oz (77.4 kg)  10/24/15 171 lb (77.6 kg)    Physical Exam  Results for orders placed or performed in visit on 12/17/15  Comprehensive metabolic panel  Result Value Ref Range   Glucose 90 65 - 99 mg/dL   BUN 9 6 - 24 mg/dL   Creatinine, Ser 0.69 0.57 - 1.00 mg/dL   GFR calc non Af Amer 103 >59 mL/min/1.73   GFR calc Af Amer 118 >59 mL/min/1.73   BUN/Creatinine Ratio 13 9 - 23   Sodium 140 134 - 144 mmol/L   Potassium 4.9 3.5 - 5.2 mmol/L   Chloride 100 96 - 106 mmol/L   CO2 27 18 - 29 mmol/L   Calcium 9.4 8.7 - 10.2 mg/dL   Total Protein 7.0 6.0 - 8.5 g/dL   Albumin 4.2 3.5 - 5.5 g/dL   Globulin, Total 2.8 1.5 - 4.5 g/dL   Albumin/Globulin Ratio 1.5 1.2 - 2.2   Bilirubin Total 0.3 0.0 - 1.2 mg/dL   Alkaline Phosphatase 76 39 - 117 IU/L   AST 16 0 - 40 IU/L   ALT 25 0 - 32 IU/L  Magnesium  Result Value Ref Range   Magnesium 1.9 1.6 - 2.3 mg/dL  Phosphorus  Result Value Ref Range   Phosphorus 4.3 2.5 - 4.5 mg/dL  TSH  Result Value Ref Range   TSH 3.010 0.450 - 4.500 uIU/mL  Lipid Panel w/o Chol/HDL  Ratio  Result Value Ref Range   Cholesterol, Total 256 (H) 100 - 199 mg/dL   Triglycerides 338 (H) 0 - 149 mg/dL   HDL 32 (L) >39 mg/dL   VLDL Cholesterol Cal 68 (H) 5 - 40 mg/dL   LDL Calculated 156 (H) 0 - 99 mg/dL  UA/M w/rflx Culture, Routine  Result Value Ref Range   Specific Gravity, UA <1.005 (L) 1.005 - 1.030   pH, UA 5.0 5.0 - 7.5   Color, UA Yellow Yellow   Appearance Ur Clear Clear   Leukocytes, UA Negative Negative   Protein, UA Negative Negative/Trace   Glucose, UA Negative Negative   Ketones, UA Negative Negative   RBC, UA Negative Negative   Bilirubin, UA Negative Negative   Urobilinogen, Ur 0.2 0.2 - 1.0 mg/dL   Nitrite, UA Negative Negative  CBC with Differential/Platelet  Result Value Ref Range   WBC 7.7 3.4 - 10.8 x10E3/uL   RBC 4.85 3.77 - 5.28 x10E6/uL   Hemoglobin 14.0 11.1 - 15.9 g/dL   Hematocrit 42.6 34.0 - 46.6 %   MCV 88 79 - 97 fL   MCH 28.9 26.6 - 33.0 pg   MCHC 32.9 31.5 - 35.7 g/dL   RDW 14.0 12.3 - 15.4 %   Platelets 331 150 - 379 x10E3/uL   Neutrophils 48 Not Estab. %   Lymphs 40 Not Estab. %   Monocytes 5 Not Estab. %   Eos 4 Not Estab. %   Basos 3 Not Estab. %   Neutrophils Absolute 3.7 1.4 - 7.0 x10E3/uL   Lymphocytes Absolute 3.1 0.7 - 3.1 x10E3/uL   Monocytes Absolute 0.4 0.1 - 0.9 x10E3/uL   EOS (ABSOLUTE) 0.3 0.0 - 0.4 x10E3/uL   Basophils Absolute 0.2 0.0 - 0.2 x10E3/uL   Immature Granulocytes 0 Not Estab. %   Immature Grans (Abs) 0.0 0.0 - 0.1 x10E3/uL  Hgb A1c w/o eAG  Result Value Ref Range   Hgb A1c MFr Bld 6.1 (H) 4.8 - 5.6 %  Ferritin  Result Value Ref Range   Ferritin 169 (H) 15 - 150 ng/mL      Assessment & Plan:   Problem List Items Addressed This Visit      Digestive   GERD (gastroesophageal reflux disease)   Relevant Orders   CBC with Differential/Platelet   Comprehensive metabolic panel     Endocrine   IFG (impaired fasting glucose)   Relevant Orders   Bayer DCA Hb A1c Waived   Comprehensive  metabolic panel     Other   Tobacco abuse   Relevant Orders   CBC with Differential/Platelet   Comprehensive metabolic panel   UA/M w/rflx Culture, Routine   Depression, major, single episode, severe ([facility])   Relevant Orders   Comprehensive metabolic panel   Hyperlipidemia   Relevant Orders   Comprehensive metabolic panel   Elevated ferritin   Relevant Orders   Comprehensive metabolic panel   Ferritin   Iron and TIBC    Other Visit Diagnoses    Routine general medical examination at a health care facility    -  Primary   Relevant Orders   Bayer DCA Hb A1c Waived   CBC with Differential/Platelet   Comprehensive metabolic panel   UA/M w/rflx Culture, Routine   Ferritin   Iron and TIBC       Follow up plan: No Follow-up on file.   LABORATORY TESTING:  - Pap smear: not applicable  IMMUNIZATIONS:   - Tdap: Tetanus vaccination  status reviewed: last tetanus booster within 10 years. - Influenza: {Blank single:19197::"Up to date","Administered today","Postponed to flu season","Refused","Given elsewhere"} - Pneumovax: {Blank single:19197::"Up to date","Administered today","Not applicable","Refused","Given elsewhere"} - Prevnar: Not applicable - Zostavax vaccine: Will check with her insurance  SCREENING: -Mammogram: Ordered today  - Colonoscopy: Ordered today  - Bone Density: Not applicable  -Hearing Test: Not applicable  -Spirometry: Done previously   PATIENT COUNSELING:   Advised to take 1 mg of folate supplement per day if capable of pregnancy.   Sexuality: Discussed sexually transmitted diseases, partner selection, use of condoms, avoidance of unintended pregnancy  and contraceptive alternatives.   Advised to avoid cigarette smoking.  I discussed with the patient that most people either abstain from alcohol or drink within safe limits (<=14/week and <=4 drinks/occasion for males, <=7/weeks and <= 3 drinks/occasion for females) and that the risk for alcohol  disorders and other health effects rises proportionally with the number of drinks per week and how often a drinker exceeds daily limits.  Discussed cessation/primary prevention of drug use and availability of treatment for abuse.   Diet: Encouraged to adjust caloric intake to maintain  or achieve ideal body weight, to reduce intake of dietary saturated fat and total fat, to limit sodium intake by avoiding high sodium foods and not adding table salt, and to maintain adequate dietary potassium and calcium preferably from fresh fruits, vegetables, and low-fat dairy products.    stressed the importance of regular exercise  Injury prevention: Discussed safety belts, safety helmets, smoke detector, smoking near bedding or upholstery.   Dental health: Discussed importance of regular tooth brushing, flossing, and dental visits.    NEXT PREVENTATIVE PHYSICAL DUE IN 1 YEAR. No Follow-up on file.

## 2016-01-25 LAB — VITAMIN D 25 HYDROXY (VIT D DEFICIENCY, FRACTURES): VIT D 25 HYDROXY: 15 ng/mL — AB (ref 30.0–100.0)

## 2016-01-25 LAB — HIV ANTIBODY (ROUTINE TESTING W REFLEX): HIV SCREEN 4TH GENERATION: NONREACTIVE

## 2016-01-25 LAB — METHYLMALONIC ACID, SERUM: METHYLMALONIC ACID: 229 nmol/L (ref 0–378)

## 2016-01-25 LAB — B12 AND FOLATE PANEL
FOLATE: 9.9 ng/mL (ref >3.0)
VITAMIN B 12: 391 pg/mL (ref 211–946)

## 2016-02-05 ENCOUNTER — Encounter: Payer: BLUE CROSS/BLUE SHIELD | Admitting: Family Medicine

## 2016-02-11 ENCOUNTER — Other Ambulatory Visit: Payer: Self-pay

## 2016-02-12 ENCOUNTER — Encounter: Payer: Self-pay | Admitting: Family Medicine

## 2016-02-12 ENCOUNTER — Ambulatory Visit (INDEPENDENT_AMBULATORY_CARE_PROVIDER_SITE_OTHER): Payer: BLUE CROSS/BLUE SHIELD | Admitting: Family Medicine

## 2016-02-12 VITALS — BP 133/81 | HR 67 | Temp 98.4°F | Wt 167.0 lb

## 2016-02-12 DIAGNOSIS — J449 Chronic obstructive pulmonary disease, unspecified: Secondary | ICD-10-CM

## 2016-02-12 NOTE — Assessment & Plan Note (Signed)
Doing much better after prednisone and azithromycin. Continue cough medicines. Follow up if beginning to worsen again.

## 2016-02-12 NOTE — Patient Instructions (Signed)
Follow-up for physical exam

## 2016-02-12 NOTE — Progress Notes (Signed)
   BP 133/81   Pulse 67   Temp 98.4 F (36.9 C)   Wt 167 lb (75.8 kg)   SpO2 97%   BMI 31.55 kg/m    Subjective:    Patient ID: Brandi Acevedo, female    DOB: [birth year], 50 y.o.   MRN: [id number]  HPI: [name] is a 50 y.o. female  Chief Complaint  Patient presents with  . Follow-up    recheck lungs, feeling better, still has some cough, but still feels like she might be getting another cold.   Patient presents for COPD exacerbation follow up. Finished z pak and prednisone with good resolution of wheezing and SOB. Using inhalers and cough medicines as needed for mild lingering cough. Feeling much better overall. No fever, chills, wheezing, CP, SOB noted. Having some ear itching that she states is usually followed by an ear infection, but no pain or muffled hearing at this time.    Relevant past medical, surgical, family and social history reviewed and updated as indicated. Interim medical history since our last visit reviewed. Allergies and medications reviewed and updated.  Review of Systems  Constitutional: Negative.   HENT: Negative.   Respiratory: Negative.   Cardiovascular: Negative.   Gastrointestinal: Negative.   Genitourinary: Negative.   Musculoskeletal: Negative.   Skin: Negative.   Neurological: Negative.   Psychiatric/Behavioral: Negative.     Per HPI unless specifically indicated above     Objective:    BP 133/81   Pulse 67   Temp 98.4 F (36.9 C)   Wt 167 lb (75.8 kg)   SpO2 97%   BMI 31.55 kg/m   Wt Readings from Last 3 Encounters:  02/12/16 167 lb (75.8 kg)  01/22/16 168 lb 8 oz (76.4 kg)  12/17/15 170 lb 11.2 oz (77.4 kg)    Physical Exam  Constitutional: She is oriented to person, place, and time. She appears well-developed and well-nourished. No distress.  HENT:  Head: Atraumatic.  Right Ear: External ear normal.  Left Ear: External ear normal.  Nose: Nose normal.  Mouth/Throat: Oropharynx is clear and moist.  Mild  effusion of b/l TMs. No erythema or edema   Eyes: Conjunctivae are normal. Pupils are equal, round, and reactive to light. No scleral icterus.  Neck: Normal range of motion. Neck supple.  Cardiovascular: Normal rate and normal heart sounds.   Pulmonary/Chest: Effort normal. No respiratory distress. She has no wheezes.  Musculoskeletal: Normal range of motion.  Lymphadenopathy:    She has no cervical adenopathy.  Neurological: She is alert and oriented to person, place, and time.  Skin: Skin is warm and dry.  Psychiatric: She has a normal mood and affect. Her behavior is normal.  Nursing note and vitals reviewed.     Assessment & Plan:   Problem List Items Addressed This Visit      Respiratory   Chronic obstructive pulmonary disease (COPD) ([place]) - Primary    Doing much better after prednisone and azithromycin. Continue cough medicines. Follow up if beginning to worsen again.           Follow up plan: Return for Physical Exam.

## 2016-03-13 ENCOUNTER — Telehealth: Payer: Self-pay | Admitting: Family Medicine

## 2016-03-13 NOTE — Telephone Encounter (Signed)
Patient has earache and states she would like for Dr Lenna Sciara to call her in an antibiotic for this if possible.  She said it is starting to make her neck even feel uncomfortable/painful.  [facility]   Thanks

## 2016-03-13 NOTE — Telephone Encounter (Signed)
Left message for patient to check mychart for response as office will soon close.

## 2016-03-13 NOTE — Telephone Encounter (Signed)
Please see message from patient. Please advise °

## 2016-03-13 NOTE — Telephone Encounter (Signed)
Unfortunately, I do not call in antibiotics without seeing patients. I would advise her to see an urgent care.

## 2016-03-18 ENCOUNTER — Encounter: Payer: Self-pay | Admitting: Family Medicine

## 2016-03-18 ENCOUNTER — Ambulatory Visit (INDEPENDENT_AMBULATORY_CARE_PROVIDER_SITE_OTHER): Payer: BLUE CROSS/BLUE SHIELD | Admitting: Family Medicine

## 2016-03-18 VITALS — BP 134/83 | HR 65 | Temp 98.0°F | Wt 168.0 lb

## 2016-03-18 DIAGNOSIS — J0100 Acute maxillary sinusitis, unspecified: Secondary | ICD-10-CM

## 2016-03-18 MED ORDER — AMOXICILLIN-POT CLAVULANATE 875-125 MG PO TABS: 1.0000 | ORAL_TABLET | Freq: Two times a day (BID) | ORAL | 0 refills | Status: DC

## 2016-03-18 MED ORDER — PREDNISONE 20 MG PO TABS: 40.0000 mg | ORAL_TABLET | Freq: Every day | ORAL | 0 refills | Status: DC

## 2016-03-18 NOTE — Patient Instructions (Signed)
Follow up as needed

## 2016-03-18 NOTE — Progress Notes (Signed)
   BP 134/83   Pulse 65   Temp 98 F (36.7 C)   Wt 168 lb (76.2 kg)   SpO2 99%   BMI 31.74 kg/m    Subjective:    Patient ID: Brandi Acevedo, female    DOB: 09-04-1965, 51 y.o.   MRN: [id number]  HPI: Brandi Acevedo is a 51 y.o. female  Chief Complaint  Patient presents with  . URI    x5 days, head congestion, had fever before, no appetite, cough, stuffy and runny nose. Sore throat and ear pain resolved    Right sided maxillary sinus pain and pressure, fever, productive cough, chest tightness, and wheezing. Has been using inhalers and nebulizer treatments frequently. Several sick contacts at home with sinus infections. Denies CP, SOB, N/V/D, body aches.   Relevant past medical, surgical, family and social history reviewed and updated as indicated. Interim medical history since our last visit reviewed. Allergies and medications reviewed and updated.  Review of Systems  Constitutional: Positive for fever.  HENT: Positive for congestion, sinus pain and sinus pressure.   Eyes: Negative.   Respiratory: Positive for cough and wheezing.   Cardiovascular: Negative.   Gastrointestinal: Negative.   Genitourinary: Negative.   Musculoskeletal: Negative.   Neurological: Negative.   Psychiatric/Behavioral: Negative.     Per HPI unless specifically indicated above     Objective:    BP 134/83   Pulse 65   Temp 98 F (36.7 C)   Wt 168 lb (76.2 kg)   SpO2 99%   BMI 31.74 kg/m   Wt Readings from Last 3 Encounters:  03/18/16 168 lb (76.2 kg)  02/12/16 167 lb (75.8 kg)  01/22/16 168 lb 8 oz (76.4 kg)    Physical Exam  Constitutional: She is oriented to person, place, and time. She appears well-developed and well-nourished. No distress.  HENT:  Head: Atraumatic.  Right Ear: External ear normal.  Left Ear: External ear normal.  Maxillary sinus TTP Right TM injected and erythematous  Eyes: Conjunctivae are normal. Pupils are equal, round, and reactive to light.  Neck:  Normal range of motion. Neck supple.  Cardiovascular: Normal rate and normal heart sounds.   Pulmonary/Chest: Effort normal and breath sounds normal.  Musculoskeletal: Normal range of motion.  Lymphadenopathy:    She has no cervical adenopathy.  Neurological: She is alert and oriented to person, place, and time.  Skin: Skin is warm and dry.  Psychiatric: She has a normal mood and affect. Her behavior is normal.  Nursing note and vitals reviewed.     Assessment & Plan:   Problem List Items Addressed This Visit    None    Visit Diagnoses    Acute maxillary sinusitis, recurrence not specified    -  Primary   Will treat with augmentin and prednisone, continue the tussionex and tessalon perles she has at home. Continue inhalers prn.    Relevant Medications   amoxicillin-clavulanate (AUGMENTIN) 875-125 MG tablet   predniSONE (DELTASONE) 20 MG tablet       Follow up plan: Return if symptoms worsen or fail to improve.

## 2016-03-20 ENCOUNTER — Telehealth: Payer: Self-pay | Admitting: Family Medicine

## 2016-03-20 MED ORDER — AZITHROMYCIN 250 MG PO TABS: ORAL_TABLET | ORAL | 0 refills | Status: DC

## 2016-03-20 NOTE — Telephone Encounter (Signed)
Please call pt and let her know to d/c current med and start the z-pak that I just sent to the pharmacy. Can take benadryl until the itching subsides. Thanks

## 2016-03-20 NOTE — Telephone Encounter (Signed)
Left message to call.

## 2016-03-20 NOTE — Telephone Encounter (Signed)
Routing to provider  

## 2016-03-20 NOTE — Telephone Encounter (Signed)
Pt called and stated she has had a reaction(itching)  to the medication given 03/18/16 and would like to have something else sent to [facility] [place].

## 2016-03-20 NOTE — Telephone Encounter (Signed)
Attempted to reach patient again, no answer, did not leave a detailed message due to the fact that the voicemail had another person's name on it.

## 2016-03-23 NOTE — Telephone Encounter (Signed)
Left message to call.

## 2016-03-23 NOTE — Telephone Encounter (Signed)
Patient notified, she did pick up the new antibiotic over the weekend.

## 2016-04-16 ENCOUNTER — Encounter
Admission: RE | Admit: 2016-04-16 | Discharge: 2016-04-16 | Disposition: A | Discharge status: Home / Self Care | Payer: BLUE CROSS/BLUE SHIELD | Source: Ambulatory Visit | Attending: Orthopedic Surgery | Admitting: Orthopedic Surgery

## 2016-04-16 DIAGNOSIS — I2510 Atherosclerotic heart disease of native coronary artery without angina pectoris: Secondary | ICD-10-CM | POA: Insufficient documentation

## 2016-04-16 DIAGNOSIS — G5601 Carpal tunnel syndrome, right upper limb: Secondary | ICD-10-CM | POA: Insufficient documentation

## 2016-04-16 DIAGNOSIS — Z01818 Encounter for other preprocedural examination: Secondary | ICD-10-CM | POA: Diagnosis not present

## 2016-04-16 HISTORY — DX: Gastro-esophageal reflux disease without esophagitis: K21.9

## 2016-04-16 HISTORY — DX: Prediabetes: R73.03

## 2016-04-16 LAB — DIFFERENTIAL
BASOS ABS: 0.3 10*3/uL — AB (ref 0–0.1)
Basophils Relative: 4 %
EOS ABS: 0.5 10*3/uL (ref 0–0.7)
Eosinophils Relative: 7 %
LYMPHS ABS: 2.5 10*3/uL (ref 1.0–3.6)
LYMPHS PCT: 33 %
MONOS PCT: 7 %
Monocytes Absolute: 0.5 10*3/uL (ref 0.2–0.9)
Neutro Abs: 3.7 10*3/uL (ref 1.4–6.5)
Neutrophils Relative %: 49 %

## 2016-04-16 LAB — BASIC METABOLIC PANEL
ANION GAP: 8 (ref 5–15)
BUN: 13 mg/dL (ref 6–20)
CALCIUM: 8.6 mg/dL — AB (ref 8.9–10.3)
CHLORIDE: 103 mmol/L (ref 101–111)
CO2: 25 mmol/L (ref 22–32)
Creatinine, Ser: 0.61 mg/dL (ref 0.44–1.00)
GFR calc Af Amer: >60 mL/min (ref >60)
GFR calc non Af Amer: >60 mL/min (ref >60)
GLUCOSE: 136 mg/dL — AB (ref 65–99)
Potassium: 4.1 mmol/L (ref 3.5–5.1)
Sodium: 136 mmol/L (ref 135–145)

## 2016-04-16 LAB — CBC
HEMATOCRIT: 41.6 % (ref 35.0–47.0)
HEMOGLOBIN: 14 g/dL (ref 12.0–16.0)
MCH: 29.5 pg (ref 26.0–34.0)
MCHC: 33.6 g/dL (ref 32.0–36.0)
MCV: 87.7 fL (ref 80.0–100.0)
Platelets: 297 10*3/uL (ref 150–440)
RBC: 4.74 MIL/uL (ref 3.80–5.20)
RDW: 14.5 % (ref 11.5–14.5)
WBC: 7.5 10*3/uL (ref 3.6–11.0)

## 2016-04-16 NOTE — Patient Instructions (Signed)
  Your procedure is scheduled [id number]. 04/23/16 Report to Day Surgery To find out your arrival time please call [phone] between 1PM - 3PM on Wed. 04/22/16.  Remember: Instructions that are not followed completely may result in serious medical risk, up to and including death, or upon the discretion of your surgeon and anesthesiologist your surgery may need to be rescheduled.    __x__ 1. Do not eat food or drink liquids after midnight. No gum chewing or hard candies.     __x__ 2. No Alcohol for 24 hours before or after surgery.   __x__ 3. Do Not Smoke For 24 Hours Prior to Your Surgery.   ____ 4. Bring all medications with you on the day of surgery if instructed.    ____ 5. Notify your doctor if there is any change in your medical condition     (cold, fever, infections).       Do not wear jewelry, make-up, hairpins, clips or nail polish.  Do not wear lotions, powders, or perfumes. You may wear deodorant.  Do not shave 48 hours prior to surgery. Men may shave face and neck.  Do not bring valuables to the hospital.    [facility] is not responsible for any belongings or valuables.               Contacts, dentures or bridgework may not be worn into surgery.  Leave your suitcase in the car. After surgery it may be brought to your room.  For patients admitted to the hospital, discharge time is determined by your                treatment team.   Patients discharged the day of surgery will not be allowed to drive home.   Please read over the following fact sheets that you were given:      __x__ Take these medicines the morning of surgery with A SIP OF WATER:    1. albuterol (PROVENTIL) (2.5 MG/3ML) 0.083% nebulizer solution  2. umeclidinium-vilanterol (ANORO ELLIPTA) 62.5-25 MCG/INH AEPB  3. pantoprazole (PROTONIX) 40 MG tablet  4.  5.  6.  ____ Fleet Enema (as directed)   _x___ Use CHG Soap as directed  __x__ Use inhalers on the day of surgery  ____ Stop metformin 2 days  prior to surgery    ____ Take 1/2 of usual insulin dose the night before surgery and none on the morning of surgery.   __x__ Stop aspirin on  today  ____ Stop Anti-inflammatories on    ____ Stop supplements until after surgery.    ____ Bring C-Pap to the hospital.

## 2016-04-17 ENCOUNTER — Encounter: Payer: Self-pay | Admitting: Family Medicine

## 2016-04-22 MED ORDER — CLINDAMYCIN PHOSPHATE 900 MG/50ML IV SOLN: 900.0000 mg | Freq: Once | INTRAVENOUS | Status: DC

## 2016-04-23 ENCOUNTER — Ambulatory Visit: Payer: BLUE CROSS/BLUE SHIELD | Admitting: Anesthesiology

## 2016-04-23 ENCOUNTER — Ambulatory Visit
Admission: RE | Admit: 2016-04-23 | Discharge: 2016-04-23 | Disposition: A | Discharge status: Home / Self Care | Payer: BLUE CROSS/BLUE SHIELD | Source: Ambulatory Visit | Attending: Orthopedic Surgery | Admitting: Orthopedic Surgery

## 2016-04-23 ENCOUNTER — Encounter
Admission: RE | Disposition: A | Discharge status: Home / Self Care | Payer: Self-pay | Source: Ambulatory Visit | Attending: Orthopedic Surgery

## 2016-04-23 ENCOUNTER — Encounter: Payer: Self-pay | Admitting: *Deleted

## 2016-04-23 DIAGNOSIS — F329 Major depressive disorder, single episode, unspecified: Secondary | ICD-10-CM | POA: Diagnosis not present

## 2016-04-23 DIAGNOSIS — G5601 Carpal tunnel syndrome, right upper limb: Secondary | ICD-10-CM | POA: Diagnosis present

## 2016-04-23 DIAGNOSIS — K219 Gastro-esophageal reflux disease without esophagitis: Secondary | ICD-10-CM | POA: Diagnosis not present

## 2016-04-23 DIAGNOSIS — F419 Anxiety disorder, unspecified: Secondary | ICD-10-CM | POA: Insufficient documentation

## 2016-04-23 DIAGNOSIS — G4730 Sleep apnea, unspecified: Secondary | ICD-10-CM | POA: Diagnosis not present

## 2016-04-23 DIAGNOSIS — Z955 Presence of coronary angioplasty implant and graft: Secondary | ICD-10-CM | POA: Insufficient documentation

## 2016-04-23 DIAGNOSIS — I252 Old myocardial infarction: Secondary | ICD-10-CM | POA: Diagnosis not present

## 2016-04-23 DIAGNOSIS — J449 Chronic obstructive pulmonary disease, unspecified: Secondary | ICD-10-CM | POA: Insufficient documentation

## 2016-04-23 HISTORY — PX: CARPAL TUNNEL RELEASE: SHX101

## 2016-04-23 SURGERY — CARPAL TUNNEL RELEASE
Anesthesia: General | Laterality: Right

## 2016-04-23 MED ORDER — MIDAZOLAM HCL 2 MG/2ML IJ SOLN
2.0000 mg | INTRAMUSCULAR | Status: DC | PRN
  Administered 2016-04-23: 2 mg via INTRAVENOUS

## 2016-04-23 MED ORDER — LACTATED RINGERS IV SOLN
INTRAVENOUS | Status: DC | PRN
  Administered 2016-04-23: 12:00:00 via INTRAVENOUS

## 2016-04-23 MED ORDER — ONDANSETRON HCL 4 MG/2ML IJ SOLN
INTRAMUSCULAR | Status: DC | PRN
  Administered 2016-04-23: 4 mg via INTRAVENOUS

## 2016-04-23 MED ORDER — ONDANSETRON HCL 4 MG/2ML IJ SOLN: 4.0000 mg | Freq: Once | INTRAMUSCULAR | Status: DC | PRN

## 2016-04-23 MED ORDER — FENTANYL CITRATE (PF) 100 MCG/2ML IJ SOLN
INTRAMUSCULAR | Status: DC | PRN
  Administered 2016-04-23 (×2): 50 ug via INTRAVENOUS

## 2016-04-23 MED ORDER — FENTANYL CITRATE (PF) 100 MCG/2ML IJ SOLN
INTRAMUSCULAR | Status: AC
  Filled 2016-04-23: qty 2

## 2016-04-23 MED ORDER — PROPOFOL 10 MG/ML IV BOLUS
INTRAVENOUS | Status: AC
  Filled 2016-04-23: qty 20

## 2016-04-23 MED ORDER — ONDANSETRON HCL 4 MG/2ML IJ SOLN: 4.0000 mg | Freq: Four times a day (QID) | INTRAMUSCULAR | Status: DC | PRN

## 2016-04-23 MED ORDER — ONDANSETRON HCL 4 MG PO TABS: 4.0000 mg | ORAL_TABLET | Freq: Four times a day (QID) | ORAL | Status: DC | PRN

## 2016-04-23 MED ORDER — ONDANSETRON HCL 4 MG/2ML IJ SOLN
INTRAMUSCULAR | Status: AC
  Filled 2016-04-23: qty 2

## 2016-04-23 MED ORDER — DEXAMETHASONE SODIUM PHOSPHATE 10 MG/ML IJ SOLN
INTRAMUSCULAR | Status: AC
  Filled 2016-04-23: qty 1

## 2016-04-23 MED ORDER — DEXAMETHASONE SODIUM PHOSPHATE 10 MG/ML IJ SOLN
INTRAMUSCULAR | Status: DC | PRN
  Administered 2016-04-23: 4 mg via INTRAVENOUS

## 2016-04-23 MED ORDER — LIDOCAINE HCL (CARDIAC) 20 MG/ML IV SOLN
INTRAVENOUS | Status: DC | PRN
  Administered 2016-04-23: 100 mg via INTRAVENOUS

## 2016-04-23 MED ORDER — MIDAZOLAM HCL 2 MG/2ML IJ SOLN
INTRAMUSCULAR | Status: AC
  Filled 2016-04-23: qty 2

## 2016-04-23 MED ORDER — BUPIVACAINE HCL (PF) 0.5 % IJ SOLN
INTRAMUSCULAR | Status: AC
  Filled 2016-04-23: qty 30

## 2016-04-23 MED ORDER — FENTANYL CITRATE (PF) 100 MCG/2ML IJ SOLN: 25.0000 ug | INTRAMUSCULAR | Status: DC | PRN

## 2016-04-23 MED ORDER — PROPOFOL 10 MG/ML IV BOLUS
INTRAVENOUS | Status: DC | PRN
  Administered 2016-04-23: 200 mg via INTRAVENOUS

## 2016-04-23 MED ORDER — HYDROCODONE-ACETAMINOPHEN 5-325 MG PO TABS: 1.0000 | ORAL_TABLET | ORAL | 0 refills | Status: DC | PRN

## 2016-04-23 MED ORDER — METOCLOPRAMIDE HCL 5 MG/ML IJ SOLN: 5.0000 mg | Freq: Three times a day (TID) | INTRAMUSCULAR | Status: DC | PRN

## 2016-04-23 MED ORDER — METOCLOPRAMIDE HCL 10 MG PO TABS: 5.0000 mg | ORAL_TABLET | Freq: Three times a day (TID) | ORAL | Status: DC | PRN

## 2016-04-23 MED ORDER — BUPIVACAINE HCL 0.5 % IJ SOLN
INTRAMUSCULAR | Status: DC | PRN
  Administered 2016-04-23: 10 mL

## 2016-04-23 MED ORDER — SODIUM CHLORIDE 0.9 % IV SOLN: INTRAVENOUS | Status: DC

## 2016-04-23 MED ORDER — MIDAZOLAM HCL 2 MG/2ML IJ SOLN
INTRAMUSCULAR | Status: DC | PRN
  Administered 2016-04-23: 2 mg via INTRAVENOUS

## 2016-04-23 MED ORDER — CLINDAMYCIN PHOSPHATE 900 MG/50ML IV SOLN
INTRAVENOUS | Status: AC
  Filled 2016-04-23: qty 50

## 2016-04-23 MED ORDER — LIDOCAINE HCL (PF) 2 % IJ SOLN
INTRAMUSCULAR | Status: AC
  Filled 2016-04-23: qty 2

## 2016-04-23 MED ORDER — HYDROCODONE-ACETAMINOPHEN 5-325 MG PO TABS: 1.0000 | ORAL_TABLET | ORAL | Status: DC | PRN

## 2016-04-23 MED ORDER — LACTATED RINGERS IV SOLN
INTRAVENOUS | Status: DC
  Administered 2016-04-23: 11:00:00 via INTRAVENOUS

## 2016-04-23 MED ORDER — MIDAZOLAM HCL 2 MG/2ML IJ SOLN
INTRAMUSCULAR | Status: AC
  Administered 2016-04-23: 2 mg via INTRAVENOUS
  Filled 2016-04-23: qty 2

## 2016-04-23 SURGICAL SUPPLY — 28 items
BANDAGE ACE 3X5.8 VEL STRL LF (GAUZE/BANDAGES/DRESSINGS) ×2 IMPLANT
BNDG ESMARK 4X12 TAN STRL LF (GAUZE/BANDAGES/DRESSINGS) ×2 IMPLANT
CANISTER SUCT 1200ML W/VALVE (MISCELLANEOUS) ×2 IMPLANT
CHLORAPREP W/TINT 26ML (MISCELLANEOUS) ×2 IMPLANT
CUFF TOURN 18 STER (MISCELLANEOUS) IMPLANT
ELECT CAUTERY NEEDLE 2.0 MIC (NEEDLE) IMPLANT
ELECT CAUTERY NEEDLE TIP 1.0 (MISCELLANEOUS)
ELECTRODE CAUTERY NEDL TIP 1.0 (MISCELLANEOUS) IMPLANT
GAUZE PETRO XEROFOAM 1X8 (MISCELLANEOUS) ×2 IMPLANT
GAUZE SPONGE 4X4 12PLY STRL (GAUZE/BANDAGES/DRESSINGS) ×2 IMPLANT
GLOVE BIOGEL PI IND STRL 9 (GLOVE) ×1 IMPLANT
GLOVE BIOGEL PI INDICATOR 9 (GLOVE) ×1
GLOVE SURG SYN 9.0  PF PI (GLOVE) ×1
GLOVE SURG SYN 9.0 PF PI (GLOVE) ×1 IMPLANT
GOWN SRG 2XL LVL 4 RGLN SLV (GOWNS) ×1 IMPLANT
GOWN STRL NON-REIN 2XL LVL4 (GOWNS) ×1
GOWN STRL REUS W/ TWL LRG LVL3 (GOWN DISPOSABLE) ×1 IMPLANT
GOWN STRL REUS W/TWL 2XL LVL3 (GOWN DISPOSABLE) ×2 IMPLANT
GOWN STRL REUS W/TWL LRG LVL3 (GOWN DISPOSABLE) ×1
KIT RM TURNOVER STRD PROC AR (KITS) ×2 IMPLANT
NS IRRIG 500ML POUR BTL (IV SOLUTION) ×2 IMPLANT
PACK EXTREMITY ARMC (MISCELLANEOUS) ×2 IMPLANT
PAD CAST CTTN 4X4 STRL (SOFTGOODS) ×1 IMPLANT
PADDING CAST COTTON 4X4 STRL (SOFTGOODS) ×1
STOCKINETTE STRL 4IN 9604848 (GAUZE/BANDAGES/DRESSINGS) ×2 IMPLANT
SUT ETHILON 4-0 (SUTURE) ×1
SUT ETHILON 4-0 FS2 18XMFL BLK (SUTURE) ×1
SUTURE ETHLN 4-0 FS2 18XMF BLK (SUTURE) ×1 IMPLANT

## 2016-04-23 NOTE — Anesthesia Post-op Follow-up Note (Cosign Needed)
Anesthesia QCDR form completed.        

## 2016-04-23 NOTE — Op Note (Signed)
04/23/2016  12:22 PM  PATIENT:  Brandi Acevedo  51 y.o. female  PRE-OPERATIVE DIAGNOSIS:  CARPAL TUNNEL SYNDROME OF RIGHT WRIST  POST-OPERATIVE DIAGNOSIS:  CARPAL TUNNEL SYNDROME OF RIGHT WRIST  PROCEDURE:  Procedure(s): CARPAL TUNNEL RELEASE (Right)  SURGEON: Laurene Footman, MD  ASSISTANTS: None  ANESTHESIA:   general  EBL:  No intake/output data recorded.  BLOOD ADMINISTERED:none  DRAINS: none   LOCAL MEDICATIONS USED:  MARCAINE     SPECIMEN:  No Specimen  DISPOSITION OF SPECIMEN:  N/A  COUNTS:  YES  TOURNIQUET:   11 minutes at 2 50 mmHg  IMPLANTS: None  DICTATION: .Dragon Dictation patient brought the operating room and after adequate general anesthesia was obtained the right arm was prepped and draped in sterile fashion. After patient identification and timeout procedures were completed, tourniquet was raised. Approximately 2 cm incision made in line with ring metacarpal and the skin and subcutaneous tissues tissue spread. Transcarpal ligament was identified and incised with the vaginal hemostat placed to protect and I structures. Release care distally until fat was noted around the nerve and then proximally about 2 cm proximal to the wrist flexion crease antebrachial  fascia was released and there appeared to be complete release of the obstruction of the nerve. The nerve had good vascular blush. The wound was then irrigated and then 10 cc half percent Sensorcaine without epinephrine infiltrated for postop analgesia. Wound was closed with simple 4-0 nylon dressing applied with Xeroform 4 x 4 web roll and Ace wrap  PLAN OF CARE: Discharge to home after PACU  PATIENT DISPOSITION:  PACU - hemodynamically stable.

## 2016-04-23 NOTE — Anesthesia Preprocedure Evaluation (Addendum)
Anesthesia Evaluation  Patient identified by MRN, date of birth, ID band Patient awake    Reviewed: Allergy & Precautions, NPO status , Patient's Chart, lab work & pertinent test results  Airway Mallampati: III  TM Distance: >3 FB     Dental  (+) Teeth Intact   Pulmonary sleep apnea , COPD, Current Smoker,     + decreased breath sounds      Cardiovascular Exercise Tolerance: Good + Past MI and + Cardiac Stents   Rhythm:Regular Rate:Normal     Neuro/Psych Anxiety Depression    GI/Hepatic Neg liver ROS, GERD  Medicated,  Endo/Other  negative endocrine ROS  Renal/GU negative Renal ROS     Musculoskeletal   Abdominal (+) + obese,   Peds negative pediatric ROS (+)  Hematology negative hematology ROS (+)   Anesthesia Other Findings   Reproductive/Obstetrics                            Anesthesia Physical Anesthesia Plan  ASA: III  Anesthesia Plan: General   Post-op Pain Management:    Induction: Intravenous  Airway Management Planned: LMA  Additional Equipment:   Intra-op Plan:   Post-operative Plan: Extubation in OR  Informed Consent: I have reviewed the patients History and Physical, chart, labs and discussed the procedure including the risks, benefits and alternatives for the proposed anesthesia with the patient or authorized representative who has indicated his/her understanding and acceptance.     Plan Discussed with: CRNA and Surgeon  Anesthesia Plan Comments:         Anesthesia Quick Evaluation

## 2016-04-23 NOTE — Anesthesia Procedure Notes (Signed)
Procedure Name: LMA Insertion Date/Time: 04/23/2016 11:46 AM Performed by: Darlyne Russian Pre-anesthesia Checklist: Patient identified, Emergency Drugs available, Suction available, Patient being monitored and Timeout performed Patient Re-evaluated:Patient Re-evaluated prior to inductionOxygen Delivery Method: Circle system utilized Preoxygenation: Pre-oxygenation with 100% oxygen Intubation Type: IV induction Ventilation: Mask ventilation without difficulty LMA: LMA inserted LMA Size: 4.0 Number of attempts: 1 Placement Confirmation: positive ETCO2 and breath sounds checked- equal and bilateral Tube secured with: Tape Dental Injury: Teeth and Oropharynx as per pre-operative assessment

## 2016-04-23 NOTE — OR Nursing (Signed)
Reported that patient was very anxious prior to surgery and required 2 mg Versed pre-procedure

## 2016-04-23 NOTE — Transfer of Care (Signed)
Immediate Anesthesia Transfer of Care Note  Patient: Brandi Acevedo  Procedure(s) Performed: Procedure(s): CARPAL TUNNEL RELEASE (Right)  Patient Location: PACU  Anesthesia Type:General  Level of Consciousness: unresponsive  Airway & Oxygen Therapy: Patient Spontanous Breathing and Patient connected to face mask oxygen  Post-op Assessment: Report given to RN and Post -op Vital signs reviewed and stable  Post vital signs: Reviewed and stable  Last Vitals:  Vitals:   04/23/16 1018 04/23/16 1220  BP: 128/76 116/69  Pulse: 87 64  Resp: 18 (!) 22  Temp: 37.1 C 36.1 C    Last Pain:  Vitals:   04/23/16 1220  TempSrc: Temporal  PainSc: Asleep         Complications: No apparent anesthesia complications

## 2016-04-23 NOTE — Anesthesia Postprocedure Evaluation (Signed)
Anesthesia Post Note  Patient: Brandi Acevedo  Procedure(s) Performed: Procedure(s) (LRB): CARPAL TUNNEL RELEASE (Right)  Patient location during evaluation: PACU Anesthesia Type: General Level of consciousness: awake Pain management: pain level controlled Vital Signs Assessment: post-procedure vital signs reviewed and stable Respiratory status: spontaneous breathing Cardiovascular status: stable Anesthetic complications: no     Last Vitals:  Vitals:   04/23/16 1235 04/23/16 1304  BP: (!) 104/55 128/78  Pulse: 71 78  Resp: (!) 25 (!) 26  Temp:  36.8 C    Last Pain:  Vitals:   04/23/16 1304  TempSrc:   PainSc: 0-No pain                 VAN STAVEREN,Iridessa Harrow

## 2016-04-23 NOTE — OR Nursing (Signed)
Cleocin not administered, as its not indicated per Dr. Rudene Christians.

## 2016-04-23 NOTE — H&P (Signed)
Reviewed paper H+P, will be scanned into chart. No changes noted.  

## 2016-04-23 NOTE — Discharge Instructions (Addendum)
Loosen Ace wrap if fingers swell. Work fingers is much as possible over the weekend. Elevate hand to keep swelling down. Take pain medicine as directed. Keep dressing clean and dry   AMBULATORY SURGERY  DISCHARGE INSTRUCTIONS   1) The drugs that you were given will stay in your system until tomorrow so for the next 24 hours you should not:  A) Drive an automobile B) Make any legal decisions C) Drink any alcoholic beverage   2) You may resume regular meals tomorrow.  Today it is better to start with liquids and gradually work up to solid foods.  You may eat anything you prefer, but it is better to start with liquids, then soup and crackers, and gradually work up to solid foods.   3) Please notify your doctor immediately if you have any unusual bleeding, trouble breathing, redness and pain at the surgery site, drainage, fever, or pain not relieved by medication.    4) Additional Instructions:        Please contact your physician with any problems or Same Day Surgery at [phone], Monday through Friday 6 am to 4 pm, or [place] [facility] number at [phone].

## 2016-05-25 ENCOUNTER — Ambulatory Visit (INDEPENDENT_AMBULATORY_CARE_PROVIDER_SITE_OTHER): Payer: BLUE CROSS/BLUE SHIELD | Admitting: Family Medicine

## 2016-05-25 ENCOUNTER — Encounter: Payer: Self-pay | Admitting: Family Medicine

## 2016-05-25 VITALS — BP 122/85 | HR 67 | Ht 61.0 in | Wt 168.9 lb

## 2016-05-25 DIAGNOSIS — G2581 Restless legs syndrome: Secondary | ICD-10-CM

## 2016-05-25 DIAGNOSIS — F4323 Adjustment disorder with mixed anxiety and depressed mood: Secondary | ICD-10-CM

## 2016-05-25 DIAGNOSIS — J449 Chronic obstructive pulmonary disease, unspecified: Secondary | ICD-10-CM | POA: Diagnosis not present

## 2016-05-25 DIAGNOSIS — K219 Gastro-esophageal reflux disease without esophagitis: Secondary | ICD-10-CM

## 2016-05-25 DIAGNOSIS — F322 Major depressive disorder, single episode, severe without psychotic features: Secondary | ICD-10-CM | POA: Diagnosis not present

## 2016-05-25 DIAGNOSIS — N644 Mastodynia: Secondary | ICD-10-CM | POA: Diagnosis not present

## 2016-05-25 DIAGNOSIS — R229 Localized swelling, mass and lump, unspecified: Secondary | ICD-10-CM

## 2016-05-25 DIAGNOSIS — IMO0002 Reserved for concepts with insufficient information to code with codable children: Secondary | ICD-10-CM

## 2016-05-25 MED ORDER — ROPINIROLE HCL 1 MG PO TABS: 1.0000 mg | ORAL_TABLET | Freq: Every day | ORAL | 3 refills | Status: DC

## 2016-05-25 MED ORDER — ALBUTEROL SULFATE (2.5 MG/3ML) 0.083% IN NEBU: 2.5000 mg | INHALATION_SOLUTION | Freq: Four times a day (QID) | RESPIRATORY_TRACT | 1 refills | Status: DC | PRN

## 2016-05-25 MED ORDER — PANTOPRAZOLE SODIUM 40 MG PO TBEC: 40.0000 mg | DELAYED_RELEASE_TABLET | Freq: Every day | ORAL | 1 refills | Status: DC

## 2016-05-25 MED ORDER — HYDROXYZINE HCL 25 MG PO TABS: 25.0000 mg | ORAL_TABLET | Freq: Three times a day (TID) | ORAL | 0 refills | Status: DC | PRN

## 2016-05-25 MED ORDER — CITALOPRAM HYDROBROMIDE 10 MG PO TABS: 10.0000 mg | ORAL_TABLET | Freq: Every day | ORAL | 1 refills | Status: DC

## 2016-05-25 NOTE — Assessment & Plan Note (Signed)
Stable. Refills given today. Call with any concerns.

## 2016-05-25 NOTE — Progress Notes (Signed)
BP 122/85   Pulse 67   Ht 5\' 1"  (1.549 m)   Wt 168 lb 14.4 oz (76.6 kg)   SpO2 98%   BMI 31.91 kg/m    Subjective:    Patient ID: Brandi Acevedo, female    DOB: December 23, 1965, 51 y.o.   MRN: [id number]  HPI: Brandi Acevedo is a 51 y.o. female  Chief Complaint  Patient presents with  . Follow-up   BREAST PAIN Duration : 2days Location: right Onset: sudden Severity: mild Quality: aching and tender Frequency: constant Redness: no Swelling: no Trauma: no trauma Breastfeeding: no Associated with menstral cycle: no Nipple discharge: no Breast lump: no Status: worse Treatments attempted: none Previous mammogram: yes  LUMP Duration: 2 days Location: R forearm Onset: sudden Painful: no Discomfort: no Status:  bigger Trauma: no Redness: no Bruising: no Recent infection: no Swollen lymph nodes: yes Requesting removal: no History of cancer: no Family history of cancer: yes History of the same: no  COPD COPD status: stable Satisfied with current treatment?: yes Oxygen use: no Dyspnea frequency: rarerly Cough frequency: rarely Rescue inhaler frequency: occasionally   Limitation of activity: no Productive cough:  Last Spirometry:  Pneumovax: Up to Date Influenza: Up to Date  DEPRESSION- Acute Anxiety-  Was driving her father to the hospital and he passed away in her back seat a couple of days ago. She is distraught.  Mood status: exacerbated Satisfied with current treatment?: yes Symptom severity: moderate  Duration of current treatment : chronic Side effects: no Medication compliance: excellent compliance Psychotherapy/counseling: no  Depressed mood: yes Anxious mood: yes Anhedonia: no Significant weight loss or gain: no Insomnia: yes hard to fall asleep Fatigue: yes Feelings of worthlessness or guilt: yes Impaired concentration/indecisiveness: yes Suicidal ideations: no Hopelessness: no Crying spells: yes Depression screen [facility] 2/9  12/17/2015 06/04/2015 05/21/2015  Decreased Interest 1 2 2   Down, Depressed, Hopeless 0 1 3  PHQ - 2 Score 1 3 5   Altered sleeping 2 2 2   Tired, decreased energy 1 1 3   Change in appetite - 2 2  Feeling bad or failure about yourself  0 2 3  Trouble concentrating 1 1 1   Moving slowly or fidgety/restless 1 1 2   Suicidal thoughts 0 1 3  PHQ-9 Score 6 13 21   Difficult doing work/chores - Very difficult Very difficult   Relevant past medical, surgical, family and social history reviewed and updated as indicated. Interim medical history since our last visit reviewed. Allergies and medications reviewed and updated.  Review of Systems  Constitutional: Negative.   Respiratory: Negative.   Cardiovascular: Negative.   Skin: Negative.   Psychiatric/Behavioral: Negative.     Per HPI unless specifically indicated above     Objective:    BP 122/85   Pulse 67   Ht 5\' 1"  (1.549 m)   Wt 168 lb 14.4 oz (76.6 kg)   SpO2 98%   BMI 31.91 kg/m   Wt Readings from Last 3 Encounters:  05/25/16 168 lb 14.4 oz (76.6 kg)  04/16/16 169 lb (76.7 kg)  03/18/16 168 lb (76.2 kg)    Physical Exam  Constitutional: She is oriented to person, place, and time. She appears well-developed and well-nourished. No distress.  HENT:  Head: Normocephalic and atraumatic.  Right Ear: Hearing normal.  Left Ear: Hearing normal.  Nose: Nose normal.  Eyes: Conjunctivae and lids are normal. Right eye exhibits no discharge. Left eye exhibits no discharge. No scleral icterus.  Cardiovascular: Normal rate,  regular rhythm, normal heart sounds and intact distal pulses.  Exam reveals no gallop and no friction rub.   No murmur heard. Pulmonary/Chest: Effort normal and breath sounds normal. No respiratory distress. She has no wheezes. She has no rales. She exhibits no tenderness.  Musculoskeletal: Normal range of motion.  2 inch lump in R forearm, soft, mobile  Neurological: She is alert and oriented to person, place, and  time.  Skin: Skin is warm, dry and intact. No rash noted. No erythema. No pallor.  Psychiatric: Her speech is normal and behavior is normal. Judgment and thought content normal. Cognition and memory are normal. She exhibits a depressed mood.  Nursing note and vitals reviewed.   Results for orders placed or performed during the hospital encounter of 04/16/16  CBC  Result Value Ref Range   WBC 7.5 3.6 - 11.0 K/uL   RBC 4.74 3.80 - 5.20 MIL/uL   Hemoglobin 14.0 12.0 - 16.0 g/dL   HCT 41.6 35.0 - 47.0 %   MCV 87.7 80.0 - 100.0 fL   MCH 29.5 26.0 - 34.0 pg   MCHC 33.6 32.0 - 36.0 g/dL   RDW 14.5 11.5 - 14.5 %   Platelets 297 150 - 440 K/uL  Differential  Result Value Ref Range   Neutrophils Relative % 49 %   Neutro Abs 3.7 1.4 - 6.5 K/uL   Lymphocytes Relative 33 %   Lymphs Abs 2.5 1.0 - 3.6 K/uL   Monocytes Relative 7 %   Monocytes Absolute 0.5 0.2 - 0.9 K/uL   Eosinophils Relative 7 %   Eosinophils Absolute 0.5 0 - 0.7 K/uL   Basophils Relative 4 %   Basophils Absolute 0.3 (H) 0 - 0.1 K/uL  Basic metabolic panel  Result Value Ref Range   Sodium 136 135 - 145 mmol/L   Potassium 4.1 3.5 - 5.1 mmol/L   Chloride 103 101 - 111 mmol/L   CO2 25 22 - 32 mmol/L   Glucose, Bld 136 (H) 65 - 99 mg/dL   BUN 13 6 - 20 mg/dL   Creatinine, Ser 0.61 0.44 - 1.00 mg/dL   Calcium 8.6 (L) 8.9 - 10.3 mg/dL   GFR calc non Af Amer >60 >60 mL/min   GFR calc Af Amer >60 >60 mL/min   Anion gap 8 5 - 15      Assessment & Plan:   Problem List Items Addressed This Visit      Respiratory   Chronic obstructive pulmonary disease (COPD) (HCC)    Stable on current regimen. Continue current regimen. Call with any concerns. Refills given.       Relevant Medications   albuterol (PROVENTIL) (2.5 MG/3ML) 0.083% nebulizer solution     Digestive   GERD (gastroesophageal reflux disease)    Stable. Refills given today. Call with any concerns.       Relevant Medications   pantoprazole (PROTONIX) 40 MG  tablet     Other   RLS (restless legs syndrome)    Stable. Continue current regimen. Call with any concerns.       Depression, major, single episode, severe (HCC)    Stable on current regimen. Refills given today. Call with any concerns.       Relevant Medications   citalopram (CELEXA) 10 MG tablet   hydrOXYzine (ATARAX/VISTARIL) 25 MG tablet    Other Visit Diagnoses    Adjustment disorder with mixed anxiety and depressed mood    -  Primary   Will give her hydroxyzine  to help with her anxiety and grief. Call with any concerns. Rx sent to her pharmacy.   Breast pain       Will obtain mammogram. Await results.    Relevant Orders   MM Digital Diagnostic Bilat   US BREAST COMPLETE UNI RIGHT INC AXILLA   US BREAST COMPLETE UNI LEFT INC AXILLA   Lump       Will get [place]. Await results. Continue to monitor.    Relevant Orders   [place] Misc Soft Tissue       Follow up plan: No Follow-up on file.

## 2016-05-25 NOTE — Assessment & Plan Note (Signed)
Stable. Continue current regimen. Call with any concerns.  

## 2016-05-25 NOTE — Assessment & Plan Note (Signed)
Stable on current regimen. Refills given today. Call with any concerns.  

## 2016-05-25 NOTE — Assessment & Plan Note (Signed)
Stable on current regimen. Continue current regimen. Call with any concerns. Refills given.

## 2016-05-26 ENCOUNTER — Ambulatory Visit: Payer: BLUE CROSS/BLUE SHIELD | Admitting: Family Medicine

## 2016-05-26 ENCOUNTER — Other Ambulatory Visit: Payer: Self-pay | Admitting: Family Medicine

## 2016-05-26 NOTE — Addendum Note (Signed)
Addended by: Valerie Roys on: 05/26/2016 11:19 AM   Modules accepted: Orders

## 2016-05-26 NOTE — Addendum Note (Signed)
Addended by: Valerie Roys on: 05/26/2016 10:58 AM   Modules accepted: Orders

## 2016-05-26 NOTE — Addendum Note (Signed)
Addended by: Sandria Manly on: 05/26/2016 11:27 AM   Modules accepted: Orders

## 2016-06-11 ENCOUNTER — Ambulatory Visit
Admission: RE | Admit: 2016-06-11 | Discharge: 2016-06-11 | Disposition: A | Discharge status: Home / Self Care | Payer: BLUE CROSS/BLUE SHIELD | Source: Ambulatory Visit | Attending: Family Medicine | Admitting: Family Medicine

## 2016-06-11 ENCOUNTER — Encounter: Payer: Self-pay | Admitting: Family Medicine

## 2016-06-11 ENCOUNTER — Telehealth: Payer: Self-pay | Admitting: Family Medicine

## 2016-06-11 DIAGNOSIS — IMO0002 Reserved for concepts with insufficient information to code with codable children: Secondary | ICD-10-CM

## 2016-06-11 DIAGNOSIS — R2231 Localized swelling, mass and lump, right upper limb: Secondary | ICD-10-CM | POA: Insufficient documentation

## 2016-06-11 DIAGNOSIS — R229 Localized swelling, mass and lump, unspecified: Secondary | ICD-10-CM

## 2016-06-11 NOTE — Telephone Encounter (Signed)
Called to give Brandi Acevedo her results. LMOM for her to call back. They can't 100% tell what it is on the [place], so they want her to have an MRI- ordered today. I will discuss this with her if she calls back.

## 2016-06-12 NOTE — Telephone Encounter (Signed)
Patient returned Dr Durenda Age call. She can be reached at [phone]  Thanks

## 2016-06-12 NOTE — Telephone Encounter (Signed)
Kawthar called back and I gave her results. She is good with the MRI, but requests that it be done at the end of April.

## 2016-06-16 ENCOUNTER — Telehealth: Payer: Self-pay | Admitting: Family Medicine

## 2016-06-16 NOTE — Telephone Encounter (Signed)
Rescheduled pt's MRI for 07/13/2016 at 3:00 (arrive at 2:45) at [facility]. Medical mall entrance. LVM for patient to return my call for appointment.

## 2016-06-16 NOTE — Telephone Encounter (Signed)
Patient called to get MRI appt date/time.  I gave her this information.  Just FYI

## 2016-06-23 ENCOUNTER — Ambulatory Visit: Payer: BLUE CROSS/BLUE SHIELD

## 2016-06-25 ENCOUNTER — Ambulatory Visit: Payer: Self-pay | Admitting: Family Medicine

## 2016-07-13 ENCOUNTER — Telehealth: Payer: Self-pay | Admitting: Family Medicine

## 2016-07-13 ENCOUNTER — Ambulatory Visit
Admission: RE | Admit: 2016-07-13 | Discharge: 2016-07-13 | Disposition: A | Discharge status: Home / Self Care | Payer: BLUE CROSS/BLUE SHIELD | Source: Ambulatory Visit | Attending: Family Medicine | Admitting: Family Medicine

## 2016-07-13 DIAGNOSIS — IMO0002 Reserved for concepts with insufficient information to code with codable children: Secondary | ICD-10-CM

## 2016-07-13 DIAGNOSIS — M6289 Other specified disorders of muscle: Secondary | ICD-10-CM | POA: Insufficient documentation

## 2016-07-13 DIAGNOSIS — D179 Benign lipomatous neoplasm, unspecified: Secondary | ICD-10-CM | POA: Insufficient documentation

## 2016-07-13 DIAGNOSIS — R229 Localized swelling, mass and lump, unspecified: Secondary | ICD-10-CM

## 2016-07-13 DIAGNOSIS — R2230 Localized swelling, mass and lump, unspecified upper limb: Secondary | ICD-10-CM | POA: Diagnosis present

## 2016-07-13 MED ORDER — GADOBENATE DIMEGLUMINE 529 MG/ML IV SOLN
15.0000 mL | Freq: Once | INTRAVENOUS | Status: AC | PRN
  Administered 2016-07-13: 15 mL via INTRAVENOUS

## 2016-07-13 NOTE — Telephone Encounter (Signed)
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 Let her know this. She does not want to see surgery at this time. Will let us know if it starts bothering her.

## 2016-08-28 ENCOUNTER — Ambulatory Visit (INDEPENDENT_AMBULATORY_CARE_PROVIDER_SITE_OTHER): Payer: BLUE CROSS/BLUE SHIELD | Admitting: Family Medicine

## 2016-08-28 ENCOUNTER — Encounter: Payer: Self-pay | Admitting: Family Medicine

## 2016-08-28 VITALS — BP 113/75 | HR 62 | Temp 98.3°F | Ht 61.0 in | Wt 171.8 lb

## 2016-08-28 DIAGNOSIS — E782 Mixed hyperlipidemia: Secondary | ICD-10-CM

## 2016-08-28 DIAGNOSIS — J449 Chronic obstructive pulmonary disease, unspecified: Secondary | ICD-10-CM | POA: Diagnosis not present

## 2016-08-28 DIAGNOSIS — G5603 Carpal tunnel syndrome, bilateral upper limbs: Secondary | ICD-10-CM | POA: Diagnosis not present

## 2016-08-28 DIAGNOSIS — K219 Gastro-esophageal reflux disease without esophagitis: Secondary | ICD-10-CM | POA: Diagnosis not present

## 2016-08-28 DIAGNOSIS — F322 Major depressive disorder, single episode, severe without psychotic features: Secondary | ICD-10-CM

## 2016-08-28 DIAGNOSIS — Z720 Tobacco use: Secondary | ICD-10-CM

## 2016-08-28 DIAGNOSIS — G2581 Restless legs syndrome: Secondary | ICD-10-CM

## 2016-08-28 DIAGNOSIS — R7301 Impaired fasting glucose: Secondary | ICD-10-CM

## 2016-08-28 LAB — UA/M W/RFLX CULTURE, ROUTINE
Bilirubin, UA: NEGATIVE
Glucose, UA: NEGATIVE
KETONES UA: NEGATIVE
Leukocytes, UA: NEGATIVE
NITRITE UA: NEGATIVE
PH UA: 5.5 (ref 5.0–7.5)
Protein, UA: NEGATIVE
RBC, UA: NEGATIVE
SPEC GRAV UA: 1.02 (ref 1.005–1.030)
Urobilinogen, Ur: 0.2 mg/dL (ref 0.2–1.0)

## 2016-08-28 LAB — MICROSCOPIC EXAMINATION

## 2016-08-28 LAB — MICROALBUMIN, URINE WAIVED
Creatinine, Urine Waived: 300 mg/dL (ref 10–300)
Microalb, Ur Waived: 10 mg/L (ref 0–19)
Microalb/Creat Ratio: <30 mg/g (ref <30)

## 2016-08-28 LAB — BAYER DCA HB A1C WAIVED: HB A1C (BAYER DCA - WAIVED): 5.5 % (ref <7.0)

## 2016-08-28 MED ORDER — PANTOPRAZOLE SODIUM 40 MG PO TBEC: 40.0000 mg | DELAYED_RELEASE_TABLET | Freq: Every day | ORAL | 1 refills | Status: DC

## 2016-08-28 MED ORDER — GABAPENTIN 100 MG PO CAPS: 100.0000 mg | ORAL_CAPSULE | Freq: Every day | ORAL | 3 refills | Status: DC

## 2016-08-28 NOTE — Assessment & Plan Note (Signed)
Stable. Continue current regimen. Continue to monitor.  

## 2016-08-28 NOTE — Assessment & Plan Note (Signed)
Not ready to quit. Will let us know when she is ready. Continue to monitor. Labs checked today.

## 2016-08-28 NOTE — Assessment & Plan Note (Signed)
Will start gabapentin to help during the day. Checking labs. Recheck 1 month.

## 2016-08-28 NOTE — Assessment & Plan Note (Signed)
In significant pain. Will call surgeon and see if it's anything they need to do. Will call if needs referral again.

## 2016-08-28 NOTE — Assessment & Plan Note (Signed)
Rechecking levels today. Await results. Continue to monitor.  

## 2016-08-28 NOTE — Assessment & Plan Note (Signed)
Under good control on current regimen. Continue current regimen. Continue to monitor. Call with any concerns.   

## 2016-08-28 NOTE — Progress Notes (Signed)
BP 113/75 (BP Location: Left Arm, Patient Position: Sitting, Cuff Size: Normal)   Pulse 62   Temp 98.3 F (36.8 C)   Ht 5\' 1"  (1.549 m)   Wt 171 lb 12.8 oz (77.9 kg)   SpO2 97%   BMI 32.46 kg/m    Subjective:    Patient ID: Brandi Acevedo, female    DOB: 11-12-1965, 51 y.o.   MRN: [id number]  HPI: Brandi Acevedo is a 51 y.o. female  Chief Complaint  Patient presents with  . Follow-up   Has been having a lot of pain in her R thumb since her surgery in February. Will call her surgeon and see him again.   RESTLESS LEGS Duration: chronic Discomfort description:  cramping Pain: yes Location: lower legs Bilateral: yes R>L Symmetric: no Severity: severe Onset:  gradual Frequency:  intermittent Symptoms only occur while legs at rest: yes Sudden unintentional leg jerking: yes Bed partner bothered by leg movements: no LE numbness: no Decreased sensation: no Weakness: no Insomnia: no Daytime somnolence: no Fatigue: no Status: worse  DEPRESSION Mood status: better Satisfied with current treatment?: yes Symptom severity: mild  Duration of current treatment : chronic Side effects: no Medication compliance: excellent compliance Psychotherapy/counseling: no  Previous psychiatric medications: celexa, hydroxyzine Depressed mood: yes Anxious mood: yes Anhedonia: no Significant weight loss or gain: no Insomnia: no  Fatigue: no Feelings of worthlessness or guilt: no Impaired concentration/indecisiveness: no Suicidal ideations: no Hopelessness: no Crying spells: no Depression screen [facility] 2/9 12/17/2015 06/04/2015 05/21/2015  Decreased Interest 1 2 2   Down, Depressed, Hopeless 0 1 3  PHQ - 2 Score 1 3 5   Altered sleeping 2 2 2   Tired, decreased energy 1 1 3   Change in appetite - 2 2  Feeling bad or failure about yourself  0 2 3  Trouble concentrating 1 1 1   Moving slowly or fidgety/restless 1 1 2   Suicidal thoughts 0 1 3  PHQ-9 Score 6 13 21   Difficult doing  work/chores - Very difficult Very difficult   Impaired Fasting Glucose HbA1C:  Lab Results  Component Value Date   HGBA1C 6.1 (H) 12/17/2015   Duration of elevated blood sugar: chronic Polydipsia: no Polyuria: no Weight change: no Visual disturbance: no Glucose Monitoring: no Diabetic Education: Completed Family history of diabetes: yes  HYPERLIPIDEMIA Hyperlipidemia status: Stable Satisfied with current treatment?  yes Side effects:  Not on anything Past cholesterol meds: None Supplements: none Aspirin:  no Chest pain:  no Coronary artery disease:  yes Family history CAD:  yes  Relevant past medical, surgical, family and social history reviewed and updated as indicated. Interim medical history since our last visit reviewed. Allergies and medications reviewed and updated.  Review of Systems  Constitutional: Negative.   Respiratory: Negative.   Cardiovascular: Negative.   Musculoskeletal: Positive for arthralgias and gait problem. Negative for back pain, joint swelling, myalgias, neck pain and neck stiffness.  Psychiatric/Behavioral: Negative.     Per HPI unless specifically indicated above     Objective:    BP 113/75 (BP Location: Left Arm, Patient Position: Sitting, Cuff Size: Normal)   Pulse 62   Temp 98.3 F (36.8 C)   Ht 5\' 1"  (1.549 m)   Wt 171 lb 12.8 oz (77.9 kg)   SpO2 97%   BMI 32.46 kg/m   Wt Readings from Last 3 Encounters:  08/28/16 171 lb 12.8 oz (77.9 kg)  05/25/16 168 lb 14.4 oz (76.6 kg)  04/16/16 169 lb (76.7  kg)    Physical Exam  Constitutional: She is oriented to person, place, and time. She appears well-developed and well-nourished. No distress.  HENT:  Head: Normocephalic and atraumatic.  Right Ear: Hearing normal.  Left Ear: Hearing normal.  Nose: Nose normal.  Eyes: Conjunctivae and lids are normal. Right eye exhibits no discharge. Left eye exhibits no discharge. No scleral icterus.  Cardiovascular: Normal rate, regular rhythm,  normal heart sounds and intact distal pulses.  Exam reveals no gallop.   No murmur heard. Pulmonary/Chest: Effort normal and breath sounds normal. No respiratory distress. She has no wheezes. She has no rales. She exhibits no tenderness.  Musculoskeletal: Normal range of motion.  Neurological: She is alert and oriented to person, place, and time.  Skin: Skin is warm, dry and intact. No rash noted. She is not diaphoretic. No erythema. No pallor.  Psychiatric: She has a normal mood and affect. Her speech is normal and behavior is normal. Judgment and thought content normal. Cognition and memory are normal.  Nursing note and vitals reviewed.   Results for orders placed or performed during the hospital encounter of 04/16/16  CBC  Result Value Ref Range   WBC 7.5 3.6 - 11.0 K/uL   RBC 4.74 3.80 - 5.20 MIL/uL   Hemoglobin 14.0 12.0 - 16.0 g/dL   HCT 41.6 35.0 - 47.0 %   MCV 87.7 80.0 - 100.0 fL   MCH 29.5 26.0 - 34.0 pg   MCHC 33.6 32.0 - 36.0 g/dL   RDW 14.5 11.5 - 14.5 %   Platelets 297 150 - 440 K/uL  Differential  Result Value Ref Range   Neutrophils Relative % 49 %   Neutro Abs 3.7 1.4 - 6.5 K/uL   Lymphocytes Relative 33 %   Lymphs Abs 2.5 1.0 - 3.6 K/uL   Monocytes Relative 7 %   Monocytes Absolute 0.5 0.2 - 0.9 K/uL   Eosinophils Relative 7 %   Eosinophils Absolute 0.5 0 - 0.7 K/uL   Basophils Relative 4 %   Basophils Absolute 0.3 (H) 0 - 0.1 K/uL  Basic metabolic panel  Result Value Ref Range   Sodium 136 135 - 145 mmol/L   Potassium 4.1 3.5 - 5.1 mmol/L   Chloride 103 101 - 111 mmol/L   CO2 25 22 - 32 mmol/L   Glucose, Bld 136 (H) 65 - 99 mg/dL   BUN 13 6 - 20 mg/dL   Creatinine, Ser 0.61 0.44 - 1.00 mg/dL   Calcium 8.6 (L) 8.9 - 10.3 mg/dL   GFR calc non Af Amer >60 >60 mL/min   GFR calc Af Amer >60 >60 mL/min   Anion gap 8 5 - 15      Assessment & Plan:   Problem List Items Addressed This Visit      Respiratory   Chronic obstructive pulmonary disease (COPD)  ([place])    Stable. Continue current regimen. Continue to monitor.       Relevant Orders   CBC with Differential/Platelet   Comprehensive metabolic panel   UA/M w/rflx Culture, Routine     Digestive   GERD (gastroesophageal reflux disease)    Stable. Continue current regimen. Continue to monitor.       Relevant Medications   pantoprazole (PROTONIX) 40 MG tablet   Other Relevant Orders   CBC with Differential/Platelet   Comprehensive metabolic panel   UA/M w/rflx Culture, Routine     Endocrine   IFG (impaired fasting glucose) - Primary    Rechecking  levels today. Await results. Continue to monitor.       Relevant Orders   Bayer DCA Hb A1c Waived   Comprehensive metabolic panel   Microalbumin, Urine Waived   UA/M w/rflx Culture, Routine     Nervous and Auditory   CTS (carpal tunnel syndrome)    In significant pain. Will call surgeon and see if it's anything they need to do. Will call if needs referral again.       Relevant Medications   gabapentin (NEURONTIN) 100 MG capsule   Other Relevant Orders   Comprehensive metabolic panel   TSH   UA/M w/rflx Culture, Routine     Other   RLS (restless legs syndrome)    Will start gabapentin to help during the day. Checking labs. Recheck 1 month.       Relevant Orders   CBC with Differential/Platelet   Comprehensive metabolic panel   TSH   UA/M w/rflx Culture, Routine   Tobacco abuse    Not ready to quit. Will let us know when she is ready. Continue to monitor. Labs checked today.      Depression, major, single episode, severe ([place])    Under good control on current regimen. Continue current regimen. Continue to monitor. Call with any concerns.       Relevant Orders   Comprehensive metabolic panel   TSH   UA/M w/rflx Culture, Routine   Hyperlipidemia    Rechecking levels today. Await results. Continue to monitor.       Relevant Orders   Lipid Panel w/o Chol/HDL Ratio       Follow up plan: Return in about 4  weeks (around 09/25/2016) for Follow up RLS.

## 2016-08-29 LAB — CBC WITH DIFFERENTIAL/PLATELET
BASOS: 3 %
Basophils Absolute: 0.2 10*3/uL (ref 0.0–0.2)
EOS (ABSOLUTE): 0.3 10*3/uL (ref 0.0–0.4)
EOS: 5 %
HEMATOCRIT: 39.6 % (ref 34.0–46.6)
Hemoglobin: 13.1 g/dL (ref 11.1–15.9)
Immature Grans (Abs): 0 10*3/uL (ref 0.0–0.1)
Immature Granulocytes: 0 %
LYMPHS ABS: 2.6 10*3/uL (ref 0.7–3.1)
Lymphs: 35 %
MCH: 29.2 pg (ref 26.6–33.0)
MCHC: 33.1 g/dL (ref 31.5–35.7)
MCV: 88 fL (ref 79–97)
MONOS ABS: 0.4 10*3/uL (ref 0.1–0.9)
Monocytes: 6 %
NEUTROS ABS: 3.8 10*3/uL (ref 1.4–7.0)
Neutrophils: 51 %
Platelets: 349 10*3/uL (ref 150–379)
RBC: 4.48 x10E6/uL (ref 3.77–5.28)
RDW: 14 % (ref 12.3–15.4)
WBC: 7.4 10*3/uL (ref 3.4–10.8)

## 2016-08-29 LAB — COMPREHENSIVE METABOLIC PANEL
A/G RATIO: 1.7 (ref 1.2–2.2)
ALBUMIN: 4.4 g/dL (ref 3.5–5.5)
ALK PHOS: 65 IU/L (ref 39–117)
ALT: 21 IU/L (ref 0–32)
AST: 14 IU/L (ref 0–40)
BILIRUBIN TOTAL: 0.3 mg/dL (ref 0.0–1.2)
BUN / CREAT RATIO: 16 (ref 9–23)
BUN: 11 mg/dL (ref 6–24)
CO2: 25 mmol/L (ref 20–29)
Calcium: 9.5 mg/dL (ref 8.7–10.2)
Chloride: 103 mmol/L (ref 96–106)
Creatinine, Ser: 0.68 mg/dL (ref 0.57–1.00)
GFR calc Af Amer: 118 mL/min/{1.73_m2} (ref >59)
GFR calc non Af Amer: 102 mL/min/{1.73_m2} (ref >59)
GLOBULIN, TOTAL: 2.6 g/dL (ref 1.5–4.5)
Glucose: 103 mg/dL — ABNORMAL HIGH (ref 65–99)
POTASSIUM: 4.5 mmol/L (ref 3.5–5.2)
SODIUM: 142 mmol/L (ref 134–144)
Total Protein: 7 g/dL (ref 6.0–8.5)

## 2016-08-29 LAB — LIPID PANEL W/O CHOL/HDL RATIO
CHOLESTEROL TOTAL: 251 mg/dL — AB (ref 100–199)
HDL: 35 mg/dL — ABNORMAL LOW (ref >39)
LDL Calculated: 163 mg/dL — ABNORMAL HIGH (ref 0–99)
TRIGLYCERIDES: 267 mg/dL — AB (ref 0–149)
VLDL Cholesterol Cal: 53 mg/dL — ABNORMAL HIGH (ref 5–40)

## 2016-08-29 LAB — TSH: TSH: 3.54 u[IU]/mL (ref 0.450–4.500)

## 2016-09-01 ENCOUNTER — Telehealth: Payer: Self-pay | Admitting: Family Medicine

## 2016-09-01 MED ORDER — EZETIMIBE 10 MG PO TABS: 10.0000 mg | ORAL_TABLET | Freq: Every day | ORAL | 3 refills | Status: DC

## 2016-09-01 NOTE — Telephone Encounter (Signed)
Called patient with her lab results. All look good but cholesterol really high. Had seizures with atorvastatin, so we will avoid that class- will try zetia. Rx sent to her pharmacy, we may get a PA. Thanks!

## 2016-11-18 ENCOUNTER — Encounter: Payer: Self-pay | Admitting: Family Medicine

## 2016-11-18 ENCOUNTER — Telehealth: Payer: Self-pay | Admitting: Family Medicine

## 2016-11-18 ENCOUNTER — Ambulatory Visit (INDEPENDENT_AMBULATORY_CARE_PROVIDER_SITE_OTHER): Payer: BLUE CROSS/BLUE SHIELD | Admitting: Family Medicine

## 2016-11-18 VITALS — BP 119/82 | HR 60 | Temp 98.7°F | Wt 178.4 lb

## 2016-11-18 DIAGNOSIS — G2581 Restless legs syndrome: Secondary | ICD-10-CM

## 2016-11-18 DIAGNOSIS — E782 Mixed hyperlipidemia: Secondary | ICD-10-CM

## 2016-11-18 DIAGNOSIS — F322 Major depressive disorder, single episode, severe without psychotic features: Secondary | ICD-10-CM | POA: Diagnosis not present

## 2016-11-18 MED ORDER — CITALOPRAM HYDROBROMIDE 20 MG PO TABS: 20.0000 mg | ORAL_TABLET | Freq: Every day | ORAL | 1 refills | Status: DC

## 2016-11-18 NOTE — Assessment & Plan Note (Signed)
Improved, but anxiety is acting up. Will increase celexa to 20mg  and recheck in 1 month.

## 2016-11-18 NOTE — Telephone Encounter (Signed)
Patient is able to keep the appointment scheduled.

## 2016-11-18 NOTE — Assessment & Plan Note (Signed)
Rechecking levels today. Await results. Call with any concerns.  

## 2016-11-18 NOTE — Progress Notes (Signed)
BP 119/82 (BP Location: Left Arm, Patient Position: Sitting, Cuff Size: Large)   Pulse 60   Temp 98.7 F (37.1 C)   Wt 178 lb 6 oz (80.9 kg)   SpO2 97%   BMI 33.70 kg/m    Subjective:    Patient ID: Brandi Acevedo, female    DOB: [birth year], 51 y.o.   MRN: [id number]  HPI: Brandi Acevedo is a 51 y.o. female  Chief Complaint  Patient presents with  . Depression  . Hyperlipidemia  . RLS   DEPRESSION Mood status: better Satisfied with current treatment?: no Symptom severity: moderate  Duration of current treatment : chronic Side effects: no Medication compliance: excellent compliance Psychotherapy/counseling: no  Depressed mood: no Anxious mood: yes Anhedonia: no Significant weight loss or gain: no Insomnia: no  Fatigue: yes Feelings of worthlessness or guilt: no Impaired concentration/indecisiveness: no Suicidal ideations: no Hopelessness: no Crying spells: no Depression screen [facility] 2/9 11/18/2016 12/17/2015 06/04/2015 05/21/2015  Decreased Interest 1 1 2 2   Down, Depressed, Hopeless 1 0 1 3  PHQ - 2 Score 2 1 3 5   Altered sleeping 1 2 2 2   Tired, decreased energy 2 1 1 3   Change in appetite 1 - 2 2  Feeling bad or failure about yourself  1 0 2 3  Trouble concentrating 0 1 1 1   Moving slowly or fidgety/restless 0 1 1 2   Suicidal thoughts 0 0 1 3  PHQ-9 Score 7 6 13 21   Difficult doing work/chores Somewhat difficult - Very difficult Very difficult    HYPERLIPIDEMIA Hyperlipidemia status: just started zetia Satisfied with current treatment?  yes Side effects:  no Medication compliance: excellent compliance Past cholesterol meds: atorvastatin Supplements: none Aspirin:  no The ASCVD Risk score Mikey Bussing DC Jr., et al., 2013) failed to calculate for the following reasons:   The patient has a prior MI or stroke diagnosis Chest pain:  no Coronary artery disease:  no Family history CAD:  yes  RESTLESS LEGS- no better. Gabapentin makes her too sleepy. Would  like to go back to see neurology Duration: chronic Discomfort description:  Creepy, crawling, jerking Pain: yes Location: lower legs Bilateral: yes Symmetric: yes Severity: severe Onset:  sudden Frequency:  intermittent Symptoms only occur while legs at rest: yes Sudden unintentional leg jerking: yes Bed partner bothered by leg movements: no LE numbness: yes Decreased sensation: yes Weakness: no Insomnia: no Daytime somnolence: yes Fatigue: yes Status: worse  Relevant past medical, surgical, family and social history reviewed and updated as indicated. Interim medical history since our last visit reviewed. Allergies and medications reviewed and updated.  Review of Systems  Constitutional: Negative.   Respiratory: Negative.   Cardiovascular: Negative.   Neurological: Positive for numbness. Negative for dizziness, tremors, seizures, syncope, facial asymmetry, speech difficulty, weakness, light-headedness and headaches.  Psychiatric/Behavioral: Negative.     Per HPI unless specifically indicated above     Objective:    BP 119/82 (BP Location: Left Arm, Patient Position: Sitting, Cuff Size: Large)   Pulse 60   Temp 98.7 F (37.1 C)   Wt 178 lb 6 oz (80.9 kg)   SpO2 97%   BMI 33.70 kg/m   Wt Readings from Last 3 Encounters:  11/18/16 178 lb 6 oz (80.9 kg)  08/28/16 171 lb 12.8 oz (77.9 kg)  05/25/16 168 lb 14.4 oz (76.6 kg)    Physical Exam  Constitutional: She is oriented to person, place, and time. She appears well-developed and well-nourished. No  distress.  HENT:  Head: Normocephalic and atraumatic.  Right Ear: Hearing normal.  Left Ear: Hearing normal.  Nose: Nose normal.  Eyes: Conjunctivae and lids are normal. Right eye exhibits no discharge. Left eye exhibits no discharge. No scleral icterus.  Cardiovascular: Normal rate, regular rhythm, normal heart sounds and intact distal pulses.  Exam reveals no gallop and no friction rub.   No murmur  heard. Pulmonary/Chest: Effort normal and breath sounds normal. No respiratory distress. She has no wheezes. She has no rales. She exhibits no tenderness.  Musculoskeletal: Normal range of motion.  Neurological: She is alert and oriented to person, place, and time.  Skin: Skin is warm, dry and intact. No rash noted. She is not diaphoretic. No erythema. No pallor.  Psychiatric: She has a normal mood and affect. Her speech is normal and behavior is normal. Judgment and thought content normal. Cognition and memory are normal.  Nursing note and vitals reviewed.   Results for orders placed or performed in visit on 08/28/16  Microscopic Examination  Result Value Ref Range   WBC, UA 0-5 0 - 5 /hpf   RBC, UA 0-2 0 - 2 /hpf   Epithelial Cells (non renal) 0-10 0 - 10 /hpf   Bacteria, UA Few None seen/Few  Bayer DCA Hb A1c Waived  Result Value Ref Range   Bayer DCA Hb A1c Waived 5.5 <7.0 %  CBC with Differential/Platelet  Result Value Ref Range   WBC 7.4 3.4 - 10.8 x10E3/uL   RBC 4.48 3.77 - 5.28 x10E6/uL   Hemoglobin 13.1 11.1 - 15.9 g/dL   Hematocrit 39.6 34.0 - 46.6 %   MCV 88 79 - 97 fL   MCH 29.2 26.6 - 33.0 pg   MCHC 33.1 31.5 - 35.7 g/dL   RDW 14.0 12.3 - 15.4 %   Platelets 349 150 - 379 x10E3/uL   Neutrophils 51 Not Estab. %   Lymphs 35 Not Estab. %   Monocytes 6 Not Estab. %   Eos 5 Not Estab. %   Basos 3 Not Estab. %   Neutrophils Absolute 3.8 1.4 - 7.0 x10E3/uL   Lymphocytes Absolute 2.6 0.7 - 3.1 x10E3/uL   Monocytes Absolute 0.4 0.1 - 0.9 x10E3/uL   EOS (ABSOLUTE) 0.3 0.0 - 0.4 x10E3/uL   Basophils Absolute 0.2 0.0 - 0.2 x10E3/uL   Immature Granulocytes 0 Not Estab. %   Immature Grans (Abs) 0.0 0.0 - 0.1 x10E3/uL  Comprehensive metabolic panel  Result Value Ref Range   Glucose 103 (H) 65 - 99 mg/dL   BUN 11 6 - 24 mg/dL   Creatinine, Ser 0.68 0.57 - 1.00 mg/dL   GFR calc non Af Amer 102 >59 mL/min/1.73   GFR calc Af Amer 118 >59 mL/min/1.73   BUN/Creatinine Ratio 16  9 - 23   Sodium 142 134 - 144 mmol/L   Potassium 4.5 3.5 - 5.2 mmol/L   Chloride 103 96 - 106 mmol/L   CO2 25 20 - 29 mmol/L   Calcium 9.5 8.7 - 10.2 mg/dL   Total Protein 7.0 6.0 - 8.5 g/dL   Albumin 4.4 3.5 - 5.5 g/dL   Globulin, Total 2.6 1.5 - 4.5 g/dL   Albumin/Globulin Ratio 1.7 1.2 - 2.2   Bilirubin Total 0.3 0.0 - 1.2 mg/dL   Alkaline Phosphatase 65 39 - 117 IU/L   AST 14 0 - 40 IU/L   ALT 21 0 - 32 IU/L  Lipid Panel w/o Chol/HDL Ratio  Result Value Ref Range  Cholesterol, Total 251 (H) 100 - 199 mg/dL   Triglycerides 267 (H) 0 - 149 mg/dL   HDL 35 (L) >39 mg/dL   VLDL Cholesterol Cal 53 (H) 5 - 40 mg/dL   LDL Calculated 163 (H) 0 - 99 mg/dL  Microalbumin, Urine Waived  Result Value Ref Range   Microalb, Ur Waived 10 0 - 19 mg/L   Creatinine, Urine Waived 300 10 - 300 mg/dL   Microalb/Creat Ratio <30 <30 mg/g  TSH  Result Value Ref Range   TSH 3.540 0.450 - 4.500 uIU/mL  UA/M w/rflx Culture, Routine  Result Value Ref Range   Specific Gravity, UA 1.020 1.005 - 1.030   pH, UA 5.5 5.0 - 7.5   Color, UA Yellow Yellow   Appearance Ur Clear Clear   Leukocytes, UA Negative Negative   Protein, UA Negative Negative/Trace   Glucose, UA Negative Negative   Ketones, UA Negative Negative   RBC, UA Negative Negative   Bilirubin, UA Negative Negative   Urobilinogen, Ur 0.2 0.2 - 1.0 mg/dL   Nitrite, UA Negative Negative   Microscopic Examination See below:       Assessment & Plan:   Problem List Items Addressed This Visit      Other   RLS (restless legs syndrome)    Doing worse. Would like to see neurology. Referral generated today. Call with any concerns.       Relevant Orders   Comprehensive metabolic panel   Depression, major, single episode, severe ([place]) - Primary    Improved, but anxiety is acting up. Will increase celexa to 20mg  and recheck in 1 month.       Relevant Medications   citalopram (CELEXA) 20 MG tablet   Hyperlipidemia    Rechecking levels  today. Await results. Call with any concerns.       Relevant Orders   Lipid Panel w/o Chol/HDL Ratio   Comprehensive metabolic panel       Follow up plan: Return in about 4 weeks (around 12/16/2016) for Follow up mood.

## 2016-11-18 NOTE — Assessment & Plan Note (Signed)
Doing worse. Would like to see neurology. Referral generated today. Call with any concerns.

## 2016-11-18 NOTE — Telephone Encounter (Signed)
-----   Message from Jerene Pitch, [place] sent at 11/18/2016 11:35 AM EDT ----- Appointment scheduled for tomorrow, patient is able to go to that appointment, she will call and reschedule.  ----- Message ----- From: Valerie Roys, DO Sent: 11/18/2016   9:38 AM To: Tiffany L Reel, CMA  Please call Dr. Trena Platt office and see if Jacee needs a new referral for RLS (she saw him for paresthesias in 11/17). If not, can we set her up an appointment?

## 2016-11-18 NOTE — Telephone Encounter (Signed)
Called patient back and informed her appointment is still scheduled and informed patient that CMA was informed.

## 2016-11-19 LAB — COMPREHENSIVE METABOLIC PANEL
ALK PHOS: 68 IU/L (ref 39–117)
ALT: 27 IU/L (ref 0–32)
AST: 21 IU/L (ref 0–40)
Albumin/Globulin Ratio: 1.5 (ref 1.2–2.2)
Albumin: 4 g/dL (ref 3.5–5.5)
BUN/Creatinine Ratio: 11 (ref 9–23)
BUN: 9 mg/dL (ref 6–24)
Bilirubin Total: 0.2 mg/dL (ref 0.0–1.2)
CALCIUM: 9 mg/dL (ref 8.7–10.2)
CO2: 23 mmol/L (ref 20–29)
CREATININE: 0.79 mg/dL (ref 0.57–1.00)
Chloride: 100 mmol/L (ref 96–106)
GFR calc Af Amer: 101 mL/min/{1.73_m2} (ref >59)
GFR, EST NON AFRICAN AMERICAN: 88 mL/min/{1.73_m2} (ref >59)
GLUCOSE: 150 mg/dL — AB (ref 65–99)
Globulin, Total: 2.6 g/dL (ref 1.5–4.5)
Potassium: 4.3 mmol/L (ref 3.5–5.2)
SODIUM: 140 mmol/L (ref 134–144)
Total Protein: 6.6 g/dL (ref 6.0–8.5)

## 2016-11-19 LAB — LIPID PANEL W/O CHOL/HDL RATIO
Cholesterol, Total: 215 mg/dL — ABNORMAL HIGH (ref 100–199)
HDL: 34 mg/dL — AB (ref >39)
LDL CALC: 118 mg/dL — AB (ref 0–99)
Triglycerides: 315 mg/dL — ABNORMAL HIGH (ref 0–149)
VLDL CHOLESTEROL CAL: 63 mg/dL — AB (ref 5–40)

## 2016-11-20 ENCOUNTER — Other Ambulatory Visit: Payer: Self-pay | Admitting: Family Medicine

## 2016-11-20 MED ORDER — FENOFIBRATE 160 MG PO TABS: 160.0000 mg | ORAL_TABLET | Freq: Every day | ORAL | 3 refills | Status: DC

## 2016-11-20 NOTE — Progress Notes (Signed)
fenfibrat

## 2016-12-14 ENCOUNTER — Other Ambulatory Visit: Payer: Self-pay | Admitting: Family Medicine

## 2016-12-16 ENCOUNTER — Ambulatory Visit: Payer: BLUE CROSS/BLUE SHIELD | Admitting: Family Medicine

## 2016-12-18 ENCOUNTER — Encounter: Payer: Self-pay | Admitting: Family Medicine

## 2016-12-18 ENCOUNTER — Ambulatory Visit (INDEPENDENT_AMBULATORY_CARE_PROVIDER_SITE_OTHER): Payer: BLUE CROSS/BLUE SHIELD | Admitting: Family Medicine

## 2016-12-18 VITALS — BP 126/82 | HR 59 | Temp 98.4°F | Wt 173.1 lb

## 2016-12-18 DIAGNOSIS — Z720 Tobacco use: Secondary | ICD-10-CM

## 2016-12-18 DIAGNOSIS — F322 Major depressive disorder, single episode, severe without psychotic features: Secondary | ICD-10-CM | POA: Diagnosis not present

## 2016-12-18 MED ORDER — NICOTINE 14 MG/24HR TD PT24: 14.0000 mg | MEDICATED_PATCH | Freq: Every day | TRANSDERMAL | 3 refills | Status: DC

## 2016-12-18 MED ORDER — CITALOPRAM HYDROBROMIDE 40 MG PO TABS: 40.0000 mg | ORAL_TABLET | Freq: Every day | ORAL | 3 refills | Status: DC

## 2016-12-18 NOTE — Patient Instructions (Signed)
Dr. Manuella Ghazi [place],  [address]

## 2016-12-18 NOTE — Assessment & Plan Note (Signed)
Improved, but not there. Will increase her citalopram to 40mg  and recheck 1 month. Call with any concerns.

## 2016-12-18 NOTE — Progress Notes (Signed)
BP 126/82 (BP Location: Left Arm, Patient Position: Sitting, Cuff Size: Normal)   Pulse (!) 59   Temp 98.4 F (36.9 C)   Wt 173 lb 2 oz (78.5 kg)   SpO2 99%   BMI 32.71 kg/m    Subjective:    Patient ID: Brandi Acevedo, female    DOB: 1966/03/13, 51 y.o.   MRN: [id number]  HPI: Brandi Acevedo is a 51 y.o. female  Chief Complaint  Patient presents with  . Depression   DEPRESSION Mood status: better Satisfied with current treatment?: no Symptom severity: moderate  Duration of current treatment : months Side effects: no Medication compliance: excellent compliance Psychotherapy/counseling: no  Previous psychiatric medications: celexa Depressed mood: yes Anxious mood: yes Anhedonia: no Significant weight loss or gain: no Insomnia: no  Fatigue: yes Feelings of worthlessness or guilt: no Impaired concentration/indecisiveness: no Suicidal ideations: no Hopelessness: no Crying spells: yes Depression screen [facility] 2/9 12/18/2016 11/18/2016 12/17/2015 06/04/2015 05/21/2015  Decreased Interest 1 1 1 2 2   Down, Depressed, Hopeless 1 1 0 1 3  PHQ - 2 Score 2 2 1 3 5   Altered sleeping 2 1 2 2 2   Tired, decreased energy 2 2 1 1 3   Change in appetite 0 1 - 2 2  Feeling bad or failure about yourself  1 1 0 2 3  Trouble concentrating 1 0 1 1 1   Moving slowly or fidgety/restless 1 0 1 1 2   Suicidal thoughts 0 0 0 1 3  PHQ-9 Score 9 7 6 13 21   Difficult doing work/chores Somewhat difficult Somewhat difficult - Very difficult Very difficult   RLS has been causing problems. Seeing Dr Manuella Ghazi on 10/15.   SMOKING CESSATION Smoking Status: current every day smoker Smoking Amount: 1/3-1/2 ppd Smoking Onset: as teenager Smoking Quit Date: not set Smoking triggers: stress Type of tobacco use: cigarettes Children in the house: no Other household members who smoke: no  Relevant past medical, surgical, family and social history reviewed and updated as indicated. Interim medical history  since our last visit reviewed. Allergies and medications reviewed and updated.  Review of Systems  Constitutional: Negative.   Respiratory: Negative.   Cardiovascular: Negative.   Psychiatric/Behavioral: Negative.     Per HPI unless specifically indicated above     Objective:    BP 126/82 (BP Location: Left Arm, Patient Position: Sitting, Cuff Size: Normal)   Pulse (!) 59   Temp 98.4 F (36.9 C)   Wt 173 lb 2 oz (78.5 kg)   SpO2 99%   BMI 32.71 kg/m   Wt Readings from Last 3 Encounters:  12/18/16 173 lb 2 oz (78.5 kg)  11/18/16 178 lb 6 oz (80.9 kg)  08/28/16 171 lb 12.8 oz (77.9 kg)    Physical Exam  Constitutional: She is oriented to person, place, and time. She appears well-developed and well-nourished. No distress.  HENT:  Head: Normocephalic and atraumatic.  Right Ear: Hearing normal.  Left Ear: Hearing normal.  Nose: Nose normal.  Eyes: Conjunctivae and lids are normal. Right eye exhibits no discharge. Left eye exhibits no discharge. No scleral icterus.  Cardiovascular: Normal rate, regular rhythm, normal heart sounds and intact distal pulses.  Exam reveals no gallop and no friction rub.   No murmur heard. Pulmonary/Chest: Effort normal and breath sounds normal. No respiratory distress. She has no wheezes. She has no rales. She exhibits no tenderness.  Musculoskeletal: Normal range of motion.  Neurological: She is alert and oriented to  person, place, and time.  Skin: Skin is warm, dry and intact. No rash noted. She is not diaphoretic. No erythema. No pallor.  Psychiatric: She has a normal mood and affect. Her speech is normal and behavior is normal. Judgment and thought content normal. Cognition and memory are normal.  Nursing note and vitals reviewed.   Results for orders placed or performed in visit on 11/18/16  Lipid Panel w/o Chol/HDL Ratio  Result Value Ref Range   Cholesterol, Total 215 (H) 100 - 199 mg/dL   Triglycerides 315 (H) 0 - 149 mg/dL   HDL 34  (L) >39 mg/dL   VLDL Cholesterol Cal 63 (H) 5 - 40 mg/dL   LDL Calculated 118 (H) 0 - 99 mg/dL  Comprehensive metabolic panel  Result Value Ref Range   Glucose 150 (H) 65 - 99 mg/dL   BUN 9 6 - 24 mg/dL   Creatinine, Ser 0.79 0.57 - 1.00 mg/dL   GFR calc non Af Amer 88 >59 mL/min/1.73   GFR calc Af Amer 101 >59 mL/min/1.73   BUN/Creatinine Ratio 11 9 - 23   Sodium 140 134 - 144 mmol/L   Potassium 4.3 3.5 - 5.2 mmol/L   Chloride 100 96 - 106 mmol/L   CO2 23 20 - 29 mmol/L   Calcium 9.0 8.7 - 10.2 mg/dL   Total Protein 6.6 6.0 - 8.5 g/dL   Albumin 4.0 3.5 - 5.5 g/dL   Globulin, Total 2.6 1.5 - 4.5 g/dL   Albumin/Globulin Ratio 1.5 1.2 - 2.2   Bilirubin Total 0.2 0.0 - 1.2 mg/dL   Alkaline Phosphatase 68 39 - 117 IU/L   AST 21 0 - 40 IU/L   ALT 27 0 - 32 IU/L      Assessment & Plan:   Problem List Items Addressed This Visit      Other   Tobacco abuse    Will start nicoderm patches and recheck in 1 month.       Depression, major, single episode, severe ([place]) - Primary    Improved, but not there. Will increase her citalopram to 40mg  and recheck 1 month. Call with any concerns.       Relevant Medications   citalopram (CELEXA) 40 MG tablet       Follow up plan: Return in about 4 weeks (around 01/15/2017) for follow up smoking and mood.

## 2016-12-18 NOTE — Assessment & Plan Note (Signed)
Will start nicoderm patches and recheck in 1 month.

## 2017-01-13 ENCOUNTER — Ambulatory Visit (INDEPENDENT_AMBULATORY_CARE_PROVIDER_SITE_OTHER): Payer: BLUE CROSS/BLUE SHIELD | Admitting: Family Medicine

## 2017-01-13 ENCOUNTER — Encounter: Payer: Self-pay | Admitting: Family Medicine

## 2017-01-13 VITALS — BP 111/74 | HR 76 | Temp 98.8°F | Wt 170.0 lb

## 2017-01-13 DIAGNOSIS — J449 Chronic obstructive pulmonary disease, unspecified: Secondary | ICD-10-CM | POA: Diagnosis not present

## 2017-01-13 MED ORDER — DOXYCYCLINE HYCLATE 100 MG PO TABS: 100.0000 mg | ORAL_TABLET | Freq: Two times a day (BID) | ORAL | 0 refills | Status: DC

## 2017-01-13 MED ORDER — HYDROCOD POLST-CPM POLST ER 10-8 MG/5ML PO SUER: 5.0000 mL | Freq: Two times a day (BID) | ORAL | 0 refills | Status: DC | PRN

## 2017-01-13 MED ORDER — PREDNISONE 10 MG PO TABS: ORAL_TABLET | ORAL | 0 refills | Status: DC

## 2017-01-13 NOTE — Progress Notes (Signed)
   BP 111/74   Pulse 76   Temp 98.8 F (37.1 C)   Wt 170 lb (77.1 kg)   SpO2 97%   BMI 32.12 kg/m    Subjective:    Patient ID: Brandi Acevedo, female    DOB: 05-31-1965, 51 y.o.   MRN: [id number]  HPI: Brandi Acevedo is a 51 y.o. female  Chief Complaint  Patient presents with  . URI    x 3-4 days. Head congestion, right ear pain, sore throat yesterday, sinus drainage, productive cough, runny nose. No fever. No chest congestion.   Patient here with 4 days of congestion, right ear pain, sore throat, hacking cough, rhinorrhea. Denies fevers, chills, body aches. Has been trying allergy tablets for several days with no relief. Doing anoro daily, does not have a hose for her nebulizer machine so hasn't ben doing nebulizers. Daughter has also been sick.   Relevant past medical, surgical, family and social history reviewed and updated as indicated. Interim medical history since our last visit reviewed. Allergies and medications reviewed and updated.  Review of Systems  Constitutional: Negative.   HENT: Positive for congestion, ear pain and sore throat.   Eyes: Negative.   Respiratory: Positive for cough, shortness of breath and wheezing.   Cardiovascular: Negative.   Gastrointestinal: Negative.   Musculoskeletal: Negative.   Neurological: Negative.   Psychiatric/Behavioral: Negative.     Per HPI unless specifically indicated above     Objective:    BP 111/74   Pulse 76   Temp 98.8 F (37.1 C)   Wt 170 lb (77.1 kg)   SpO2 97%   BMI 32.12 kg/m   Wt Readings from Last 3 Encounters:  01/13/17 170 lb (77.1 kg)  12/18/16 173 lb 2 oz (78.5 kg)  11/18/16 178 lb 6 oz (80.9 kg)    Physical Exam  Constitutional: She is oriented to person, place, and time. She appears well-developed and well-nourished. No distress.  HENT:  Head: Atraumatic.  Mild middle ear effusion b/l Oropharynx erythematous   Eyes: Pupils are equal, round, and reactive to light. Conjunctivae are  normal.  Neck: Normal range of motion. Neck supple.  Cardiovascular: Normal rate and normal heart sounds.   Pulmonary/Chest: Effort normal. No respiratory distress. She has wheezes.  Musculoskeletal: Normal range of motion.  Neurological: She is alert and oriented to person, place, and time.  Skin: Skin is warm and dry.  Psychiatric: She has a normal mood and affect. Her behavior is normal.  Nursing note and vitals reviewed.      Assessment & Plan:   Problem List Items Addressed This Visit      Respiratory   Chronic obstructive pulmonary disease (COPD) ([place]) - Primary    With acute exacerbation from URI. Script given for new tubing for neb machine. Continue inhaler regimen. Start doxycycline, tussionex. Follow up if worsening or no improvement      Relevant Medications   cetirizine (ZYRTEC) 10 MG tablet   predniSONE (DELTASONE) 10 MG tablet   chlorpheniramine-HYDROcodone (TUSSIONEX PENNKINETIC ER) 10-8 MG/5ML SUER       Follow up plan: Return for as scheduled.

## 2017-01-13 NOTE — Assessment & Plan Note (Signed)
With acute exacerbation from URI. Script given for new tubing for neb machine. Continue inhaler regimen. Start doxycycline, tussionex. Follow up if worsening or no improvement

## 2017-01-13 NOTE — Patient Instructions (Signed)
Follow up as needed

## 2017-01-18 ENCOUNTER — Ambulatory Visit: Payer: BLUE CROSS/BLUE SHIELD | Admitting: Family Medicine

## 2017-01-22 ENCOUNTER — Ambulatory Visit (INDEPENDENT_AMBULATORY_CARE_PROVIDER_SITE_OTHER): Payer: BLUE CROSS/BLUE SHIELD | Admitting: Family Medicine

## 2017-01-22 ENCOUNTER — Encounter: Payer: Self-pay | Admitting: Family Medicine

## 2017-01-22 VITALS — BP 117/74 | HR 54 | Temp 97.9°F | Wt 175.3 lb

## 2017-01-22 DIAGNOSIS — Z720 Tobacco use: Secondary | ICD-10-CM

## 2017-01-22 DIAGNOSIS — F322 Major depressive disorder, single episode, severe without psychotic features: Secondary | ICD-10-CM | POA: Diagnosis not present

## 2017-01-22 DIAGNOSIS — J449 Chronic obstructive pulmonary disease, unspecified: Secondary | ICD-10-CM | POA: Diagnosis not present

## 2017-01-22 NOTE — Assessment & Plan Note (Signed)
Has cut down from 1/2ppd to 1/3ppd- will use paper tape on the patches. Call with any concerns. Continue to cut down.

## 2017-01-22 NOTE — Assessment & Plan Note (Signed)
Much better on current regimen. Continue current regimen. Continue to monitor. Call with any concerns.

## 2017-01-22 NOTE — Progress Notes (Signed)
BP 117/74 (BP Location: Left Arm, Patient Position: Sitting, Cuff Size: Large)   Pulse (!) 54   Temp 97.9 F (36.6 C)   Wt 175 lb 5 oz (79.5 kg)   SpO2 97%   BMI 33.13 kg/m    Subjective:    Patient ID: Brandi Acevedo, female    DOB: 1966/02/14, 51 y.o.   MRN: [id number]  HPI: Brandi Acevedo is a 51 y.o. female  Chief Complaint  Patient presents with  . Nicotine Dependence  . Depression   DEPRESSION Mood status: better Satisfied with current treatment?: yes Symptom severity: mild  Duration of current treatment : months Side effects: no Medication compliance: excellent compliance Psychotherapy/counseling: yes in the past Depressed mood: yes Anxious mood: no Anhedonia: no Significant weight loss or gain: no Insomnia: no  Fatigue: yes Feelings of worthlessness or guilt: no Impaired concentration/indecisiveness: no Suicidal ideations: no Hopelessness: no Crying spells: no Depression screen [facility] 2/9 12/18/2016 11/18/2016 12/17/2015 06/04/2015 05/21/2015  Decreased Interest 1 1 1 2 2   Down, Depressed, Hopeless 1 1 0 1 3  PHQ - 2 Score 2 2 1 3 5   Altered sleeping 2 1 2 2 2   Tired, decreased energy 2 2 1 1 3   Change in appetite 0 1 - 2 2  Feeling bad or failure about yourself  1 1 0 2 3  Trouble concentrating 1 0 1 1 1   Moving slowly or fidgety/restless 1 0 1 1 2   Suicidal thoughts 0 0 0 1 3  PHQ-9 Score 9 7 6 13 21   Difficult doing work/chores Somewhat difficult Somewhat difficult - Very difficult Very difficult   SMOKING CESSATION- patches keep falling off, they aren't staying on. Working well when they're on Smoking Status: current every day smoker Smoking Amount: 1/3 Smoking Onset: as teenager Smoking Quit Date: not set Smoking triggers: stress Type of tobacco use: cigarettes Children in the house: no Other household members who smoke: no  Breathing is better, but she's still coughing. Not sure what she should do about her lungs  Relevant past medical,  surgical, family and social history reviewed and updated as indicated. Interim medical history since our last visit reviewed. Allergies and medications reviewed and updated.  Review of Systems  Constitutional: Negative.   Respiratory: Negative.   Cardiovascular: Negative.   Psychiatric/Behavioral: Negative.     Per HPI unless specifically indicated above     Objective:    BP 117/74 (BP Location: Left Arm, Patient Position: Sitting, Cuff Size: Large)   Pulse (!) 54   Temp 97.9 F (36.6 C)   Wt 175 lb 5 oz (79.5 kg)   SpO2 97%   BMI 33.13 kg/m   Wt Readings from Last 3 Encounters:  01/22/17 175 lb 5 oz (79.5 kg)  01/13/17 170 lb (77.1 kg)  12/18/16 173 lb 2 oz (78.5 kg)    Physical Exam  Constitutional: She is oriented to person, place, and time. She appears well-developed and well-nourished. No distress.  HENT:  Head: Normocephalic and atraumatic.  Right Ear: Hearing normal.  Left Ear: Hearing normal.  Nose: Nose normal.  Eyes: Conjunctivae and lids are normal. Right eye exhibits no discharge. Left eye exhibits no discharge. No scleral icterus.  Cardiovascular: Normal rate, regular rhythm, normal heart sounds and intact distal pulses. Exam reveals no gallop and no friction rub.  No murmur heard. Pulmonary/Chest: Effort normal and breath sounds normal. No respiratory distress. She has no wheezes. She has no rales. She exhibits no  tenderness.  Musculoskeletal: Normal range of motion.  Neurological: She is alert and oriented to person, place, and time.  Skin: Skin is warm, dry and intact. No rash noted. She is not diaphoretic. No erythema. No pallor.  Psychiatric: She has a normal mood and affect. Her speech is normal and behavior is normal. Judgment and thought content normal. Cognition and memory are normal.    Results for orders placed or performed in visit on 11/18/16  Lipid Panel w/o Chol/HDL Ratio  Result Value Ref Range   Cholesterol, Total 215 (H) 100 - 199 mg/dL     Triglycerides 315 (H) 0 - 149 mg/dL   HDL 34 (L) >39 mg/dL   VLDL Cholesterol Cal 63 (H) 5 - 40 mg/dL   LDL Calculated 118 (H) 0 - 99 mg/dL  Comprehensive metabolic panel  Result Value Ref Range   Glucose 150 (H) 65 - 99 mg/dL   BUN 9 6 - 24 mg/dL   Creatinine, Ser 0.79 0.57 - 1.00 mg/dL   GFR calc non Af Amer 88 >59 mL/min/1.73   GFR calc Af Amer 101 >59 mL/min/1.73   BUN/Creatinine Ratio 11 9 - 23   Sodium 140 134 - 144 mmol/L   Potassium 4.3 3.5 - 5.2 mmol/L   Chloride 100 96 - 106 mmol/L   CO2 23 20 - 29 mmol/L   Calcium 9.0 8.7 - 10.2 mg/dL   Total Protein 6.6 6.0 - 8.5 g/dL   Albumin 4.0 3.5 - 5.5 g/dL   Globulin, Total 2.6 1.5 - 4.5 g/dL   Albumin/Globulin Ratio 1.5 1.2 - 2.2   Bilirubin Total 0.2 0.0 - 1.2 mg/dL   Alkaline Phosphatase 68 39 - 117 IU/L   AST 21 0 - 40 IU/L   ALT 27 0 - 32 IU/L      Assessment & Plan:   Problem List Items Addressed This Visit      Respiratory   Chronic obstructive pulmonary disease (COPD) ([place])    No longer in exacerbation. Breathing improved, but not there. Will change anoro to trelegy and recheck in 2-3 weeks to see how she's doing. Sample given today.        Other   Tobacco abuse    Has cut down from 1/2ppd to 1/3ppd- will use paper tape on the patches. Call with any concerns. Continue to cut down.       Depression, major, single episode, severe ([place])    Much better on current regimen. Continue current regimen. Continue to monitor. Call with any concerns.           Follow up plan: Return in about 4 weeks (around 02/19/2017) for 6 month follow up on [facility].

## 2017-01-22 NOTE — Assessment & Plan Note (Signed)
No longer in exacerbation. Breathing improved, but not there. Will change anoro to trelegy and recheck in 2-3 weeks to see how she's doing. Sample given today.

## 2017-03-11 ENCOUNTER — Other Ambulatory Visit: Payer: Self-pay | Admitting: Family Medicine

## 2017-03-18 ENCOUNTER — Telehealth: Payer: Self-pay

## 2017-03-18 MED ORDER — GEMFIBROZIL 600 MG PO TABS: 600.0000 mg | ORAL_TABLET | Freq: Two times a day (BID) | ORAL | 1 refills | Status: DC

## 2017-03-18 NOTE — Telephone Encounter (Signed)
Rx sent to her pharmacy- gemfibrozil

## 2017-03-18 NOTE — Telephone Encounter (Signed)
Fenofibrate is on back order, they would like to know if she can be switched to something else. If so please send it in.

## 2017-04-22 ENCOUNTER — Ambulatory Visit (INDEPENDENT_AMBULATORY_CARE_PROVIDER_SITE_OTHER): Payer: BLUE CROSS/BLUE SHIELD | Admitting: Family Medicine

## 2017-04-22 ENCOUNTER — Encounter: Payer: Self-pay | Admitting: Family Medicine

## 2017-04-22 VITALS — BP 121/83 | HR 63 | Temp 98.5°F

## 2017-04-22 DIAGNOSIS — J449 Chronic obstructive pulmonary disease, unspecified: Secondary | ICD-10-CM

## 2017-04-22 DIAGNOSIS — J441 Chronic obstructive pulmonary disease with (acute) exacerbation: Secondary | ICD-10-CM | POA: Diagnosis not present

## 2017-04-22 DIAGNOSIS — R509 Fever, unspecified: Secondary | ICD-10-CM

## 2017-04-22 LAB — VERITOR FLU A/B WAIVED
INFLUENZA A: NEGATIVE
INFLUENZA B: NEGATIVE

## 2017-04-22 MED ORDER — BENZONATATE 200 MG PO CAPS: 200.0000 mg | ORAL_CAPSULE | Freq: Two times a day (BID) | ORAL | 0 refills | Status: DC | PRN

## 2017-04-22 MED ORDER — HYDROCOD POLST-CPM POLST ER 10-8 MG/5ML PO SUER: 5.0000 mL | Freq: Every evening | ORAL | 0 refills | Status: DC | PRN

## 2017-04-22 MED ORDER — PREDNISONE 10 MG PO TABS
ORAL_TABLET | ORAL | 0 refills | Status: DC
Start: 2017-04-22 — End: 2017-07-05

## 2017-04-22 NOTE — Progress Notes (Signed)
BP 121/83 (BP Location: Left Arm, Patient Position: Sitting, Cuff Size: Normal)   Pulse 63   Temp 98.5 F (36.9 C)   SpO2 96%    Subjective:    Patient ID: Brandi Acevedo, female    DOB: Mar 10, 1966, 52 y.o.   MRN: [id number]  HPI: Brandi Acevedo is a 52 y.o. female  Chief Complaint  Patient presents with  . URI    X 1 week   UPPER RESPIRATORY TRACT INFECTION Duration: About a week Worst symptom: congestion, cough Fever: no Cough: yes Shortness of breath: no Wheezing: yes Chest pain: no Chest tightness: yes Chest congestion: yes Nasal congestion: yes Runny nose: yes Post nasal drip: yes Sneezing: yes Sore throat: no Swollen glands: no Sinus pressure: no Headache: yes Face pain: no Toothache: no Ear pain: no  Ear pressure: no  Eyes red/itching:no Eye drainage/crusting: no  Vomiting: no Rash: no Fatigue: yes Sick contacts: yes Strep contacts: no  Context: stable Recurrent sinusitis: no Relief with OTC cold/cough medications: no  Treatments attempted: none   Relevant past medical, surgical, family and social history reviewed and updated as indicated. Interim medical history since our last visit reviewed. Allergies and medications reviewed and updated.  Review of Systems  Constitutional: Positive for chills, diaphoresis and fatigue. Negative for activity change, appetite change, fever and unexpected weight change.  HENT: Positive for congestion, postnasal drip, rhinorrhea and sneezing. Negative for dental problem, drooling, ear discharge, ear pain, facial swelling, hearing loss, mouth sores, nosebleeds, sinus pressure, sinus pain, sore throat, tinnitus, trouble swallowing and voice change.   Respiratory: Positive for cough, chest tightness and shortness of breath. Negative for apnea, choking, wheezing and stridor.   Cardiovascular: Negative.   Neurological: Negative.   Psychiatric/Behavioral: Negative.     Per HPI unless specifically indicated  above     Objective:    BP 121/83 (BP Location: Left Arm, Patient Position: Sitting, Cuff Size: Normal)   Pulse 63   Temp 98.5 F (36.9 C)   SpO2 96%   Wt Readings from Last 3 Encounters:  01/22/17 175 lb 5 oz (79.5 kg)  01/13/17 170 lb (77.1 kg)  12/18/16 173 lb 2 oz (78.5 kg)    Physical Exam  Constitutional: She is oriented to person, place, and time. She appears well-developed and well-nourished. No distress.  HENT:  Head: Normocephalic and atraumatic.  Right Ear: Hearing and external ear normal.  Left Ear: Hearing and external ear normal.  Nose: Nose normal.  Mouth/Throat: Oropharynx is clear and moist. No oropharyngeal exudate.  Eyes: Conjunctivae, EOM and lids are normal. Pupils are equal, round, and reactive to light. Right eye exhibits no discharge. Left eye exhibits no discharge. No scleral icterus.  Neck: Normal range of motion. Neck supple. No JVD present. No tracheal deviation present. No thyromegaly present.  Cardiovascular: Normal rate, regular rhythm, normal heart sounds and intact distal pulses. Exam reveals no gallop and no friction rub.  No murmur heard. Pulmonary/Chest: Effort normal. No stridor. No respiratory distress. She has wheezes. She has no rales. She exhibits no tenderness.  Musculoskeletal: Normal range of motion.  Lymphadenopathy:    She has no cervical adenopathy.  Neurological: She is alert and oriented to person, place, and time.  Skin: Skin is warm, dry and intact. No rash noted. She is not diaphoretic. No erythema. No pallor.  Psychiatric: She has a normal mood and affect. Her speech is normal and behavior is normal. Judgment and thought content normal. Cognition and memory  are normal.  Nursing note and vitals reviewed.   Results for orders placed or performed in visit on 11/18/16  Lipid Panel w/o Chol/HDL Ratio  Result Value Ref Range   Cholesterol, Total 215 (H) 100 - 199 mg/dL   Triglycerides 315 (H) 0 - 149 mg/dL   HDL 34 (L) >39 mg/dL    VLDL Cholesterol Cal 63 (H) 5 - 40 mg/dL   LDL Calculated 118 (H) 0 - 99 mg/dL  Comprehensive metabolic panel  Result Value Ref Range   Glucose 150 (H) 65 - 99 mg/dL   BUN 9 6 - 24 mg/dL   Creatinine, Ser 0.79 0.57 - 1.00 mg/dL   GFR calc non Af Amer 88 >59 mL/min/1.73   GFR calc Af Amer 101 >59 mL/min/1.73   BUN/Creatinine Ratio 11 9 - 23   Sodium 140 134 - 144 mmol/L   Potassium 4.3 3.5 - 5.2 mmol/L   Chloride 100 96 - 106 mmol/L   CO2 23 20 - 29 mmol/L   Calcium 9.0 8.7 - 10.2 mg/dL   Total Protein 6.6 6.0 - 8.5 g/dL   Albumin 4.0 3.5 - 5.5 g/dL   Globulin, Total 2.6 1.5 - 4.5 g/dL   Albumin/Globulin Ratio 1.5 1.2 - 2.2   Bilirubin Total 0.2 0.0 - 1.2 mg/dL   Alkaline Phosphatase 68 39 - 117 IU/L   AST 21 0 - 40 IU/L   ALT 27 0 - 32 IU/L      Assessment & Plan:   Problem List Items Addressed This Visit      Respiratory   Chronic obstructive pulmonary disease (COPD) ([place])    In acute exacerbation. Will treat with prednisone, tussionex and tessalon perles for comfort. Call with any concerns.       Relevant Medications   predniSONE (DELTASONE) 10 MG tablet   chlorpheniramine-HYDROcodone (TUSSIONEX PENNKINETIC ER) 10-8 MG/5ML SUER   benzonatate (TESSALON) 200 MG capsule    Other Visit Diagnoses    COPD exacerbation ([place])    -  Primary   In acute exacerbation. Will treat with prednisone, tussionex and tessalon perles for comfort. Call with any concerns.    Relevant Medications   predniSONE (DELTASONE) 10 MG tablet   chlorpheniramine-HYDROcodone (TUSSIONEX PENNKINETIC ER) 10-8 MG/5ML SUER   benzonatate (TESSALON) 200 MG capsule   Fever, unspecified fever cause       Negative flu.   Relevant Orders   Veritor Flu A/B Waived       Follow up plan: Return 2 weeks, for lung recheck.

## 2017-04-22 NOTE — Assessment & Plan Note (Signed)
In acute exacerbation. Will treat with prednisone, tussionex and tessalon perles for comfort. Call with any concerns.

## 2017-04-27 ENCOUNTER — Telehealth: Payer: Self-pay | Admitting: Family Medicine

## 2017-04-27 NOTE — Telephone Encounter (Signed)
Copied from [place] #[id number]. Topic: Quick Communication - Rx Refill/Question >> Apr 27, 2017  9:45 AM Carolyn Stare wrote: Medication   chlorpheniramine-HYDROcodone ([place] ER) 10-8 MG/5ML SUER   Has the patient contacted their pharmacy yes    Rx is a pick up  and would like a call back [phone]   Agent: Please be advised that RX refills may take up to 3 business days. We ask that you follow-up with your pharmacy.

## 2017-04-27 NOTE — Telephone Encounter (Signed)
We do not give refills on this without an appointment

## 2017-04-27 NOTE — Telephone Encounter (Signed)
Patient notified

## 2017-04-28 ENCOUNTER — Telehealth: Payer: Self-pay

## 2017-04-28 MED ORDER — GABAPENTIN 100 MG PO CAPS: 100.0000 mg | ORAL_CAPSULE | Freq: Every day | ORAL | 3 refills | Status: DC

## 2017-04-28 NOTE — Telephone Encounter (Signed)
Gabapentin 100 mg 

## 2017-07-05 ENCOUNTER — Ambulatory Visit (INDEPENDENT_AMBULATORY_CARE_PROVIDER_SITE_OTHER): Payer: BLUE CROSS/BLUE SHIELD | Admitting: Family Medicine

## 2017-07-05 ENCOUNTER — Encounter: Payer: Self-pay | Admitting: Family Medicine

## 2017-07-05 VITALS — BP 120/86 | HR 71 | Temp 98.2°F | Wt 167.0 lb

## 2017-07-05 DIAGNOSIS — F322 Major depressive disorder, single episode, severe without psychotic features: Secondary | ICD-10-CM

## 2017-07-05 DIAGNOSIS — H6693 Otitis media, unspecified, bilateral: Secondary | ICD-10-CM

## 2017-07-05 MED ORDER — AZITHROMYCIN 250 MG PO TABS: ORAL_TABLET | ORAL | 0 refills | Status: DC

## 2017-07-05 MED ORDER — CITALOPRAM HYDROBROMIDE 40 MG PO TABS: 40.0000 mg | ORAL_TABLET | Freq: Every day | ORAL | 3 refills | Status: DC

## 2017-07-05 NOTE — Progress Notes (Signed)
BP 120/86 (BP Location: Left Arm, Patient Position: Sitting, Cuff Size: Normal)   Pulse 71   Temp 98.2 F (36.8 C)   Wt 167 lb (75.8 kg)   SpO2 97%   BMI 31.55 kg/m    Subjective:    Patient ID: Brandi Acevedo, female    DOB: 05-Aug-1965, 52 y.o.   MRN: [id number]  HPI: Brandi Acevedo is a 52 y.o. female  Chief Complaint  Patient presents with  . URI    cough, nasal congestion, sore throat, and ear pain   UPPER RESPIRATORY TRACT INFECTION Duration: 5 days Worst symptom: ear pain Fever: no, but chills and sweats Cough: yes Shortness of breath: no Wheezing: no Chest pain: no Chest tightness: yes Chest congestion: yes Nasal congestion: yes Runny nose: yes Post nasal drip: yes Sneezing: no Sore throat: yes Swollen glands: yes Sinus pressure: no Headache: yes Face pain: no Toothache: no Ear pain: yes bilateral Ear pressure: yes bilateral Eyes red/itching:no Eye drainage/crusting: no  Vomiting: no Rash: no Fatigue: yes Sick contacts: yes Strep contacts: no  Context: worse Recurrent sinusitis: no Relief with OTC cold/cough medications: no  Treatments attempted: none  DEPRESSION Mood status: stable Satisfied with current treatment?: yes Symptom severity: moderate  Duration of current treatment : chronic Side effects: no Medication compliance: excellent compliance Psychotherapy/counseling: no  Previous psychiatric medications: celexa Depressed mood: yes Anxious mood: no Anhedonia: no Significant weight loss or gain: no Insomnia: no  Fatigue: yes Feelings of worthlessness or guilt: no Impaired concentration/indecisiveness: no Suicidal ideations: no Hopelessness: no Crying spells: no Depression screen [facility] 2/9 07/05/2017 12/18/2016 11/18/2016 12/17/2015 06/04/2015  Decreased Interest 1 1 1 1 2   Down, Depressed, Hopeless 1 1 1  0 1  PHQ - 2 Score 2 2 2 1 3   Altered sleeping 3 2 1 2 2   Tired, decreased energy 2 2 2 1 1   Change in appetite 1 0 1 - 2    Feeling bad or failure about yourself  0 1 1 0 2  Trouble concentrating 0 1 0 1 1  Moving slowly or fidgety/restless 0 1 0 1 1  Suicidal thoughts 0 0 0 0 1  PHQ-9 Score 8 9 7 6 13   Difficult doing work/chores Somewhat difficult Somewhat difficult Somewhat difficult - Very difficult    Relevant past medical, surgical, family and social history reviewed and updated as indicated. Interim medical history since our last visit reviewed. Allergies and medications reviewed and updated.  Review of Systems  Constitutional: Positive for fatigue and fever. Negative for activity change, appetite change, chills, diaphoresis and unexpected weight change.  HENT: Positive for congestion, ear pain, hearing loss, postnasal drip, rhinorrhea, sneezing and sore throat. Negative for dental problem, drooling, ear discharge, facial swelling, mouth sores, nosebleeds, sinus pressure, sinus pain, tinnitus, trouble swallowing and voice change.   Eyes: Negative.   Respiratory: Positive for cough. Negative for apnea, choking, chest tightness, shortness of breath and stridor.   Cardiovascular: Negative.   Psychiatric/Behavioral: Negative.     Per HPI unless specifically indicated above     Objective:    BP 120/86 (BP Location: Left Arm, Patient Position: Sitting, Cuff Size: Normal)   Pulse 71   Temp 98.2 F (36.8 C)   Wt 167 lb (75.8 kg)   SpO2 97%   BMI 31.55 kg/m   Wt Readings from Last 3 Encounters:  07/05/17 167 lb (75.8 kg)  01/22/17 175 lb 5 oz (79.5 kg)  01/13/17 170 lb (77.1 kg)  Physical Exam  Constitutional: She is oriented to person, place, and time. She appears well-developed and well-nourished. No distress.  HENT:  Head: Normocephalic and atraumatic.  Right Ear: Hearing, external ear and ear canal normal. Tympanic membrane is erythematous and bulging.  Left Ear: Hearing, external ear and ear canal normal. Tympanic membrane is erythematous and bulging.  Nose: Nose normal.  Mouth/Throat:  Uvula is midline, oropharynx is clear and moist and mucous membranes are normal. No oropharyngeal exudate.  Eyes: Pupils are equal, round, and reactive to light. Conjunctivae, EOM and lids are normal. Right eye exhibits no discharge. Left eye exhibits no discharge. No scleral icterus.  Neck: Normal range of motion. Neck supple. No JVD present. No tracheal deviation present. No thyromegaly present.  Cardiovascular: Normal rate, regular rhythm, normal heart sounds and intact distal pulses. Exam reveals no gallop and no friction rub.  No murmur heard. Pulmonary/Chest: Effort normal and breath sounds normal. No stridor. No respiratory distress. She has no wheezes. She has no rales. She exhibits no tenderness.  Musculoskeletal: Normal range of motion.  Lymphadenopathy:    She has cervical adenopathy.  Neurological: She is alert and oriented to person, place, and time.  Skin: Skin is warm, dry and intact. Capillary refill takes less than 2 seconds. No rash noted. She is not diaphoretic. No erythema. No pallor.  Psychiatric: She has a normal mood and affect. Her speech is normal and behavior is normal. Judgment and thought content normal. Cognition and memory are normal.  Nursing note and vitals reviewed.   Results for orders placed or performed in visit on 04/22/17  Veritor Flu A/B Waived  Result Value Ref Range   Influenza A Negative Negative   Influenza B Negative Negative      Assessment & Plan:   Problem List Items Addressed This Visit      Other   Depression, major, single episode, severe ([place])    Stable. Continue current regimen. Continue to monitor. Call with any concerns. Refills given today.      Relevant Medications   citalopram (CELEXA) 40 MG tablet    Other Visit Diagnoses    Acute bilateral otitis media    -  Primary   PCN allergic. Will treat with azithromycin. Call with any concerns. Not living local- advised recheck in 2 weeks to confirm resolution   Relevant  Medications   azithromycin (ZITHROMAX) 250 MG tablet       Follow up plan: Return in about 2 weeks (around 07/19/2017) for recheck ears.

## 2017-07-05 NOTE — Assessment & Plan Note (Signed)
Stable. Continue current regimen. Continue to monitor. Call with any concerns. Refills given today.

## 2017-07-19 ENCOUNTER — Ambulatory Visit (INDEPENDENT_AMBULATORY_CARE_PROVIDER_SITE_OTHER): Payer: BLUE CROSS/BLUE SHIELD | Admitting: Family Medicine

## 2017-07-19 ENCOUNTER — Encounter: Payer: Self-pay | Admitting: Family Medicine

## 2017-07-19 VITALS — BP 128/82 | HR 68 | Temp 98.0°F | Wt 168.1 lb

## 2017-07-19 DIAGNOSIS — H6982 Other specified disorders of Eustachian tube, left ear: Secondary | ICD-10-CM

## 2017-07-19 MED ORDER — PREDNISONE 50 MG PO TABS: 50.0000 mg | ORAL_TABLET | Freq: Every day | ORAL | 0 refills | Status: DC

## 2017-07-19 NOTE — Patient Instructions (Signed)
Eustachian Tube Dysfunction The eustachian tube connects the middle ear to the back of the nose. It regulates air pressure in the middle ear by allowing air to move between the ear and nose. It also helps to drain fluid from the middle ear space. When the eustachian tube does not function properly, air pressure, fluid, or both can build up in the middle ear. Eustachian tube dysfunction can affect one or both ears. What are the causes? This condition happens when the eustachian tube becomes blocked or cannot open normally. This may result from:  Ear infections.  Colds and other upper respiratory infections.  Allergies.  Irritation, such as from cigarette smoke or acid from the stomach coming up into the esophagus (gastroesophageal reflux).  Sudden changes in air pressure, such as from descending in an airplane.  Abnormal growths in the nose or throat, such as nasal polyps, tumors, or enlarged tissue at the back of the throat (adenoids).  What increases the risk? This condition may be more likely to develop in people who smoke and people who are overweight. Eustachian tube dysfunction may also be more likely to develop in children, especially children who have:  Certain birth defects of the mouth, such as cleft palate.  Large tonsils and adenoids.  What are the signs or symptoms? Symptoms of this condition may include:  A feeling of fullness in the ear.  Ear pain.  Clicking or popping noises in the ear.  Ringing in the ear.  Hearing loss.  Loss of balance.  Symptoms may get worse when the air pressure around you changes, such as when you travel to an area of high elevation or fly on an airplane. How is this diagnosed? This condition may be diagnosed based on:  Your symptoms.  A physical exam of your ear, nose, and throat.  Tests, such as those that measure: ? The movement of your eardrum (tympanogram). ? Your hearing (audiometry).  How is this treated? Treatment  depends on the cause and severity of your condition. If your symptoms are mild, you may be able to relieve your symptoms by moving air into ("popping") your ears. If you have symptoms of fluid in your ears, treatment may include:  Decongestants.  Antihistamines.  Nasal sprays or ear drops that contain medicines that reduce swelling (steroids).  In some cases, you may need to have a procedure to drain the fluid in your eardrum (myringotomy). In this procedure, a small tube is placed in the eardrum to:  Drain the fluid.  Restore the air in the middle ear space.  Follow these instructions at home:  Take over-the-counter and prescription medicines only as told by your health care provider.  Use techniques to help pop your ears as recommended by your health care provider. These may include: ? Chewing gum. ? Yawning. ? Frequent, forceful swallowing. ? Closing your mouth, holding your nose closed, and gently blowing as if you are trying to blow air out of your nose.  Do not do any of the following until your health care provider approves: ? Travel to high altitudes. ? Fly in airplanes. ? Work in a pressurized cabin or room. ? Scuba dive.  Keep your ears dry. Dry your ears completely after showering or bathing.  Do not smoke.  Keep all follow-up visits as told by your health care provider. This is important. Contact a health care provider if:  Your symptoms do not go away after treatment.  Your symptoms come back after treatment.  You are   unable to pop your ears.  You have: ? A fever. ? Pain in your ear. ? Pain in your head or neck. ? Fluid draining from your ear.  Your hearing suddenly changes.  You become very dizzy.  You lose your balance. This information is not intended to replace advice given to you by your health care provider. Make sure you discuss any questions you have with your health care provider. Document Released: 03/29/2015 Document Revised: 08/08/2015  Document Reviewed: 03/21/2014 Elsevier Interactive Patient Education  2018 [employer]  

## 2017-07-19 NOTE — Progress Notes (Signed)
BP 128/82 (BP Location: Left Arm, Patient Position: Sitting, Cuff Size: Normal)   Pulse 68   Temp 98 F (36.7 C)   Wt 168 lb 1 oz (76.2 kg)   SpO2 97%   BMI 31.76 kg/m    Subjective:    Patient ID: Brandi Acevedo, female    DOB: 1965-06-15, 52 y.o.   MRN: [id number]  HPI: Brandi Acevedo is a 52 y.o. female  Chief Complaint  Patient presents with  . Ear Pain    Left   EAR PAIN Duration: Thursday Involved ear(s): left Severity:  moderate  Quality:  aching Fever: no Otorrhea: no Upper respiratory infection symptoms: no Pruritus: yes Hearing loss: yes Water immersion no Using Q-tips: no Recurrent otitis media: no Status: worse Treatments attempted: antibiotics  Relevant past medical, surgical, family and social history reviewed and updated as indicated. Interim medical history since our last visit reviewed. Allergies and medications reviewed and updated.  Review of Systems  Constitutional: Negative.   HENT: Positive for ear pain. Negative for congestion, dental problem, drooling, ear discharge, facial swelling, hearing loss, mouth sores, nosebleeds, postnasal drip, rhinorrhea, sinus pressure, sinus pain, sneezing, sore throat, tinnitus, trouble swallowing and voice change.   Respiratory: Negative.   Cardiovascular: Negative.   Psychiatric/Behavioral: Negative.     Per HPI unless specifically indicated above     Objective:    BP 128/82 (BP Location: Left Arm, Patient Position: Sitting, Cuff Size: Normal)   Pulse 68   Temp 98 F (36.7 C)   Wt 168 lb 1 oz (76.2 kg)   SpO2 97%   BMI 31.76 kg/m   Wt Readings from Last 3 Encounters:  07/19/17 168 lb 1 oz (76.2 kg)  07/05/17 167 lb (75.8 kg)  01/22/17 175 lb 5 oz (79.5 kg)    Physical Exam  Constitutional: She is oriented to person, place, and time. She appears well-developed and well-nourished. No distress.  HENT:  Head: Normocephalic and atraumatic.  Right Ear: Hearing, tympanic membrane, external  ear and ear canal normal.  Left Ear: Hearing, external ear and ear canal normal. Tympanic membrane is bulging.  Nose: Nose normal.  Mouth/Throat: Uvula is midline, oropharynx is clear and moist and mucous membranes are normal. No oropharyngeal exudate.  Eyes: Pupils are equal, round, and reactive to light. Conjunctivae, EOM and lids are normal. Right eye exhibits no discharge. Left eye exhibits no discharge. No scleral icterus.  Neck: Normal range of motion. Neck supple. No JVD present. No tracheal deviation present. No thyromegaly present.  Cardiovascular: Normal rate, regular rhythm, normal heart sounds and intact distal pulses. Exam reveals no gallop and no friction rub.  No murmur heard. Pulmonary/Chest: Effort normal and breath sounds normal. No stridor. No respiratory distress. She has no wheezes. She has no rales. She exhibits no tenderness.  Musculoskeletal: Normal range of motion.  Lymphadenopathy:    She has no cervical adenopathy.  Neurological: She is alert and oriented to person, place, and time.  Skin: Skin is warm, dry and intact. Capillary refill takes less than 2 seconds. No rash noted. She is not diaphoretic. No erythema. No pallor.  Psychiatric: She has a normal mood and affect. Her speech is normal and behavior is normal. Judgment and thought content normal. Cognition and memory are normal.  Nursing note and vitals reviewed.   Results for orders placed or performed in visit on 04/22/17  Veritor Flu A/B Waived  Result Value Ref Range   Influenza A Negative Negative  Influenza B Negative Negative      Assessment & Plan:   Problem List Items Addressed This Visit    None    Visit Diagnoses    Dysfunction of left eustachian tube    -  Primary   Infection resolved. Will treat with prednisone. Call if not getting better.        Follow up plan: Return if symptoms worsen or fail to improve.

## 2017-07-20 ENCOUNTER — Ambulatory Visit: Payer: Self-pay | Admitting: Family Medicine

## 2017-08-03 ENCOUNTER — Telehealth: Payer: Self-pay

## 2017-08-03 MED ORDER — PANTOPRAZOLE SODIUM 40 MG PO TBEC: 40.0000 mg | DELAYED_RELEASE_TABLET | Freq: Every day | ORAL | 1 refills | Status: DC

## 2017-08-03 NOTE — Telephone Encounter (Signed)
90 day supply of pantoprazole

## 2017-08-14 IMAGING — CR DG KNEE COMPLETE 4+V*R*
1 series · 4 of 4 positions shown · non-contrast
Comparison: No prior.

CLINICAL DATA: Right knee pain.

EXAM:
RIGHT KNEE - COMPLETE 4+ VIEW

[Series 1: ap · 0.17mm/px · 4 of 4 slices shown]
[im 1/4]
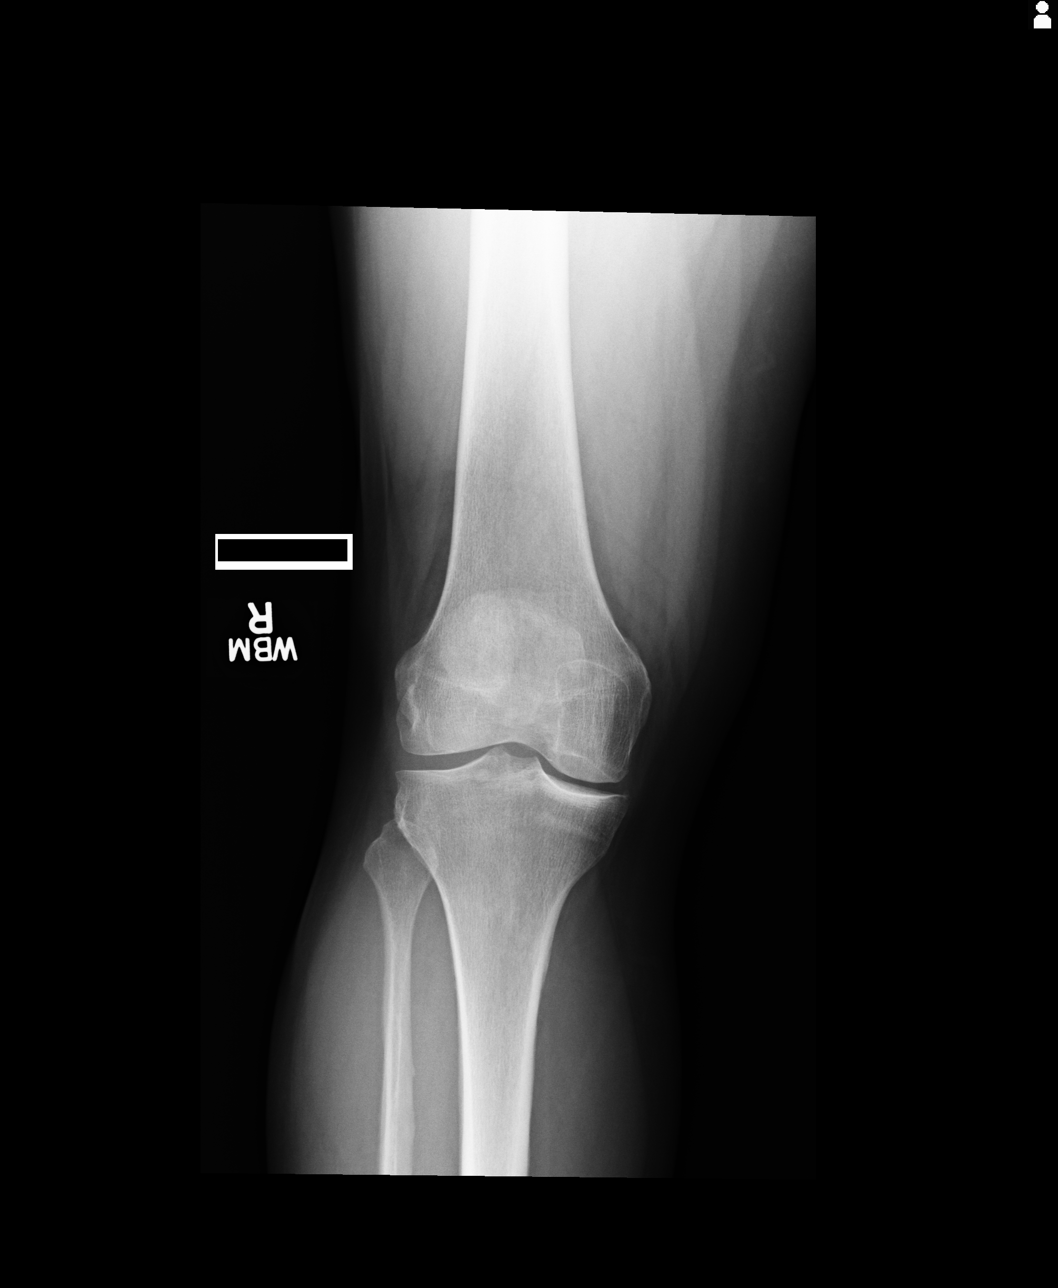
[im 2/4]
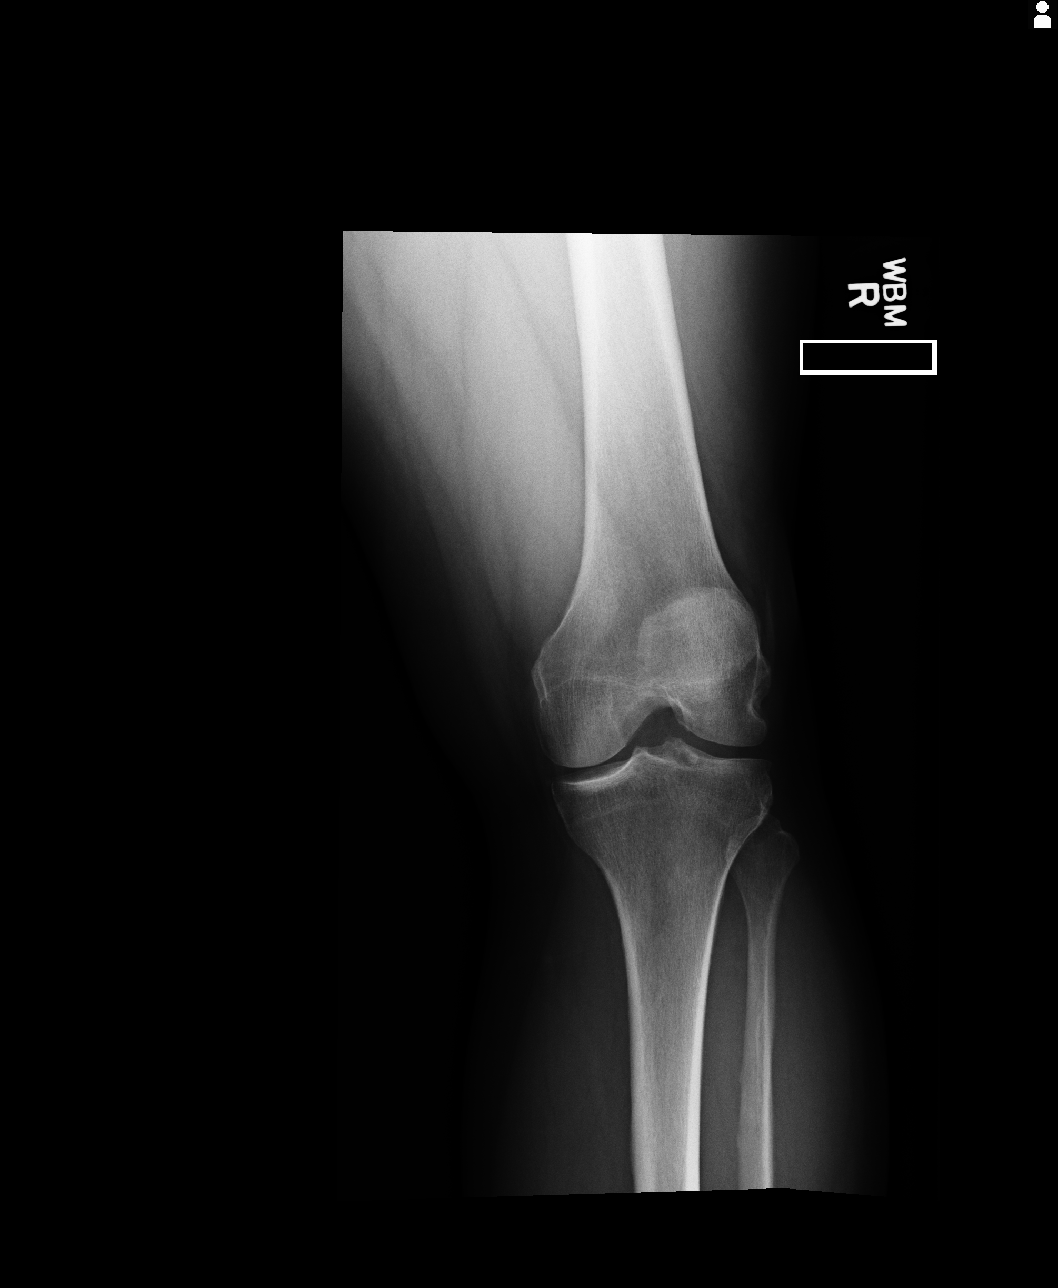
[im 3/4]
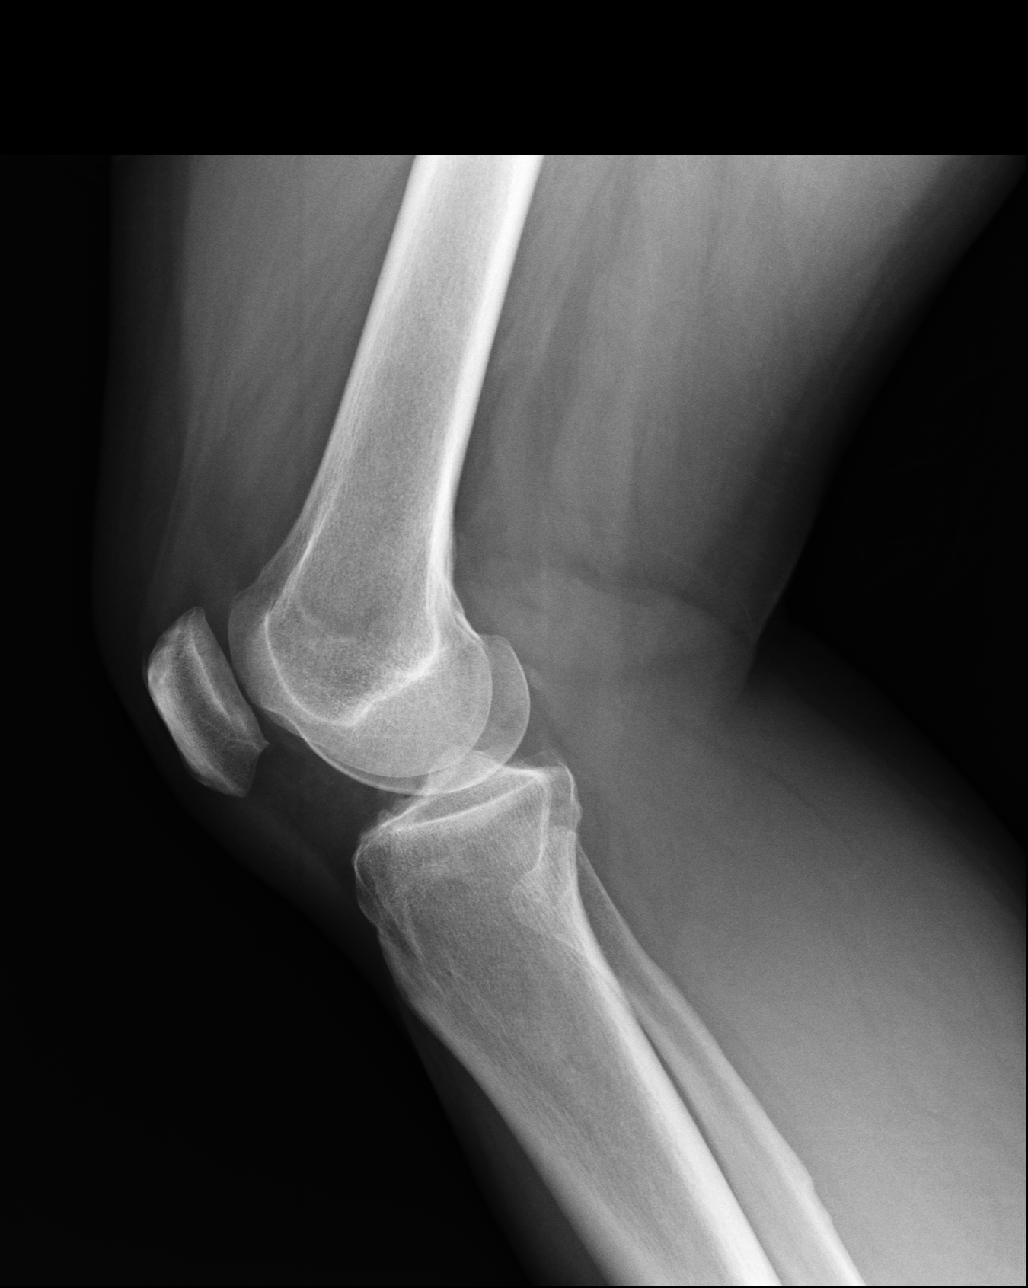
[im 4/4]
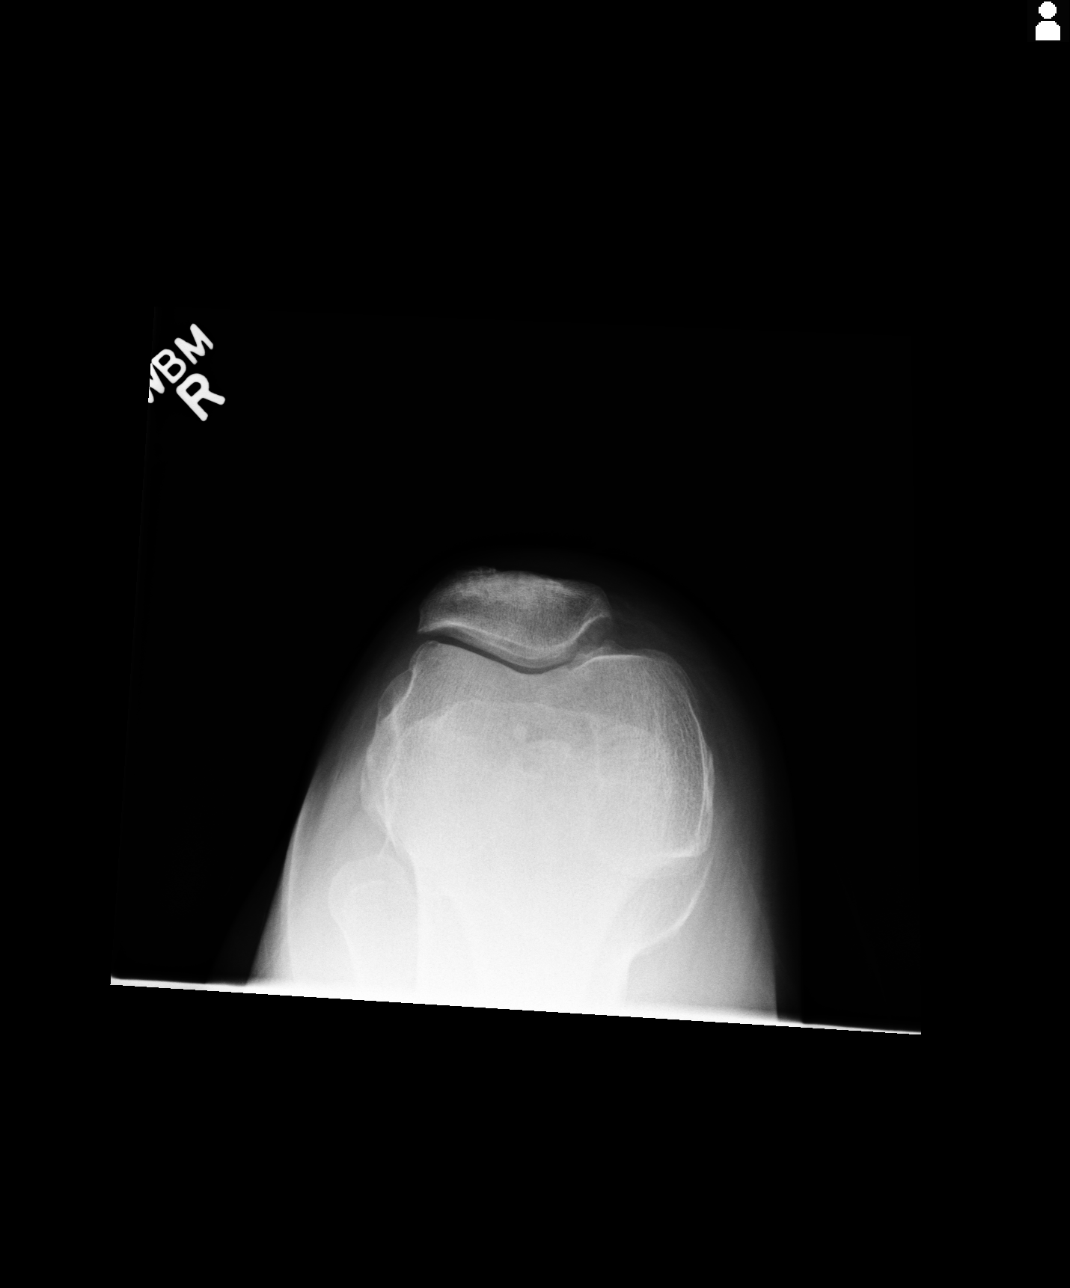

[4 of 4 positions shown; findings below may reference images not displayed]

FINDINGS: No acute bony or joint abnormality identified. No evidence of
fracture or dislocation. Degenerative changes present about the
right knee.
IMPRESSION: Mild degenerative changes, most prominent about the medial
compartment. No acute abnormality.

## 2017-09-04 IMAGING — CR DG HAND COMPLETE 3+V*R*
1 series · 3 of 3 positions shown · non-contrast
Comparison: None.

CLINICAL DATA: Punched wall, with right fifth metacarpal pain.
Initial encounter.

EXAM:
RIGHT HAND - COMPLETE 3+ VIEW

[Series 1: x hand pa right · 0.14mm/px · 3 of 3 slices shown]
[im 1/3]
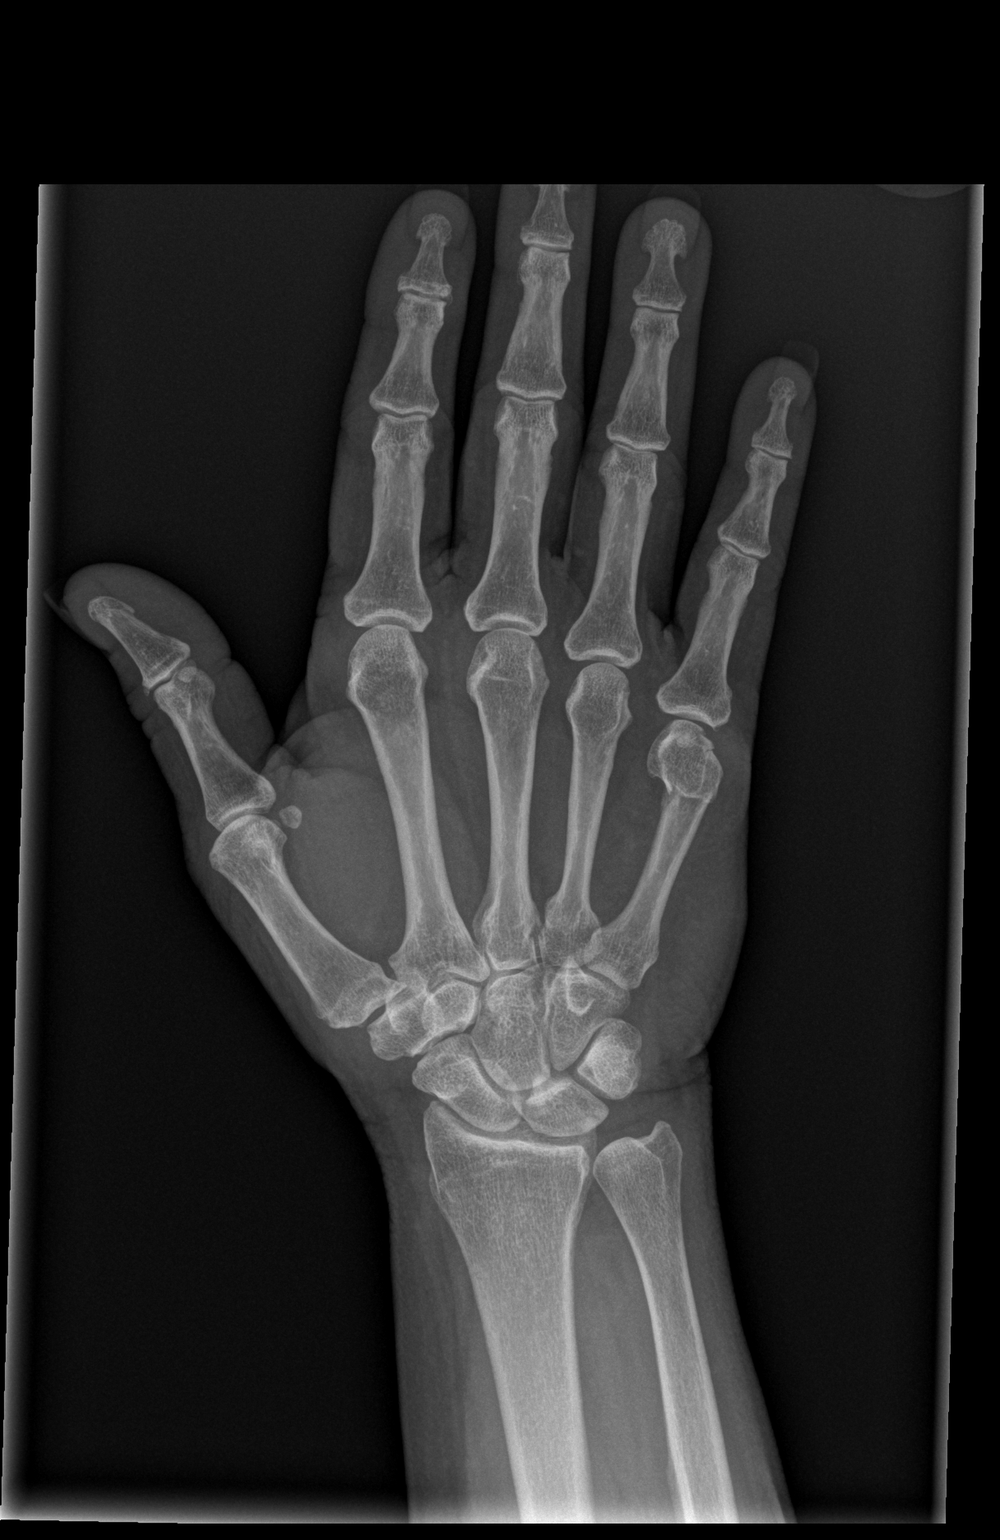
[im 2/3]
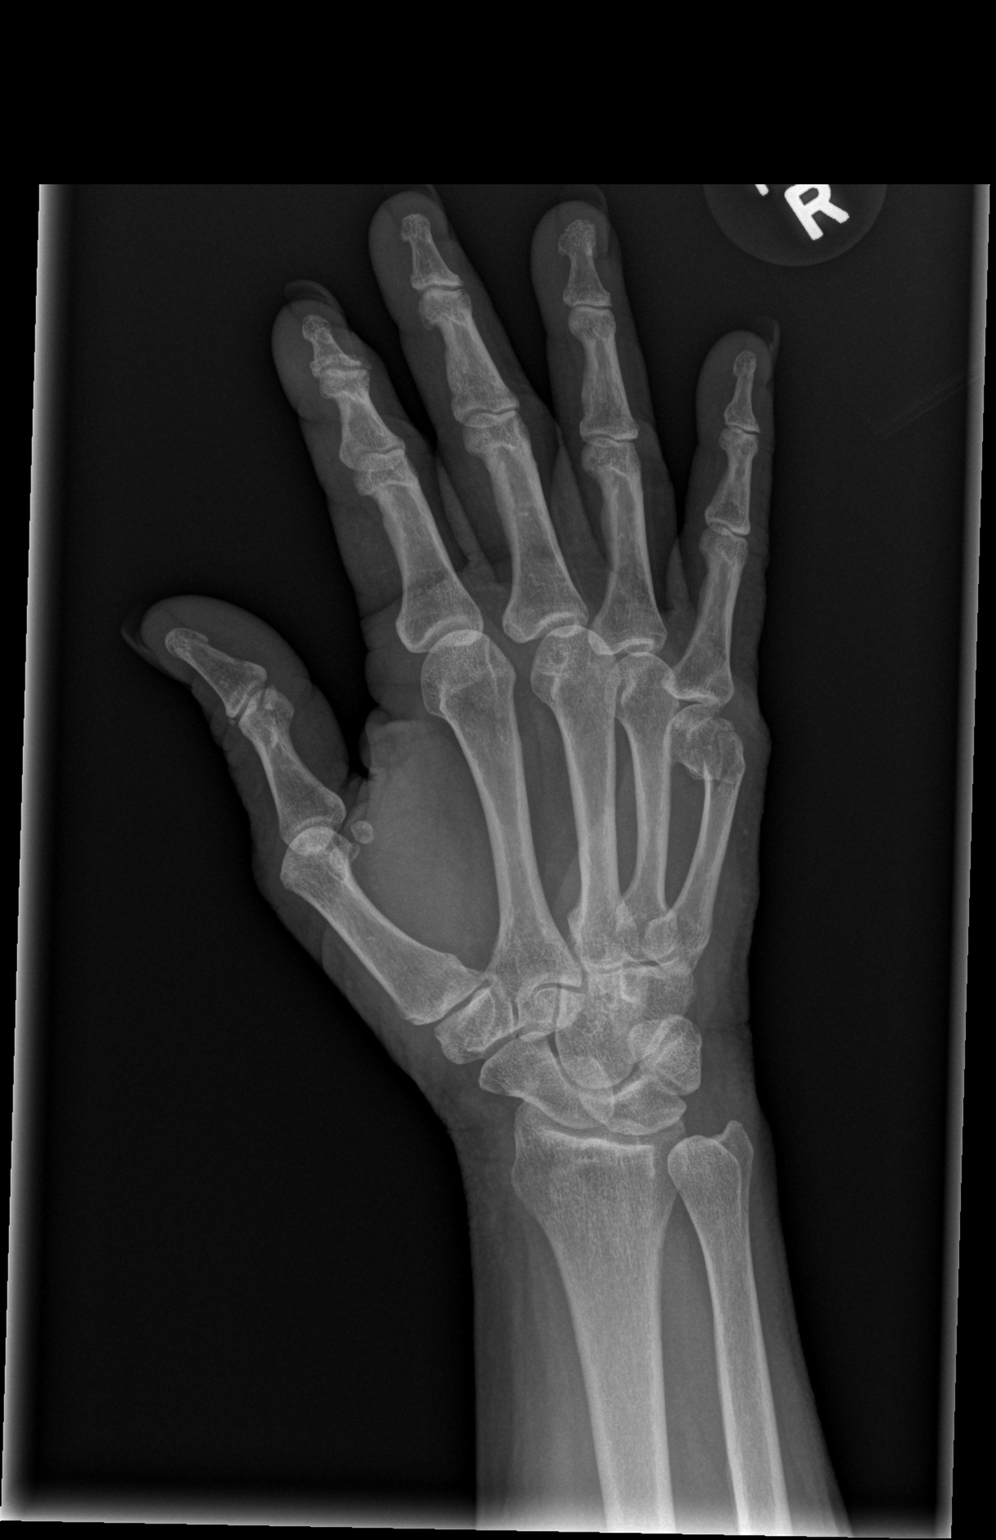
[im 3/3]
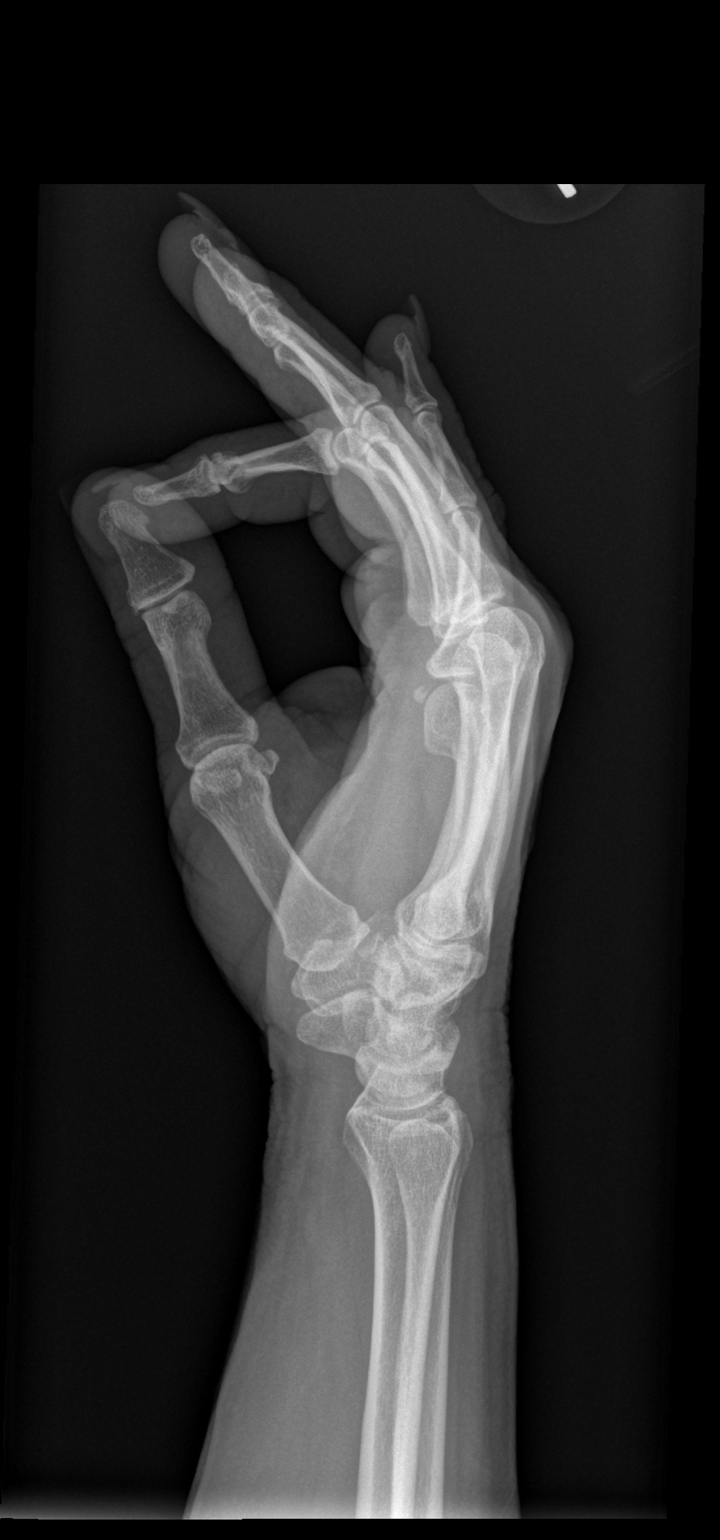

[3 of 3 positions shown; findings below may reference images not displayed]

FINDINGS: There is a mildly comminuted fracture at the distal fifth
metacarpal, extending to the edge of the fifth metacarpophalangeal
joint. There is mild radial and volar angulation.

The carpal rows are intact, and demonstrate normal alignment. The
soft tissues are unremarkable in appearance.
IMPRESSION: Mildly comminuted fracture at the distal fifth metacarpal, extending
to the edge of the fifth metacarpophalangeal joint. Mild radial and
volar angulation.

## 2017-10-23 ENCOUNTER — Other Ambulatory Visit: Payer: Self-pay | Admitting: Family Medicine

## 2018-01-19 ENCOUNTER — Other Ambulatory Visit: Payer: Self-pay | Admitting: Family Medicine

## 2018-01-19 NOTE — Telephone Encounter (Signed)
Requested Prescriptions  Pending Prescriptions Disp Refills  . pantoprazole (PROTONIX) 40 MG tablet [Pharmacy Med Name: PANTOPRAZOLE SOD 40MG  TAB] 90 tablet 1    Sig: TAKE 1 TABLET BY MOUTH ONCE DAILY     Gastroenterology: Proton Pump Inhibitors Passed - 01/19/2018  3:14 PM      Passed - Valid encounter within last 12 months    Recent Outpatient Visits          6 months ago Dysfunction of left eustachian tube   [facility], Megan P, DO   6 months ago Acute bilateral otitis media   [facility] [place], Megan P, DO   9 months ago COPD exacerbation [facility])   [place], Megan P, DO   12 months ago Depression, major, single episode, severe [facility])   [place], Megan P, DO   1 year ago Chronic obstructive pulmonary disease, unspecified COPD type [facility])   [facility] [place], [place]

## 2018-10-07 ENCOUNTER — Ambulatory Visit: Payer: BC Managed Care – PPO | Admitting: Family Medicine

## 2018-10-07 ENCOUNTER — Encounter: Payer: Self-pay | Admitting: Family Medicine

## 2018-10-07 ENCOUNTER — Other Ambulatory Visit: Payer: Self-pay

## 2018-10-07 VITALS — BP 151/98 | HR 62 | Temp 98.6°F

## 2018-10-07 DIAGNOSIS — R7989 Other specified abnormal findings of blood chemistry: Secondary | ICD-10-CM

## 2018-10-07 DIAGNOSIS — K0889 Other specified disorders of teeth and supporting structures: Secondary | ICD-10-CM

## 2018-10-07 DIAGNOSIS — F322 Major depressive disorder, single episode, severe without psychotic features: Secondary | ICD-10-CM | POA: Diagnosis not present

## 2018-10-07 DIAGNOSIS — E782 Mixed hyperlipidemia: Secondary | ICD-10-CM

## 2018-10-07 DIAGNOSIS — J449 Chronic obstructive pulmonary disease, unspecified: Secondary | ICD-10-CM

## 2018-10-07 DIAGNOSIS — R7301 Impaired fasting glucose: Secondary | ICD-10-CM

## 2018-10-07 DIAGNOSIS — G2581 Restless legs syndrome: Secondary | ICD-10-CM

## 2018-10-07 MED ORDER — ROPINIROLE HCL ER 2 MG PO TB24: 2.0000 mg | ORAL_TABLET | Freq: Every day | ORAL | 1 refills | Status: DC

## 2018-10-07 MED ORDER — PANTOPRAZOLE SODIUM 40 MG PO TBEC: 40.0000 mg | DELAYED_RELEASE_TABLET | Freq: Every day | ORAL | 1 refills | Status: DC

## 2018-10-07 MED ORDER — GEMFIBROZIL 600 MG PO TABS: 600.0000 mg | ORAL_TABLET | Freq: Two times a day (BID) | ORAL | 1 refills | Status: DC

## 2018-10-07 MED ORDER — HYDROXYZINE HCL 25 MG PO TABS: 25.0000 mg | ORAL_TABLET | Freq: Three times a day (TID) | ORAL | 1 refills | Status: DC | PRN

## 2018-10-07 MED ORDER — GABAPENTIN 100 MG PO CAPS: 100.0000 mg | ORAL_CAPSULE | Freq: Every day | ORAL | 3 refills | Status: DC

## 2018-10-07 MED ORDER — ANORO ELLIPTA 62.5-25 MCG/INH IN AEPB: 1.0000 | INHALATION_SPRAY | Freq: Every day | RESPIRATORY_TRACT | 12 refills | Status: DC

## 2018-10-07 MED ORDER — PREDNISONE 50 MG PO TABS: 50.0000 mg | ORAL_TABLET | Freq: Every day | ORAL | 0 refills | Status: DC

## 2018-10-07 MED ORDER — NICOTINE 14 MG/24HR TD PT24: 14.0000 mg | MEDICATED_PATCH | Freq: Every day | TRANSDERMAL | 3 refills | Status: DC

## 2018-10-07 MED ORDER — EZETIMIBE 10 MG PO TABS: 10.0000 mg | ORAL_TABLET | Freq: Every day | ORAL | 1 refills | Status: DC

## 2018-10-07 MED ORDER — CITALOPRAM HYDROBROMIDE 40 MG PO TABS: 40.0000 mg | ORAL_TABLET | Freq: Every day | ORAL | 3 refills | Status: DC

## 2018-10-07 MED ORDER — CLINDAMYCIN HCL 300 MG PO CAPS: 300.0000 mg | ORAL_CAPSULE | Freq: Three times a day (TID) | ORAL | 0 refills | Status: DC

## 2018-10-07 NOTE — Progress Notes (Signed)
BP (!) 151/98   Pulse 62   Temp 98.6 F (37 C) (Oral)   SpO2 98%    Subjective:    Patient ID: Brandi Acevedo, female    DOB: [birth year], 53 y.o.   MRN: [id number]  HPI: Brandi Acevedo is a 53 y.o. female  Chief Complaint  Patient presents with  . Dental Pain    right side of mouth, had a tooth pulled last month. Pain and swelling.    DENTAL PAIN Duration: 1.5 months Involved teeth: R side of her lower jaw since having a tooth pulled in June Dentist evaluation: no Mechanism of injury:  tooth extraction Onset: sudden Severity: severe Quality: aching Frequency: constant Radiation: into her jaw Aggravating factors: hard foods and chewing Alleviating factors: ice Status: stable Treatments attempted: ice, ibuprofen Relief with NSAIDs?: no Fevers: no Swelling: yes Redness: yes Paresthesias / decreased sensation: no Sinus pressure: no  DEPRESSION Mood status: stable Satisfied with current treatment?: yes Symptom severity: mild  Duration of current treatment : chronic Side effects: no Medication compliance: excellent compliance Psychotherapy/counseling: no  Previous psychiatric medications: celexa Depressed mood: no Anxious mood: no Anhedonia: no Significant weight loss or gain: no Insomnia: no  Fatigue: yes Feelings of worthlessness or guilt: no Impaired concentration/indecisiveness: no Suicidal ideations: no Hopelessness: no Crying spells: no Depression screen [facility] 2/9 07/05/2017 12/18/2016 11/18/2016 12/17/2015 06/04/2015  Decreased Interest 1 1 1 1 2   Down, Depressed, Hopeless 1 1 1  0 1  PHQ - 2 Score 2 2 2 1 3   Altered sleeping 3 2 1 2 2   Tired, decreased energy 2 2 2 1 1   Change in appetite 1 0 1 - 2  Feeling bad or failure about yourself  0 1 1 0 2  Trouble concentrating 0 1 0 1 1  Moving slowly or fidgety/restless 0 1 0 1 1  Suicidal thoughts 0 0 0 0 1  PHQ-9 Score 8 9 7 6 13   Difficult doing work/chores Somewhat difficult Somewhat difficult  Somewhat difficult - Very difficult   HYPERLIPIDEMIA Hyperlipidemia status: good compliance Satisfied with current treatment?  yes Side effects:  no Medication compliance: good compliance Past cholesterol meds:  Supplements: none Aspirin:  no The ASCVD Risk score Mikey Bussing DC Jr., et al., 2013) failed to calculate for the following reasons:   The patient has a prior MI or stroke diagnosis Chest pain:  no Coronary artery disease:  no  Impaired Fasting Glucose HbA1C:  Lab Results  Component Value Date   HGBA1C 6.3 10/07/2018   Duration of elevated blood sugar: chronic Polydipsia: no Polyuria: no Weight change: no Visual disturbance: no Glucose Monitoring: no Diabetic Education: Not Completed Family history of diabetes: yes   Relevant past medical, surgical, family and social history reviewed and updated as indicated. Interim medical history since our last visit reviewed. Allergies and medications reviewed and updated.  Review of Systems  Constitutional: Negative.   HENT: Positive for dental problem. Negative for congestion, drooling, ear discharge, ear pain, facial swelling, hearing loss, mouth sores, nosebleeds, postnasal drip, rhinorrhea, sinus pressure, sinus pain, sneezing, sore throat, tinnitus, trouble swallowing and voice change.   Respiratory: Negative.   Cardiovascular: Negative.   Musculoskeletal: Negative.   Neurological: Negative.   Psychiatric/Behavioral: Negative.     Per HPI unless specifically indicated above     Objective:    BP (!) 151/98   Pulse 62   Temp 98.6 F (37 C) (Oral)   SpO2 98%   Wt Readings  from Last 3 Encounters:  07/19/17 168 lb 1 oz (76.2 kg)  07/05/17 167 lb (75.8 kg)  01/22/17 175 lb 5 oz (79.5 kg)    Physical Exam Vitals signs and nursing note reviewed.  Constitutional:      General: She is not in acute distress.    Appearance: Normal appearance. She is not ill-appearing, toxic-appearing or diaphoretic.  HENT:     Head:  Normocephalic and atraumatic.     Right Ear: External ear normal.     Left Ear: External ear normal.     Nose: Nose normal.     Mouth/Throat:     Mouth: Mucous membranes are moist.     Pharynx: Oropharynx is clear.  Eyes:     General: No scleral icterus.       Right eye: No discharge.        Left eye: No discharge.     Extraocular Movements: Extraocular movements intact.     Conjunctiva/sclera: Conjunctivae normal.     Pupils: Pupils are equal, round, and reactive to light.  Neck:     Musculoskeletal: Normal range of motion and neck supple.  Cardiovascular:     Rate and Rhythm: Normal rate and regular rhythm.     Pulses: Normal pulses.     Heart sounds: Normal heart sounds. No murmur. No friction rub. No gallop.   Pulmonary:     Effort: Pulmonary effort is normal. No respiratory distress.     Breath sounds: Normal breath sounds. No stridor. No wheezing, rhonchi or rales.  Chest:     Chest wall: No tenderness.  Musculoskeletal: Normal range of motion.  Skin:    General: Skin is warm and dry.     Capillary Refill: Capillary refill takes less than 2 seconds.     Coloration: Skin is not jaundiced or pale.     Findings: No bruising, erythema, lesion or rash.  Neurological:     General: No focal deficit present.     Mental Status: She is alert and oriented to person, place, and time. Mental status is at baseline.  Psychiatric:        Mood and Affect: Mood normal.        Behavior: Behavior normal.        Thought Content: Thought content normal.        Judgment: Judgment normal.     Results for orders placed or performed in visit on 10/07/18  Bayer DCA Hb A1c Waived  Result Value Ref Range   HB A1C (BAYER DCA - WAIVED) 6.3 <7.0 %  CBC with Differential/Platelet  Result Value Ref Range   WBC 9.6 3.4 - 10.8 x10E3/uL   RBC 4.57 3.77 - 5.28 x10E6/uL   Hemoglobin 13.8 11.1 - 15.9 g/dL   Hematocrit 40.8 34.0 - 46.6 %   MCV 89 79 - 97 fL   MCH 30.2 26.6 - 33.0 pg   MCHC 33.8  31.5 - 35.7 g/dL   RDW 13.0 11.7 - 15.4 %   Platelets 335 150 - 450 x10E3/uL   Neutrophils 52 Not Estab. %   Lymphs 34 Not Estab. %   Monocytes 8 Not Estab. %   Eos 3 Not Estab. %   Basos 3 Not Estab. %   Neutrophils Absolute 5.1 1.4 - 7.0 x10E3/uL   Lymphocytes Absolute 3.2 (H) 0.7 - 3.1 x10E3/uL   Monocytes Absolute 0.7 0.1 - 0.9 x10E3/uL   EOS (ABSOLUTE) 0.3 0.0 - 0.4 x10E3/uL   Basophils Absolute 0.2 0.0 -  0.2 x10E3/uL   Immature Granulocytes 0 Not Estab. %   Immature Grans (Abs) 0.0 0.0 - 0.1 x10E3/uL  Comprehensive metabolic panel  Result Value Ref Range   Glucose 107 (H) 65 - 99 mg/dL   BUN 6 6 - 24 mg/dL   Creatinine, Ser 0.63 0.57 - 1.00 mg/dL   GFR calc non Af Amer 103 >59 mL/min/1.73   GFR calc Af Amer 119 >59 mL/min/1.73   BUN/Creatinine Ratio 10 9 - 23   Sodium 141 134 - 144 mmol/L   Potassium 4.6 3.5 - 5.2 mmol/L   Chloride 98 96 - 106 mmol/L   CO2 27 20 - 29 mmol/L   Calcium 9.3 8.7 - 10.2 mg/dL   Total Protein 7.2 6.0 - 8.5 g/dL   Albumin 4.5 3.8 - 4.9 g/dL   Globulin, Total 2.7 1.5 - 4.5 g/dL   Albumin/Globulin Ratio 1.7 1.2 - 2.2   Bilirubin Total 0.2 0.0 - 1.2 mg/dL   Alkaline Phosphatase 78 39 - 117 IU/L   AST 29 0 - 40 IU/L   ALT 40 (H) 0 - 32 IU/L  Lipid Panel w/o Chol/HDL Ratio  Result Value Ref Range   Cholesterol, Total 211 (H) 100 - 199 mg/dL   Triglycerides 345 (H) 0 - 149 mg/dL   HDL 32 (L) >39 mg/dL   VLDL Cholesterol Cal 69 (H) 5 - 40 mg/dL   LDL Calculated 110 (H) 0 - 99 mg/dL  TSH  Result Value Ref Range   TSH 3.570 0.450 - 4.500 uIU/mL  UA/M w/rflx Culture, Routine   Specimen: Urine   URINE  Result Value Ref Range   Specific Gravity, UA 1.010 1.005 - 1.030   pH, UA 6.0 5.0 - 7.5   Color, UA Yellow Yellow   Appearance Ur Clear Clear   Leukocytes,UA Negative Negative   Protein,UA Negative Negative/Trace   Glucose, UA Negative Negative   Ketones, UA Negative Negative   RBC, UA Negative Negative   Bilirubin, UA Negative Negative    Urobilinogen, Ur 0.2 0.2 - 1.0 mg/dL   Nitrite, UA Negative Negative  Ferritin  Result Value Ref Range   Ferritin 278 (H) 15 - 150 ng/mL      Assessment & Plan:   Problem List Items Addressed This Visit      Respiratory   Chronic obstructive pulmonary disease (COPD) ([place])    Under good control on current regimen. Continue current regimen. Continue to monitor. Call with any concerns. Refills given. Labs drawn today.       Relevant Medications   umeclidinium-vilanterol (ANORO ELLIPTA) 62.5-25 MCG/INH AEPB   predniSONE (DELTASONE) 50 MG tablet   nicotine (NICODERM CQ) 14 mg/24hr patch   Other Relevant Orders   CBC with Differential/Platelet (Completed)   Comprehensive metabolic panel (Completed)   TSH (Completed)   UA/M w/rflx Culture, Routine (Completed)     Endocrine   IFG (impaired fasting glucose)    Under good control on current diet. Continue to monitor. Call with any concerns. Labs drawn today.       Relevant Orders   Bayer DCA Hb A1c Waived (Completed)   Comprehensive metabolic panel (Completed)   TSH (Completed)   UA/M w/rflx Culture, Routine (Completed)     Other   RLS (restless legs syndrome)    Under good control on current regimen. Continue current regimen. Continue to monitor. Call with any concerns. Refills given. Labs drawn today.       Relevant Orders   Comprehensive metabolic panel (  Completed)   TSH (Completed)   UA/M w/rflx Culture, Routine (Completed)   Depression, major, single episode, severe ([place])    Under good control on current regimen. Continue current regimen. Continue to monitor. Call with any concerns. Refills given. Labs drawn today.       Relevant Medications   citalopram (CELEXA) 40 MG tablet   hydrOXYzine (ATARAX/VISTARIL) 25 MG tablet   Other Relevant Orders   Comprehensive metabolic panel (Completed)   UA/M w/rflx Culture, Routine (Completed)   Hyperlipidemia    Under good control on current regimen. Continue current  regimen. Continue to monitor. Call with any concerns. Refills given. Labs drawn today.       Relevant Medications   ezetimibe (ZETIA) 10 MG tablet   gemfibrozil (LOPID) 600 MG tablet   Other Relevant Orders   Comprehensive metabolic panel (Completed)   Lipid Panel w/o Chol/HDL Ratio (Completed)   UA/M w/rflx Culture, Routine (Completed)   Elevated ferritin    Labs drawn today. Await results.        Relevant Orders   Comprehensive metabolic panel (Completed)   UA/M w/rflx Culture, Routine (Completed)   Ferritin (Completed)    Other Visit Diagnoses    Pain, dental    -  Primary   Will treat with antibiotics. Call dentist. Call wiht any concerns continue to monitor.        Follow up plan: Return in about 6 months (around 04/09/2019).

## 2018-10-08 LAB — FERRITIN: Ferritin: 278 ng/mL — ABNORMAL HIGH (ref 15–150)

## 2018-10-08 LAB — CBC WITH DIFFERENTIAL/PLATELET
Basophils Absolute: 0.2 10*3/uL (ref 0.0–0.2)
Basos: 3 %
EOS (ABSOLUTE): 0.3 10*3/uL (ref 0.0–0.4)
Eos: 3 %
Hematocrit: 40.8 % (ref 34.0–46.6)
Hemoglobin: 13.8 g/dL (ref 11.1–15.9)
Immature Grans (Abs): 0 10*3/uL (ref 0.0–0.1)
Immature Granulocytes: 0 %
Lymphocytes Absolute: 3.2 10*3/uL — ABNORMAL HIGH (ref 0.7–3.1)
Lymphs: 34 %
MCH: 30.2 pg (ref 26.6–33.0)
MCHC: 33.8 g/dL (ref 31.5–35.7)
MCV: 89 fL (ref 79–97)
Monocytes Absolute: 0.7 10*3/uL (ref 0.1–0.9)
Monocytes: 8 %
Neutrophils Absolute: 5.1 10*3/uL (ref 1.4–7.0)
Neutrophils: 52 %
Platelets: 335 10*3/uL (ref 150–450)
RBC: 4.57 x10E6/uL (ref 3.77–5.28)
RDW: 13 % (ref 11.7–15.4)
WBC: 9.6 10*3/uL (ref 3.4–10.8)

## 2018-10-08 LAB — COMPREHENSIVE METABOLIC PANEL
ALT: 40 IU/L — ABNORMAL HIGH (ref 0–32)
AST: 29 IU/L (ref 0–40)
Albumin/Globulin Ratio: 1.7 (ref 1.2–2.2)
Albumin: 4.5 g/dL (ref 3.8–4.9)
Alkaline Phosphatase: 78 IU/L (ref 39–117)
BUN/Creatinine Ratio: 10 (ref 9–23)
BUN: 6 mg/dL (ref 6–24)
Bilirubin Total: 0.2 mg/dL (ref 0.0–1.2)
CO2: 27 mmol/L (ref 20–29)
Calcium: 9.3 mg/dL (ref 8.7–10.2)
Chloride: 98 mmol/L (ref 96–106)
Creatinine, Ser: 0.63 mg/dL (ref 0.57–1.00)
GFR calc Af Amer: 119 mL/min/{1.73_m2} (ref >59)
GFR calc non Af Amer: 103 mL/min/{1.73_m2} (ref >59)
Globulin, Total: 2.7 g/dL (ref 1.5–4.5)
Glucose: 107 mg/dL — ABNORMAL HIGH (ref 65–99)
Potassium: 4.6 mmol/L (ref 3.5–5.2)
Sodium: 141 mmol/L (ref 134–144)
Total Protein: 7.2 g/dL (ref 6.0–8.5)

## 2018-10-08 LAB — LIPID PANEL W/O CHOL/HDL RATIO
Cholesterol, Total: 211 mg/dL — ABNORMAL HIGH (ref 100–199)
HDL: 32 mg/dL — ABNORMAL LOW (ref >39)
LDL Calculated: 110 mg/dL — ABNORMAL HIGH (ref 0–99)
Triglycerides: 345 mg/dL — ABNORMAL HIGH (ref 0–149)
VLDL Cholesterol Cal: 69 mg/dL — ABNORMAL HIGH (ref 5–40)

## 2018-10-08 LAB — BAYER DCA HB A1C WAIVED: HB A1C (BAYER DCA - WAIVED): 6.3 % (ref <7.0)

## 2018-10-08 LAB — UA/M W/RFLX CULTURE, ROUTINE
Bilirubin, UA: NEGATIVE
Glucose, UA: NEGATIVE
Ketones, UA: NEGATIVE
Leukocytes,UA: NEGATIVE
Nitrite, UA: NEGATIVE
Protein,UA: NEGATIVE
RBC, UA: NEGATIVE
Specific Gravity, UA: 1.01 (ref 1.005–1.030)
Urobilinogen, Ur: 0.2 mg/dL (ref 0.2–1.0)
pH, UA: 6 (ref 5.0–7.5)

## 2018-10-08 LAB — TSH: TSH: 3.57 u[IU]/mL (ref 0.450–4.500)

## 2018-10-10 ENCOUNTER — Encounter: Payer: Self-pay | Admitting: Family Medicine

## 2018-10-10 NOTE — Assessment & Plan Note (Signed)
Under good control on current regimen. Continue current regimen. Continue to monitor. Call with any concerns. Refills given. Labs drawn today.   

## 2018-10-10 NOTE — Assessment & Plan Note (Signed)
Labs drawn today. Await results.  

## 2018-10-10 NOTE — Assessment & Plan Note (Signed)
Under good control on current diet. Continue to monitor. Call with any concerns. Labs drawn today.

## 2018-10-21 ENCOUNTER — Other Ambulatory Visit: Payer: Self-pay | Admitting: Family Medicine

## 2018-11-07 ENCOUNTER — Encounter: Payer: Self-pay | Admitting: Family Medicine

## 2018-11-07 ENCOUNTER — Ambulatory Visit: Payer: BC Managed Care – PPO | Admitting: Family Medicine

## 2018-11-07 ENCOUNTER — Other Ambulatory Visit: Payer: Self-pay

## 2018-11-07 VITALS — BP 120/85 | HR 62 | Temp 99.3°F | Ht 61.0 in | Wt 178.0 lb

## 2018-11-07 DIAGNOSIS — G8929 Other chronic pain: Secondary | ICD-10-CM

## 2018-11-07 DIAGNOSIS — M79672 Pain in left foot: Secondary | ICD-10-CM

## 2018-11-07 MED ORDER — DICLOFENAC SODIUM 1 % TD GEL: 4.0000 g | Freq: Four times a day (QID) | TRANSDERMAL | 3 refills | Status: DC

## 2018-11-07 NOTE — Progress Notes (Signed)
BP 120/85   Pulse 62   Temp 99.3 F (37.4 C) (Oral)   Ht 5\' 1"  (1.549 m)   Wt 178 lb (80.7 kg)   SpO2 94%   BMI 33.63 kg/m    Subjective:    Patient ID: Brandi Acevedo, female    DOB: 1965/11/09, 53 y.o.   MRN: [id number]  HPI: Brandi Acevedo is a 53 y.o. female  Chief Complaint  Patient presents with  . Foot Pain    podiatris   FOOT PAIN Duration: chronic Involved foot: left Mechanism of injury: unknown Location: heel pain Onset: sudden  Severity: severe  Quality:  Sharp and aching Frequency: constant Radiation: no Aggravating factors: weight bearing and walking  Alleviating factors: nothing  Status: worse Treatments attempted: rest, ice, heat, APAP and ibuprofen  Relief with NSAIDs?:  mild Weakness with weight bearing or walking: yes Morning stiffness: yes Swelling: yes Redness: no Bruising: no Paresthesias / decreased sensation: no  Fevers:no  Relevant past medical, surgical, family and social history reviewed and updated as indicated. Interim medical history since our last visit reviewed. Allergies and medications reviewed and updated.  Review of Systems  Constitutional: Negative.   Respiratory: Negative.   Cardiovascular: Negative.   Musculoskeletal: Positive for arthralgias and gait problem. Negative for back pain, joint swelling, myalgias, neck pain and neck stiffness.  Skin: Negative.   Psychiatric/Behavioral: Negative.     Per HPI unless specifically indicated above     Objective:    BP 120/85   Pulse 62   Temp 99.3 F (37.4 C) (Oral)   Ht 5\' 1"  (1.549 m)   Wt 178 lb (80.7 kg)   SpO2 94%   BMI 33.63 kg/m   Wt Readings from Last 3 Encounters:  11/07/18 178 lb (80.7 kg)  07/19/17 168 lb 1 oz (76.2 kg)  07/05/17 167 lb (75.8 kg)    Physical Exam Vitals signs and nursing note reviewed.  Constitutional:      General: She is not in acute distress.    Appearance: Normal appearance. She is not ill-appearing, toxic-appearing or  diaphoretic.  HENT:     Head: Normocephalic and atraumatic.     Right Ear: External ear normal.     Left Ear: External ear normal.     Nose: Nose normal.     Mouth/Throat:     Mouth: Mucous membranes are moist.     Pharynx: Oropharynx is clear.  Eyes:     General: No scleral icterus.       Right eye: No discharge.        Left eye: No discharge.     Extraocular Movements: Extraocular movements intact.     Conjunctiva/sclera: Conjunctivae normal.     Pupils: Pupils are equal, round, and reactive to light.  Neck:     Musculoskeletal: Normal range of motion and neck supple.  Cardiovascular:     Rate and Rhythm: Normal rate and regular rhythm.     Pulses: Normal pulses.     Heart sounds: Normal heart sounds. No murmur. No friction rub. No gallop.   Pulmonary:     Effort: Pulmonary effort is normal. No respiratory distress.     Breath sounds: Normal breath sounds. No stridor. No wheezing, rhonchi or rales.  Chest:     Chest wall: No tenderness.  Musculoskeletal: Normal range of motion.  Skin:    General: Skin is warm and dry.     Capillary Refill: Capillary refill takes less than 2 seconds.  Coloration: Skin is not jaundiced or pale.     Findings: No bruising, erythema, lesion or rash.  Neurological:     General: No focal deficit present.     Mental Status: She is alert and oriented to person, place, and time. Mental status is at baseline.  Psychiatric:        Mood and Affect: Mood normal.        Behavior: Behavior normal.        Thought Content: Thought content normal.        Judgment: Judgment normal.     Results for orders placed or performed in visit on 10/07/18  Bayer DCA Hb A1c Waived  Result Value Ref Range   HB A1C (BAYER DCA - WAIVED) 6.3 <7.0 %  CBC with Differential/Platelet  Result Value Ref Range   WBC 9.6 3.4 - 10.8 x10E3/uL   RBC 4.57 3.77 - 5.28 x10E6/uL   Hemoglobin 13.8 11.1 - 15.9 g/dL   Hematocrit 40.8 34.0 - 46.6 %   MCV 89 79 - 97 fL   MCH  30.2 26.6 - 33.0 pg   MCHC 33.8 31.5 - 35.7 g/dL   RDW 13.0 11.7 - 15.4 %   Platelets 335 150 - 450 x10E3/uL   Neutrophils 52 Not Estab. %   Lymphs 34 Not Estab. %   Monocytes 8 Not Estab. %   Eos 3 Not Estab. %   Basos 3 Not Estab. %   Neutrophils Absolute 5.1 1.4 - 7.0 x10E3/uL   Lymphocytes Absolute 3.2 (H) 0.7 - 3.1 x10E3/uL   Monocytes Absolute 0.7 0.1 - 0.9 x10E3/uL   EOS (ABSOLUTE) 0.3 0.0 - 0.4 x10E3/uL   Basophils Absolute 0.2 0.0 - 0.2 x10E3/uL   Immature Granulocytes 0 Not Estab. %   Immature Grans (Abs) 0.0 0.0 - 0.1 x10E3/uL  Comprehensive metabolic panel  Result Value Ref Range   Glucose 107 (H) 65 - 99 mg/dL   BUN 6 6 - 24 mg/dL   Creatinine, Ser 0.63 0.57 - 1.00 mg/dL   GFR calc non Af Amer 103 >59 mL/min/1.73   GFR calc Af Amer 119 >59 mL/min/1.73   BUN/Creatinine Ratio 10 9 - 23   Sodium 141 134 - 144 mmol/L   Potassium 4.6 3.5 - 5.2 mmol/L   Chloride 98 96 - 106 mmol/L   CO2 27 20 - 29 mmol/L   Calcium 9.3 8.7 - 10.2 mg/dL   Total Protein 7.2 6.0 - 8.5 g/dL   Albumin 4.5 3.8 - 4.9 g/dL   Globulin, Total 2.7 1.5 - 4.5 g/dL   Albumin/Globulin Ratio 1.7 1.2 - 2.2   Bilirubin Total 0.2 0.0 - 1.2 mg/dL   Alkaline Phosphatase 78 39 - 117 IU/L   AST 29 0 - 40 IU/L   ALT 40 (H) 0 - 32 IU/L  Lipid Panel w/o Chol/HDL Ratio  Result Value Ref Range   Cholesterol, Total 211 (H) 100 - 199 mg/dL   Triglycerides 345 (H) 0 - 149 mg/dL   HDL 32 (L) >39 mg/dL   VLDL Cholesterol Cal 69 (H) 5 - 40 mg/dL   LDL Calculated 110 (H) 0 - 99 mg/dL  TSH  Result Value Ref Range   TSH 3.570 0.450 - 4.500 uIU/mL  UA/M w/rflx Culture, Routine   Specimen: Urine   URINE  Result Value Ref Range   Specific Gravity, UA 1.010 1.005 - 1.030   pH, UA 6.0 5.0 - 7.5   Color, UA Yellow Yellow   Appearance  Ur Clear Clear   Leukocytes,UA Negative Negative   Protein,UA Negative Negative/Trace   Glucose, UA Negative Negative   Ketones, UA Negative Negative   RBC, UA Negative Negative    Bilirubin, UA Negative Negative   Urobilinogen, Ur 0.2 0.2 - 1.0 mg/dL   Nitrite, UA Negative Negative  Ferritin  Result Value Ref Range   Ferritin 278 (H) 15 - 150 ng/mL      Assessment & Plan:   Problem List Items Addressed This Visit    None    Visit Diagnoses    Chronic heel pain, left    -  Primary   Will get her into see podiatry. Call with any concerns. Continue to monitor.    Relevant Orders   Ambulatory referral to Podiatry       Follow up plan: Return in about 5 months (around 04/09/2019).

## 2018-12-08 ENCOUNTER — Telehealth: Payer: Self-pay

## 2018-12-08 NOTE — Telephone Encounter (Signed)
Fax from [facility] Rx refill request on Ropinirole 1 mg tablet

## 2018-12-13 ENCOUNTER — Telehealth: Payer: BLUE CROSS/BLUE SHIELD | Admitting: Physician Assistant

## 2018-12-13 DIAGNOSIS — J309 Allergic rhinitis, unspecified: Secondary | ICD-10-CM

## 2018-12-13 NOTE — Telephone Encounter (Signed)
Patient is taking 2 mg of requip along with 1 mg requip. 6 month supply were sent in for the 2 mg and seems like patient needs a refill on the 1 mg Please advise

## 2018-12-13 NOTE — Telephone Encounter (Signed)
Pt following up on refill request for   rOPINIRole (REQUIP) 1 MG tablet    Pt states she is used to taking both 2 mg and 1mg   Pt states she took 2 of th e2 mg last night just to go to sleep.  Pt also would like something for her sinus issue.  Pt did an evisit today, and was not prescribed anything. Pt would like to know what she could use?  [facility] ([facility] [place] [facility] [place] [phone] (Phone) [phone] (Fax)

## 2018-12-13 NOTE — Progress Notes (Signed)
Hi Brandi Acevedo,  I am sorry you are not feeling well.  Based on what you shared with me regarding your fever/chills and shortness of breath, I feel your condition warrants further evaluation and I recommend that you be seen for a face to face office visit.    NOTE: If you entered your credit card information for this eVisit, you will not be charged. You may see a "hold" on your card for the $35 but that hold will drop off and you will not have a charge processed.  If you are having a true medical emergency please call 911.     For an urgent face to face visit, [facility] has four urgent care centers for your convenience:   . [facility]    [phone]                  Get Driving Directions  [phone] [place], [place] [address] . 10 am to 8 pm Monday-Friday . 12 pm to 8 pm Saturday-Sunday   . [facility] at [place]                  Get Driving Directions  [phone] [place], [place], [place] [address] . 8 am to 8 pm Monday-Friday . 9 am to 6 pm Saturday . 11 am to 6 pm Sunday   . [facility] at [place]                  Get Driving Directions   [address], [place] [address] . 8 am to 8 pm Monday-Friday . 8 am to 4 pm Saturday-Sunday    . [facility] at [place]                    Get Driving Directions  [id number]  [address] [place],  [address]  . Monday-Friday, 12 PM to 6 PM    Your e-visit answers were reviewed by a board certified advanced clinical practitioner to complete your personal care plan.  Thank you for using e-Visits.

## 2018-12-21 MED ORDER — ROPINIROLE HCL 1 MG PO TABS: 1.0000 mg | ORAL_TABLET | Freq: Every day | ORAL | 1 refills | Status: DC

## 2018-12-21 NOTE — Addendum Note (Signed)
Addended by: Valerie Roys on: 12/21/2018 01:33 PM   Modules accepted: Orders

## 2018-12-30 ENCOUNTER — Telehealth: Payer: Self-pay

## 2018-12-30 MED ORDER — FLUTICASONE PROPIONATE 50 MCG/ACT NA SUSP: 2.0000 | Freq: Every day | NASAL | 6 refills | Status: DC

## 2018-12-30 NOTE — Telephone Encounter (Signed)
Patient would like a prescription for fluticasone 74mcg

## 2019-01-02 ENCOUNTER — Other Ambulatory Visit: Payer: Self-pay

## 2019-01-02 ENCOUNTER — Encounter: Payer: Self-pay | Admitting: Family Medicine

## 2019-01-02 ENCOUNTER — Ambulatory Visit (INDEPENDENT_AMBULATORY_CARE_PROVIDER_SITE_OTHER): Payer: BC Managed Care – PPO | Admitting: Family Medicine

## 2019-01-02 VITALS — Ht 61.0 in | Wt 180.0 lb

## 2019-01-02 DIAGNOSIS — J069 Acute upper respiratory infection, unspecified: Secondary | ICD-10-CM

## 2019-01-02 DIAGNOSIS — J441 Chronic obstructive pulmonary disease with (acute) exacerbation: Secondary | ICD-10-CM

## 2019-01-02 MED ORDER — AZITHROMYCIN 250 MG PO TABS: ORAL_TABLET | ORAL | 0 refills | Status: DC

## 2019-01-02 MED ORDER — PREDNISONE 50 MG PO TABS: 50.0000 mg | ORAL_TABLET | Freq: Every day | ORAL | 0 refills | Status: DC

## 2019-01-02 NOTE — Progress Notes (Signed)
Ht 5\' 1"  (1.549 m)   Wt 180 lb (81.6 kg)   BMI 34.01 kg/m    Subjective:    Patient ID: Brandi Acevedo, female    DOB: 04-21-1965, 53 y.o.   MRN: [id number]  HPI: Brandi Acevedo is a 53 y.o. female  Chief Complaint  Patient presents with  . Cough    x about 5 days. loss of appetite. pt requesting a covid 19 test  . Nasal Congestion  . Headache   UPPER RESPIRATORY TRACT INFECTION Duration: 5 days Worst symptom: congestion and cough Fever: no Cough: yes Shortness of breath: yes Wheezing: yes Chest pain: yes Chest tightness: no Chest congestion: yes Nasal congestion: yes Runny nose: yes Post nasal drip: yes Sneezing: yes Sore throat: no Swollen glands: no Sinus pressure: yes Headache: yes Face pain: no Toothache: no Ear pain: yes "right Ear pressure: yes "right Eyes red/itching:no Eye drainage/crusting: no  Vomiting: no Rash: no Fatigue: yes Sick contacts: yes Strep contacts: no  Context: worse Recurrent sinusitis: no Relief with OTC cold/cough medications: no  Treatments attempted: anti-histamine   Relevant past medical, surgical, family and social history reviewed and updated as indicated. Interim medical history since our last visit reviewed. Allergies and medications reviewed and updated.  Review of Systems  Constitutional: Positive for activity change, appetite change and fatigue. Negative for chills, diaphoresis, fever and unexpected weight change.  HENT: Positive for congestion, postnasal drip, rhinorrhea, sinus pressure, sinus pain and sneezing. Negative for dental problem, drooling, ear discharge, ear pain, facial swelling, hearing loss, mouth sores, nosebleeds, sore throat, tinnitus, trouble swallowing and voice change.   Eyes: Negative.   Respiratory: Positive for cough, shortness of breath and wheezing. Negative for apnea, choking, chest tightness and stridor.   Cardiovascular: Negative.   Neurological: Negative.    Psychiatric/Behavioral: Negative.     Per HPI unless specifically indicated above     Objective:    Ht 5\' 1"  (1.549 m)   Wt 180 lb (81.6 kg)   BMI 34.01 kg/m   Wt Readings from Last 3 Encounters:  01/02/19 180 lb (81.6 kg)  11/07/18 178 lb (80.7 kg)  07/19/17 168 lb 1 oz (76.2 kg)    Physical Exam Vitals signs and nursing note reviewed.  Constitutional:      General: She is not in acute distress.    Appearance: Normal appearance. She is ill-appearing. She is not toxic-appearing or diaphoretic.  HENT:     Head: Normocephalic and atraumatic.     Right Ear: External ear normal.     Left Ear: External ear normal.     Nose: Nose normal.     Mouth/Throat:     Mouth: Mucous membranes are moist.     Pharynx: Oropharynx is clear.  Eyes:     General: No scleral icterus.       Right eye: No discharge.        Left eye: No discharge.     Conjunctiva/sclera: Conjunctivae normal.     Pupils: Pupils are equal, round, and reactive to light.  Neck:     Musculoskeletal: Normal range of motion.  Pulmonary:     Effort: Pulmonary effort is normal. No respiratory distress.     Comments: Speaking in full sentences Musculoskeletal: Normal range of motion.  Skin:    Coloration: Skin is not jaundiced or pale.     Findings: No bruising, erythema, lesion or rash.  Neurological:     Mental Status: She is alert and oriented  to person, place, and time. Mental status is at baseline.  Psychiatric:        Mood and Affect: Mood normal.        Behavior: Behavior normal.        Thought Content: Thought content normal.        Judgment: Judgment normal.     Results for orders placed or performed in visit on 10/07/18  Bayer DCA Hb A1c Waived  Result Value Ref Range   HB A1C (BAYER DCA - WAIVED) 6.3 <7.0 %  CBC with Differential/Platelet  Result Value Ref Range   WBC 9.6 3.4 - 10.8 x10E3/uL   RBC 4.57 3.77 - 5.28 x10E6/uL   Hemoglobin 13.8 11.1 - 15.9 g/dL   Hematocrit 40.8 34.0 - 46.6 %    MCV 89 79 - 97 fL   MCH 30.2 26.6 - 33.0 pg   MCHC 33.8 31.5 - 35.7 g/dL   RDW 13.0 11.7 - 15.4 %   Platelets 335 150 - 450 x10E3/uL   Neutrophils 52 Not Estab. %   Lymphs 34 Not Estab. %   Monocytes 8 Not Estab. %   Eos 3 Not Estab. %   Basos 3 Not Estab. %   Neutrophils Absolute 5.1 1.4 - 7.0 x10E3/uL   Lymphocytes Absolute 3.2 (H) 0.7 - 3.1 x10E3/uL   Monocytes Absolute 0.7 0.1 - 0.9 x10E3/uL   EOS (ABSOLUTE) 0.3 0.0 - 0.4 x10E3/uL   Basophils Absolute 0.2 0.0 - 0.2 x10E3/uL   Immature Granulocytes 0 Not Estab. %   Immature Grans (Abs) 0.0 0.0 - 0.1 x10E3/uL  Comprehensive metabolic panel  Result Value Ref Range   Glucose 107 (H) 65 - 99 mg/dL   BUN 6 6 - 24 mg/dL   Creatinine, Ser 0.63 0.57 - 1.00 mg/dL   GFR calc non Af Amer 103 >59 mL/min/1.73   GFR calc Af Amer 119 >59 mL/min/1.73   BUN/Creatinine Ratio 10 9 - 23   Sodium 141 134 - 144 mmol/L   Potassium 4.6 3.5 - 5.2 mmol/L   Chloride 98 96 - 106 mmol/L   CO2 27 20 - 29 mmol/L   Calcium 9.3 8.7 - 10.2 mg/dL   Total Protein 7.2 6.0 - 8.5 g/dL   Albumin 4.5 3.8 - 4.9 g/dL   Globulin, Total 2.7 1.5 - 4.5 g/dL   Albumin/Globulin Ratio 1.7 1.2 - 2.2   Bilirubin Total 0.2 0.0 - 1.2 mg/dL   Alkaline Phosphatase 78 39 - 117 IU/L   AST 29 0 - 40 IU/L   ALT 40 (H) 0 - 32 IU/L  Lipid Panel w/o Chol/HDL Ratio  Result Value Ref Range   Cholesterol, Total 211 (H) 100 - 199 mg/dL   Triglycerides 345 (H) 0 - 149 mg/dL   HDL 32 (L) >39 mg/dL   VLDL Cholesterol Cal 69 (H) 5 - 40 mg/dL   LDL Calculated 110 (H) 0 - 99 mg/dL  TSH  Result Value Ref Range   TSH 3.570 0.450 - 4.500 uIU/mL  UA/M w/rflx Culture, Routine   Specimen: Urine   URINE  Result Value Ref Range   Specific Gravity, UA 1.010 1.005 - 1.030   pH, UA 6.0 5.0 - 7.5   Color, UA Yellow Yellow   Appearance Ur Clear Clear   Leukocytes,UA Negative Negative   Protein,UA Negative Negative/Trace   Glucose, UA Negative Negative   Ketones, UA Negative Negative    RBC, UA Negative Negative   Bilirubin, UA Negative Negative  Urobilinogen, Ur 0.2 0.2 - 1.0 mg/dL   Nitrite, UA Negative Negative  Ferritin  Result Value Ref Range   Ferritin 278 (H) 15 - 150 ng/mL      Assessment & Plan:   Problem List Items Addressed This Visit    None    Visit Diagnoses    COPD exacerbation ([place])    -  Primary   Will treat with prednisone burst and azithromycin. Call with any concerns. Continue to monitor. Call with any concerns.    Relevant Medications   predniSONE (DELTASONE) 50 MG tablet   azithromycin (ZITHROMAX) 250 MG tablet   Upper respiratory tract infection, unspecified type       Will check COVID screening. Self-quarantine until results back. Continue rest and fluids. Call with any concerns.    Relevant Medications   azithromycin (ZITHROMAX) 250 MG tablet   Other Relevant Orders   Novel Coronavirus, NAA ([facility])       Follow up plan: Return if symptoms worsen or fail to improve.   . This visit was completed via D[employer]imity due to the restrictions of the COVID-19 pandemic. All issues as above were discussed and addressed. Physical exam was done as above through visual confirmation on Doximity. If it was felt that the patient should be evaluated in the office, they were directed there. The patient verbally consented to this visit. . Location of the patient: home . Location of the provider: work . Those involved with this call:  . Provider: Park Liter, DO . CMA: Lesle Chris, [place] . Front Desk/Registration: Don Perking  . Time spent on call: 15 minutes with patient face to face via video conference. More than 50% of this time was spent in counseling and coordination of care. 23 minutes total spent in review of patient's record and preparation of their chart.

## 2019-02-17 ENCOUNTER — Ambulatory Visit (INDEPENDENT_AMBULATORY_CARE_PROVIDER_SITE_OTHER): Payer: BC Managed Care – PPO | Admitting: Family Medicine

## 2019-02-17 ENCOUNTER — Other Ambulatory Visit: Payer: Self-pay

## 2019-02-17 ENCOUNTER — Encounter: Payer: Self-pay | Admitting: Family Medicine

## 2019-02-17 VITALS — BP 129/75 | HR 60 | Temp 98.0°F

## 2019-02-17 DIAGNOSIS — Z01818 Encounter for other preprocedural examination: Secondary | ICD-10-CM

## 2019-02-17 LAB — COAGUCHEK XS/INR WAIVED
INR: 1 (ref 0.9–1.1)
Prothrombin Time: 12.3 s

## 2019-02-17 NOTE — Progress Notes (Signed)
Seen today for surgical clearance for achilles repair scheduled for 03/22/18- she is not within 30 days of surgery. Reschedule for next week.

## 2019-02-18 LAB — COMPREHENSIVE METABOLIC PANEL
ALT: 67 IU/L — ABNORMAL HIGH (ref 0–32)
AST: 40 IU/L (ref 0–40)
Albumin/Globulin Ratio: 1.8 (ref 1.2–2.2)
Albumin: 4.6 g/dL (ref 3.8–4.9)
Alkaline Phosphatase: 66 IU/L (ref 39–117)
BUN/Creatinine Ratio: 14 (ref 9–23)
BUN: 10 mg/dL (ref 6–24)
Bilirubin Total: 0.3 mg/dL (ref 0.0–1.2)
CO2: 26 mmol/L (ref 20–29)
Calcium: 9.5 mg/dL (ref 8.7–10.2)
Chloride: 99 mmol/L (ref 96–106)
Creatinine, Ser: 0.74 mg/dL (ref 0.57–1.00)
GFR calc Af Amer: 107 mL/min/{1.73_m2} (ref >59)
GFR calc non Af Amer: 93 mL/min/{1.73_m2} (ref >59)
Globulin, Total: 2.6 g/dL (ref 1.5–4.5)
Glucose: 117 mg/dL — ABNORMAL HIGH (ref 65–99)
Potassium: 4.2 mmol/L (ref 3.5–5.2)
Sodium: 137 mmol/L (ref 134–144)
Total Protein: 7.2 g/dL (ref 6.0–8.5)

## 2019-02-18 LAB — CBC WITH DIFFERENTIAL/PLATELET
Basophils Absolute: 0.2 10*3/uL (ref 0.0–0.2)
Basos: 2 %
EOS (ABSOLUTE): 0.3 10*3/uL (ref 0.0–0.4)
Eos: 4 %
Hematocrit: 40.4 % (ref 34.0–46.6)
Hemoglobin: 13.3 g/dL (ref 11.1–15.9)
Immature Grans (Abs): 0 10*3/uL (ref 0.0–0.1)
Immature Granulocytes: 0 %
Lymphocytes Absolute: 3.2 10*3/uL — ABNORMAL HIGH (ref 0.7–3.1)
Lymphs: 41 %
MCH: 29.4 pg (ref 26.6–33.0)
MCHC: 32.9 g/dL (ref 31.5–35.7)
MCV: 89 fL (ref 79–97)
Monocytes Absolute: 0.5 10*3/uL (ref 0.1–0.9)
Monocytes: 6 %
Neutrophils Absolute: 3.7 10*3/uL (ref 1.4–7.0)
Neutrophils: 47 %
Platelets: 284 10*3/uL (ref 150–450)
RBC: 4.52 x10E6/uL (ref 3.77–5.28)
RDW: 13 % (ref 11.7–15.4)
WBC: 7.8 10*3/uL (ref 3.4–10.8)

## 2019-02-21 ENCOUNTER — Ambulatory Visit (INDEPENDENT_AMBULATORY_CARE_PROVIDER_SITE_OTHER): Payer: BC Managed Care – PPO | Admitting: Family Medicine

## 2019-02-21 ENCOUNTER — Other Ambulatory Visit: Payer: Self-pay

## 2019-02-21 ENCOUNTER — Encounter: Payer: Self-pay | Admitting: Family Medicine

## 2019-02-21 VITALS — BP 137/81 | HR 66 | Temp 98.8°F

## 2019-02-21 DIAGNOSIS — R3915 Urgency of urination: Secondary | ICD-10-CM

## 2019-02-21 DIAGNOSIS — Z01818 Encounter for other preprocedural examination: Secondary | ICD-10-CM | POA: Diagnosis not present

## 2019-02-21 LAB — UA/M W/RFLX CULTURE, ROUTINE
Bilirubin, UA: NEGATIVE
Glucose, UA: NEGATIVE
Ketones, UA: NEGATIVE
Leukocytes,UA: NEGATIVE
Nitrite, UA: NEGATIVE
Protein,UA: NEGATIVE
RBC, UA: NEGATIVE
Specific Gravity, UA: >1.03 — ABNORMAL HIGH (ref 1.005–1.030)
Urobilinogen, Ur: 0.2 mg/dL (ref 0.2–1.0)
pH, UA: 5.5 (ref 5.0–7.5)

## 2019-02-21 NOTE — Patient Instructions (Signed)
Urinary Incontinence  Urinary incontinence refers to a condition in which a person is unable to control where and when to pass urine. A person with this condition will urinate when he or she does not mean to (involuntarily). What are the causes? This condition may be caused by:  Medicines.  Infections.  Constipation.  Overactive bladder muscles.  Weak bladder muscles.  Weak pelvic floor muscles. These muscles provide support for the bladder, intestine, and, in women, the uterus.  Enlarged prostate in men. The prostate is a gland near the bladder. When it gets too big, it can pinch the urethra. With the urethra blocked, the bladder can weaken and lose the ability to empty properly.  Surgery.  Emotional factors, such as anxiety, stress, or post-traumatic stress disorder (PTSD).  Pelvic organ prolapse. This happens in women when organs shift out of place and into the vagina. This shift can prevent the bladder and urethra from working properly. What increases the risk? The following factors may make you more likely to develop this condition:  Older age.  Obesity and physical inactivity.  Pregnancy and childbirth.  Menopause.  Diseases that affect the nerves or spinal cord (neurological diseases).  Long-term (chronic) coughing. This can increase pressure on the bladder and pelvic floor muscles. What are the signs or symptoms? Symptoms may vary depending on the type of urinary incontinence you have. They include:  A sudden urge to urinate, but passing urine involuntarily before you can get to a bathroom (urge incontinence).  Suddenly passing urine with any activity that forces urine to pass, such as coughing, laughing, exercise, or sneezing (stress incontinence).  Needing to urinate often, but urinating only a small amount, or constantly dribbling urine (overflow incontinence).  Urinating because you cannot get to the bathroom in time due to a physical disability, such as  arthritis or injury, or communication and thinking problems, such as Alzheimer disease (functional incontinence). How is this diagnosed? This condition may be diagnosed based on:  Your medical history.  A physical exam.  Tests, such as: ? Urine tests. ? X-rays of your kidney and bladder. ? Ultrasound. ? CT scan. ? Cystoscopy. In this procedure, a health care provider inserts a tube with a light and camera (cystoscope) through the urethra and into the bladder in order to check for problems. ? Urodynamic testing. These tests assess how well the bladder, urethra, and sphincter can store and release urine. There are different types of urodynamic tests, and they vary depending on what the test is measuring. To help diagnose your condition, your health care provider may recommend that you keep a log of when you urinate and how much you urinate. How is this treated? Treatment for this condition depends on the type of incontinence that you have and its cause. Treatment may include:  Lifestyle changes, such as: ? Quitting smoking. ? Maintaining a healthy weight. ? Staying active. Try to get 150 minutes of moderate-intensity exercise every week. Ask your health care provider which activities are safe for you. ? Eating a healthy diet.  Avoid high-fat foods, like fried foods.  Avoid refined carbohydrates like white bread and white rice.  Limit how much alcohol and caffeine you drink.  Increase your fiber intake. Foods such as fresh fruits, vegetables, beans, and whole grains are healthy sources of fiber.  Pelvic floor muscle exercises.  Bladder training, such as lengthening the amount of time between bathroom breaks, or using the bathroom at regular intervals.  Using techniques to suppress bladder urges.   This can include distraction techniques or controlled breathing exercises.  Medicines to relax the bladder muscles and prevent bladder spasms.  Medicines to help slow or prevent the  growth of a man's prostate.  Botox injections. These can help relax the bladder muscles.  Using pulses of electricity to help change bladder reflexes (electrical nerve stimulation).  For women, using a medical device to prevent urine leaks. This is a small, tampon-like, disposable device that is inserted into the urethra.  Injecting collagen or carbon beads (bulking agents) into the urinary sphincter. These can help thicken tissue and close the bladder opening.  Surgery. Follow these instructions at home: Lifestyle  Limit alcohol and caffeine. These can fill your bladder quickly and irritate it.  Keep yourself clean to help prevent odors and skin damage. Ask your doctor about special skin creams and cleansers that can protect the skin from urine.  Consider wearing pads or adult diapers. Make sure to change them regularly, and always change them right after experiencing incontinence. General instructions  Take over-the-counter and prescription medicines only as told by your health care provider.  Use the bathroom about every 3-4 hours, even if you do not feel the need to urinate. Try to empty your bladder completely every time. After urinating, wait a minute. Then try to urinate again.  Make sure you are in a relaxed position while urinating.  If your incontinence is caused by nerve problems, keep a log of the medicines you take and the times you go to the bathroom.  Keep all follow-up visits as told by your health care provider. This is important. Contact a health care provider if:  You have pain that gets worse.  Your incontinence gets worse. Get help right away if:  You have a fever or chills.  You are unable to urinate.  You have redness in your groin area or down your legs. Summary  Urinary incontinence refers to a condition in which a person is unable to control where and when to pass urine.  This condition may be caused by medicines, infection, weak bladder  muscles, weak pelvic floor muscles, enlargement of the prostate (in men), or surgery.  The following factors increase your risk for developing this condition: older age, obesity, pregnancy and childbirth, menopause, neurological diseases, and chronic coughing.  There are several types of urinary incontinence. They include urge incontinence, stress incontinence, overflow incontinence, and functional incontinence.  This condition is usually treated first with lifestyle and behavioral changes, such as quitting smoking, eating a healthier diet, and doing regular pelvic floor exercises. Other treatment options include medicines, bulking agents, medical devices, electrical nerve stimulation, or surgery. This information is not intended to replace advice given to you by your health care provider. Make sure you discuss any questions you have with your health care provider. Document Released: 04/09/2004 Document Revised: 03/12/2017 Document Reviewed: 06/11/2016 Elsevier Patient Education  [place].  Urinary Frequency, Adult Urinary frequency means urinating more often than usual. You may urinate every 1-2 hours even though you drink a normal amount of fluid and do not have a bladder infection or condition. Although you urinate more often than normal, the total amount of urine produced in a day is normal. With urinary frequency, you may have an urgent need to urinate often. The stress and anxiety of needing to find a bathroom quickly can make this urge worse. This condition may go away on its own or you may need treatment at home. Home treatment may include bladder  training, exercises, taking medicines, or making changes to your diet. Follow these instructions at home: Bladder health   Keep a bladder diary if told by your health care provider. Keep track of: ? What you eat and drink. ? How often you urinate. ? How much you urinate.  Follow a bladder training program if told by your health care  provider. This may include: ? Learning to delay going to the bathroom. ? Double urinating (voiding). This helps if you are not completely emptying your bladder. ? Scheduled voiding.  Do Kegel exercises as told by your health care provider. Kegel exercises strengthen the muscles that help control urination, which may help the condition. Eating and drinking  If told by your health care provider, make diet changes, such as: ? Avoiding caffeine. ? Drinking fewer fluids, especially alcohol. ? Not drinking in the evening. ? Avoiding foods or drinks that may irritate the bladder. These include coffee, tea, soda, artificial sweeteners, citrus, tomato-based foods, and chocolate. ? Eating foods that help prevent or ease constipation. Constipation can make this condition worse. Your health care provider may recommend that you:  Drink enough fluid to keep your urine pale yellow.  Take over-the-counter or prescription medicines.  Eat foods that are high in fiber, such as beans, whole grains, and fresh fruits and vegetables.  Limit foods that are high in fat and processed sugars, such as fried or sweet foods. General instructions  Take over-the-counter and prescription medicines only as told by your health care provider.  Keep all follow-up visits as told by your health care provider. This is important. Contact a health care provider if:  You start urinating more often.  You feel pain or irritation when you urinate.  You notice blood in your urine.  Your urine looks cloudy.  You develop a fever.  You begin vomiting. Get help right away if:  You are unable to urinate. Summary  Urinary frequency means urinating more often than usual. With urinary frequency, you may urinate every 1-2 hours even though you drink a normal amount of fluid and do not have a bladder infection or other bladder condition.  Your health care provider may recommend that you keep a bladder diary, follow a bladder  training program, or make dietary changes.  If told by your health care provider, do Kegel exercises to strengthen the muscles that help control urination.  Take over-the-counter and prescription medicines only as told by your health care provider.  Contact a health care provider if your symptoms do not improve or get worse. This information is not intended to replace advice given to you by your health care provider. Make sure you discuss any questions you have with your health care provider. Document Released: 12/27/2008 Document Revised: 09/09/2017 Document Reviewed: 09/09/2017 Elsevier Patient Education  2020 [employer].

## 2019-02-21 NOTE — Progress Notes (Signed)
BP 137/81   Pulse 66   Temp 98.8 F (37.1 C)   SpO2 98%    Subjective:    Patient ID: Brandi Acevedo, female    DOB: [birth year], 53 y.o.   MRN: [id number]  HPI: Brandi Acevedo is a 52 y.o. female  Chief Complaint  Patient presents with  . Surgical Clearance   [name] presents for surgical clearance for an achilles tendon repair with podiatry. She has had several surgeries before. Her most recent one was 2018 for CTS. She has never had any problems with anesthesia in the past. She has never had any problems with being extubated. No issues with post-op nausea or vomiting. She has had problems with being very anxious going into the OR and her BP goes high without pre-sedation. She has no family history of issues with anesthesia or malignant hyperthermia. She is not having any trouble breathing. No issues with any CP. She can walk up 1 flights of stairs without getting SOB, but does have some issues due to her COPD.   URINARY SYMPTOMS- notes that she has sudden urgency to urinate, feels a "bubble' and then will have an accident. She had no control on this and it made her feel very embarrassed.  Duration: a few months Dysuria: no Urinary frequency: yes Urgency: yes Small volume voids: no Symptom severity: moderate Urinary incontinence: yes Foul odor: no Hematuria: no Abdominal pain: no Back pain: no Suprapubic pain/pressure: no Flank pain: no Fever:  no Vomiting: no Relief with cranberry juice: no Relief with pyridium: no Status:stable Previous urinary tract infection: yes Recurrent urinary tract infection: no History of sexually transmitted disease: no Vaginal discharge: no Treatments attempted: none    Active Ambulatory Problems    Diagnosis Date Noted  . Sleep apnea   . RLS (restless legs syndrome) 02/26/2015  . Tobacco abuse 02/26/2015  . IBS (irritable bowel syndrome) 02/26/2015  . GERD (gastroesophageal reflux disease) 02/26/2015  . Recurrent knee pain  04/25/2015  . Depression, major, single episode, severe ([place]) 05/16/2015  . Fracture of fifth metacarpal bone of right hand 05/21/2015  . Chronic obstructive pulmonary disease (COPD) ([place]) 07/18/2015  . CTS (carpal tunnel syndrome) 12/17/2015  . Hyperlipidemia 12/18/2015  . IFG (impaired fasting glucose) 12/18/2015  . Elevated ferritin 12/18/2015  . 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 Suicidal ideation 05/16/2015  . Self-inflicted injury 53/61/4431   Past Medical History:  Diagnosis Date  . COPD (chronic obstructive pulmonary disease) ([place])   . Hearing loss   . Heart attack ([place])   . Pre-diabetes    Past Surgical History:  Procedure Laterality Date  . ABDOMINAL HYSTERECTOMY    . ACHILLES TENDON REPAIR    . CARDIAC CATHETERIZATION     LAD stent  . CARPAL TUNNEL RELEASE Right 04/23/2016   Procedure: CARPAL TUNNEL RELEASE;  Surgeon: Hessie Knows, MD;  Location: [facility] ORS;  Service: Orthopedics;  Laterality: Right;  . CESAREAN SECTION     X 3  . CHOLECYSTECTOMY    . DILATION AND CURETTAGE OF UTERUS    . HERNIA REPAIR    . TONSILLECTOMY    . TYMPANOSTOMY TUBE PLACEMENT     X 2   Outpatient Encounter Medications as of 02/21/2019  Medication Sig  . albuterol (PROVENTIL) (2.5 MG/3ML) 0.083% nebulizer solution Take 3 mLs (2.5 mg total) by nebulization every 6 (six) hours as needed for wheezing or shortness of breath. (Patient not taking:  Reported on 01/02/2019)  . citalopram (CELEXA) 40 MG tablet Take 1 tablet (40 mg total) by mouth daily.  . diclofenac sodium (VOLTAREN) 1 % GEL Apply 4 g topically 4 (four) times daily.  Marland Kitchen ezetimibe (ZETIA) 10 MG tablet Take 1 tablet (10 mg total) by mouth daily.  . fenofibrate 160 MG tablet TAKE 1 TABLET BY MOUTH ONCE DAILY  . fluticasone (FLONASE) 50 MCG/ACT nasal spray Place 2 sprays into both nostrils daily.  Marland Kitchen gabapentin (NEURONTIN) 100 MG capsule Take 1 capsule (100 mg total) by mouth  daily.  Marland Kitchen gemfibrozil (LOPID) 600 MG tablet Take 1 tablet (600 mg total) by mouth 2 (two) times daily before a meal.  . hydrOXYzine (ATARAX/VISTARIL) 25 MG tablet Take 1 tablet (25 mg total) by mouth 3 (three) times daily as needed.  . nicotine (NICODERM CQ) 14 mg/24hr patch Place 1 patch (14 mg total) onto the skin daily. (Patient not taking: Reported on 02/17/2019)  . pantoprazole (PROTONIX) 40 MG tablet Take 1 tablet (40 mg total) by mouth daily.  Marland Kitchen rOPINIRole (REQUIP XL) 2 MG 24 hr tablet Take 1 tablet (2 mg total) by mouth at bedtime.  Marland Kitchen rOPINIRole (REQUIP) 1 MG tablet Take 1 tablet (1 mg total) by mouth daily. At night  . umeclidinium-vilanterol (ANORO ELLIPTA) 62.5-25 MCG/INH AEPB Inhale 1 puff into the lungs daily.  . [DISCONTINUED] azithromycin (ZITHROMAX) 250 MG tablet 2 tabs today and then 1 tab daily for 4 days (dx: copd exacerbation)   No facility-administered encounter medications on file as of 02/21/2019.    Allergies  Allergen Reactions  . Amoxicillin-Pot Clavulanate Itching  . Ativan [Lorazepam] Other (See Comments)    Altered mental status  . Atorvastatin Other (See Comments)    Causes seizures  . Codeine     Gi upset  . Wellbutrin [Bupropion] Other (See Comments)    Depression  . Flagyl [Metronidazole] Rash  . Keflex [Cephalexin] Rash  . Septra [Sulfamethoxazole-Trimethoprim] Rash   Family History  Problem Relation Age of Onset  . Diabetes Mother   . Restless legs syndrome Mother   . Sleep apnea Mother   . Hypertension Mother   . Hyperlipidemia Mother   . Diabetes Father   . Hypertension Father   . Hyperlipidemia Father   . Heart disease Father   . Heart attack Father   . Diabetes Sister   . Hyperlipidemia Sister   . Hypertension Sister   . Heart disease Sister   . Heart attack Sister   . Hyperlipidemia Daughter   . Mental illness Daughter        Bipolar Disorder  . Heart disease Maternal Grandmother   . Stroke Maternal Grandfather   . Heart attack  Maternal Grandfather   . Cancer Paternal Grandmother   . Hyperlipidemia Daughter    Social History   Socioeconomic History  . Marital status: Single    Spouse name: Not on file  . Number of children: Not on file  . Years of education: Not on file  . Highest education level: Not on file  Occupational History  . Not on file  Social Needs  . Financial resource strain: Not on file  . Food insecurity    Worry: Not on file    Inability: Not on file  . Transportation needs    Medical: Not on file    Non-medical: Not on file  Tobacco Use  . Smoking status: Current Every Day Smoker    Packs/day: 0.50    Types: Cigarettes  .  Smokeless tobacco: Never Used  Substance and Sexual Activity  . Alcohol use: No  . Drug use: No  . Sexual activity: Not Currently  Lifestyle  . Physical activity    Days per week: Not on file    Minutes per session: Not on file  . Stress: Not on file  Relationships  . Social Herbalist on phone: Not on file    Gets together: Not on file    Attends religious service: Not on file    Active member of club or organization: Not on file    Attends meetings of clubs or organizations: Not on file    Relationship status: Not on file  Other Topics Concern  . Not on file  Social History Narrative  . Not on file    Review of Systems  Constitutional: Negative.   HENT: Negative.   Respiratory: Positive for cough. Negative for apnea, choking, chest tightness, shortness of breath, wheezing and stridor.   Cardiovascular: Negative.   Gastrointestinal: Negative.   Genitourinary: Positive for urgency. Negative for decreased urine volume, difficulty urinating, dyspareunia, dysuria, enuresis, flank pain, frequency, genital sores, hematuria, menstrual problem, pelvic pain, vaginal bleeding, vaginal discharge and vaginal pain.  Musculoskeletal: Positive for arthralgias and gait problem. Negative for back pain, joint swelling, myalgias, neck pain and neck  stiffness.  Skin: Negative.   Psychiatric/Behavioral: Negative.     Per HPI unless specifically indicated above     Objective:    BP 137/81   Pulse 66   Temp 98.8 F (37.1 C)   SpO2 98%   Wt Readings from Last 3 Encounters:  01/02/19 180 lb (81.6 kg)  11/07/18 178 lb (80.7 kg)  07/19/17 168 lb 1 oz (76.2 kg)    Physical Exam Vitals signs and nursing note reviewed.  Constitutional:      General: She is not in acute distress.    Appearance: Normal appearance. She is not ill-appearing, toxic-appearing or diaphoretic.  HENT:     Head: Normocephalic and atraumatic.     Right Ear: External ear normal.     Left Ear: External ear normal.     Nose: Nose normal.     Mouth/Throat:     Mouth: Mucous membranes are moist.     Pharynx: Oropharynx is clear.  Eyes:     General: No scleral icterus.       Right eye: No discharge.        Left eye: No discharge.     Extraocular Movements: Extraocular movements intact.     Conjunctiva/sclera: Conjunctivae normal.     Pupils: Pupils are equal, round, and reactive to light.  Neck:     Musculoskeletal: Normal range of motion and neck supple.  Cardiovascular:     Rate and Rhythm: Normal rate and regular rhythm.     Pulses: Normal pulses.     Heart sounds: Normal heart sounds. No murmur. No friction rub. No gallop.   Pulmonary:     Effort: Pulmonary effort is normal. No respiratory distress.     Breath sounds: Normal breath sounds. No stridor. No wheezing, rhonchi or rales.  Chest:     Chest wall: No tenderness.  Musculoskeletal: Normal range of motion.  Skin:    General: Skin is warm and dry.     Capillary Refill: Capillary refill takes less than 2 seconds.     Coloration: Skin is not jaundiced or pale.     Findings: No bruising, erythema, lesion or rash.  Neurological:     General: No focal deficit present.     Mental Status: She is alert and oriented to person, place, and time. Mental status is at baseline.  Psychiatric:         Mood and Affect: Mood normal.        Behavior: Behavior normal.        Thought Content: Thought content normal.        Judgment: Judgment normal.     Results for orders placed or performed in visit on 02/17/19  CBC with Differential OUT  Result Value Ref Range   WBC 7.8 3.4 - 10.8 x10E3/uL   RBC 4.52 3.77 - 5.28 x10E6/uL   Hemoglobin 13.3 11.1 - 15.9 g/dL   Hematocrit 40.4 34.0 - 46.6 %   MCV 89 79 - 97 fL   MCH 29.4 26.6 - 33.0 pg   MCHC 32.9 31.5 - 35.7 g/dL   RDW 13.0 11.7 - 15.4 %   Platelets 284 150 - 450 x10E3/uL   Neutrophils 47 Not Estab. %   Lymphs 41 Not Estab. %   Monocytes 6 Not Estab. %   Eos 4 Not Estab. %   Basos 2 Not Estab. %   Neutrophils Absolute 3.7 1.4 - 7.0 x10E3/uL   Lymphocytes Absolute 3.2 (H) 0.7 - 3.1 x10E3/uL   Monocytes Absolute 0.5 0.1 - 0.9 x10E3/uL   EOS (ABSOLUTE) 0.3 0.0 - 0.4 x10E3/uL   Basophils Absolute 0.2 0.0 - 0.2 x10E3/uL   Immature Granulocytes 0 Not Estab. %   Immature Grans (Abs) 0.0 0.0 - 0.1 x10E3/uL  CoaguChek XS/INR Waived  Result Value Ref Range   INR 1.0 0.9 - 1.1   Prothrombin Time 12.3 sec  Comp Met (CMET)  Result Value Ref Range   Glucose 117 (H) 65 - 99 mg/dL   BUN 10 6 - 24 mg/dL   Creatinine, Ser 0.74 0.57 - 1.00 mg/dL   GFR calc non Af Amer 93 >59 mL/min/1.73   GFR calc Af Amer 107 >59 mL/min/1.73   BUN/Creatinine Ratio 14 9 - 23   Sodium 137 134 - 144 mmol/L   Potassium 4.2 3.5 - 5.2 mmol/L   Chloride 99 96 - 106 mmol/L   CO2 26 20 - 29 mmol/L   Calcium 9.5 8.7 - 10.2 mg/dL   Total Protein 7.2 6.0 - 8.5 g/dL   Albumin 4.6 3.8 - 4.9 g/dL   Globulin, Total 2.6 1.5 - 4.5 g/dL   Albumin/Globulin Ratio 1.8 1.2 - 2.2   Bilirubin Total 0.3 0.0 - 1.2 mg/dL   Alkaline Phosphatase 66 39 - 117 IU/L   AST 40 0 - 40 IU/L   ALT 67 (H) 0 - 32 IU/L      Assessment & Plan:   Problem List Items Addressed This Visit    None    Visit Diagnoses    Preop exam for internal medicine    -  Primary   Labs normal. EKG  unchanged from prior. Cleared for surgery. Note forwarded to podiatry.    Relevant Orders   EKG 12-Lead (Completed)   Urinary urgency       Will check urine and will start timed voidings. If not getting better, or getting worse- let us know.    Relevant Orders   UA/M w/rflx Culture, Routine       Follow up plan: Return in about 2 months (around 04/24/2019) for Physical.

## 2019-03-03 ENCOUNTER — Telehealth: Payer: Self-pay | Admitting: Family Medicine

## 2019-03-03 NOTE — Telephone Encounter (Signed)
Routing to provider  

## 2019-03-03 NOTE — Telephone Encounter (Signed)
Robin at [facility] states the rOPINIRole (REQUIP XL) 2 MG 24 hr tablet Is on backorder, and they do not know when they will get. The regular rOPINIRole is available, just not the XR.  Requesting an alternate Rx called into  [facility] ([address] Phone:  [phone]  Fax:  [phone]

## 2019-03-07 ENCOUNTER — Encounter: Payer: Self-pay | Admitting: Family Medicine

## 2019-03-09 MED ORDER — ROPINIROLE HCL 2 MG PO TABS: 2.0000 mg | ORAL_TABLET | Freq: Three times a day (TID) | ORAL | 1 refills | Status: DC

## 2019-03-09 NOTE — Telephone Encounter (Signed)
Rx sent to her pharmacy 

## 2019-03-14 ENCOUNTER — Encounter: Payer: Self-pay | Admitting: Podiatry

## 2019-03-14 ENCOUNTER — Other Ambulatory Visit: Payer: Self-pay

## 2019-03-20 ENCOUNTER — Other Ambulatory Visit: Payer: Self-pay

## 2019-03-20 ENCOUNTER — Other Ambulatory Visit
Admission: RE | Admit: 2019-03-20 | Discharge: 2019-03-20 | Disposition: A | Discharge status: Home / Self Care | Payer: BC Managed Care – PPO | Source: Ambulatory Visit | Attending: Podiatry | Admitting: Podiatry

## 2019-03-20 ENCOUNTER — Other Ambulatory Visit: Payer: Self-pay | Admitting: Podiatry

## 2019-03-20 DIAGNOSIS — Z01812 Encounter for preprocedural laboratory examination: Secondary | ICD-10-CM | POA: Diagnosis not present

## 2019-03-20 DIAGNOSIS — Z20822 Contact with and (suspected) exposure to covid-19: Secondary | ICD-10-CM | POA: Insufficient documentation

## 2019-03-20 LAB — SARS CORONAVIRUS 2 (TAT 6-24 HRS): SARS Coronavirus 2: NEGATIVE

## 2019-03-23 ENCOUNTER — Ambulatory Visit: Payer: BC Managed Care – PPO | Admitting: Anesthesiology

## 2019-03-23 ENCOUNTER — Encounter
Admission: RE | Disposition: A | Discharge status: Home / Self Care | Payer: Self-pay | Source: Ambulatory Visit | Attending: Podiatry

## 2019-03-23 ENCOUNTER — Ambulatory Visit
Admission: RE | Admit: 2019-03-23 | Discharge: 2019-03-23 | Disposition: A | Discharge status: Home / Self Care | Payer: BC Managed Care – PPO | Source: Ambulatory Visit | Attending: Podiatry | Admitting: Podiatry

## 2019-03-23 ENCOUNTER — Other Ambulatory Visit: Payer: Self-pay

## 2019-03-23 ENCOUNTER — Encounter: Payer: Self-pay | Admitting: Podiatry

## 2019-03-23 DIAGNOSIS — G4730 Sleep apnea, unspecified: Secondary | ICD-10-CM | POA: Diagnosis not present

## 2019-03-23 DIAGNOSIS — M7662 Achilles tendinitis, left leg: Secondary | ICD-10-CM | POA: Diagnosis not present

## 2019-03-23 DIAGNOSIS — Z882 Allergy status to sulfonamides status: Secondary | ICD-10-CM | POA: Insufficient documentation

## 2019-03-23 DIAGNOSIS — F17200 Nicotine dependence, unspecified, uncomplicated: Secondary | ICD-10-CM | POA: Diagnosis not present

## 2019-03-23 DIAGNOSIS — J449 Chronic obstructive pulmonary disease, unspecified: Secondary | ICD-10-CM | POA: Insufficient documentation

## 2019-03-23 DIAGNOSIS — M899 Disorder of bone, unspecified: Secondary | ICD-10-CM | POA: Insufficient documentation

## 2019-03-23 DIAGNOSIS — E785 Hyperlipidemia, unspecified: Secondary | ICD-10-CM | POA: Diagnosis not present

## 2019-03-23 DIAGNOSIS — Z885 Allergy status to narcotic agent status: Secondary | ICD-10-CM | POA: Diagnosis not present

## 2019-03-23 DIAGNOSIS — E119 Type 2 diabetes mellitus without complications: Secondary | ICD-10-CM | POA: Diagnosis not present

## 2019-03-23 DIAGNOSIS — G2581 Restless legs syndrome: Secondary | ICD-10-CM | POA: Diagnosis not present

## 2019-03-23 DIAGNOSIS — Z791 Long term (current) use of non-steroidal anti-inflammatories (NSAID): Secondary | ICD-10-CM | POA: Diagnosis not present

## 2019-03-23 DIAGNOSIS — Z79899 Other long term (current) drug therapy: Secondary | ICD-10-CM | POA: Diagnosis not present

## 2019-03-23 DIAGNOSIS — Z881 Allergy status to other antibiotic agents status: Secondary | ICD-10-CM | POA: Insufficient documentation

## 2019-03-23 DIAGNOSIS — K219 Gastro-esophageal reflux disease without esophagitis: Secondary | ICD-10-CM | POA: Insufficient documentation

## 2019-03-23 HISTORY — DX: Unspecified hearing loss, left ear: H91.92

## 2019-03-23 HISTORY — DX: Presence of dental prosthetic device (complete) (partial): Z97.2

## 2019-03-23 HISTORY — DX: Trigger thumb, left thumb: M65.312

## 2019-03-23 HISTORY — DX: Unspecified convulsions: R56.9

## 2019-03-23 HISTORY — DX: Presence of external hearing-aid: Z97.4

## 2019-03-23 HISTORY — PX: ACHILLES TENDON SURGERY: SHX542

## 2019-03-23 HISTORY — PX: CALCANEAL OSTEOTOMY: SHX1281

## 2019-03-23 SURGERY — REPAIR, TENDON, ACHILLES
Anesthesia: General | Site: Leg Lower | Laterality: Left

## 2019-03-23 MED ORDER — FENTANYL CITRATE (PF) 100 MCG/2ML IJ SOLN
INTRAMUSCULAR | Status: DC | PRN
  Administered 2019-03-23: 25 ug via INTRAVENOUS
  Administered 2019-03-23: 100 ug via INTRAVENOUS
  Administered 2019-03-23: 25 ug via INTRAVENOUS

## 2019-03-23 MED ORDER — OXYCODONE HCL 5 MG PO TABS: 5.0000 mg | ORAL_TABLET | Freq: Once | ORAL | Status: DC | PRN

## 2019-03-23 MED ORDER — CLINDAMYCIN PHOSPHATE 900 MG/50ML IV SOLN
900.0000 mg | INTRAVENOUS | Status: AC
  Administered 2019-03-23: 07:00:00 900 mg via INTRAVENOUS

## 2019-03-23 MED ORDER — MIDAZOLAM HCL 5 MG/5ML IJ SOLN
INTRAMUSCULAR | Status: DC | PRN
  Administered 2019-03-23: 2 mg via INTRAVENOUS

## 2019-03-23 MED ORDER — OXYCODONE HCL 5 MG/5ML PO SOLN: 5.0000 mg | Freq: Once | ORAL | Status: DC | PRN

## 2019-03-23 MED ORDER — SUCCINYLCHOLINE CHLORIDE 20 MG/ML IJ SOLN
INTRAMUSCULAR | Status: DC | PRN
  Administered 2019-03-23: 100 mg via INTRAVENOUS

## 2019-03-23 MED ORDER — POVIDONE-IODINE 7.5 % EX SOLN: Freq: Once | CUTANEOUS | Status: DC

## 2019-03-23 MED ORDER — LACTATED RINGERS IV SOLN: INTRAVENOUS | Status: DC | PRN

## 2019-03-23 MED ORDER — PROPOFOL 10 MG/ML IV BOLUS
INTRAVENOUS | Status: DC | PRN
  Administered 2019-03-23: 200 mg via INTRAVENOUS

## 2019-03-23 MED ORDER — ONDANSETRON HCL 4 MG/2ML IJ SOLN
INTRAMUSCULAR | Status: DC | PRN
  Administered 2019-03-23: 4 mg via INTRAVENOUS

## 2019-03-23 MED ORDER — EPHEDRINE SULFATE 50 MG/ML IJ SOLN
INTRAMUSCULAR | Status: DC | PRN
  Administered 2019-03-23: 10 mg via INTRAVENOUS
  Administered 2019-03-23: 5 mg via INTRAVENOUS
  Administered 2019-03-23: 10 mg via INTRAVENOUS

## 2019-03-23 MED ORDER — DEXAMETHASONE SODIUM PHOSPHATE 4 MG/ML IJ SOLN
INTRAMUSCULAR | Status: DC | PRN
  Administered 2019-03-23: 4 mg via INTRAVENOUS

## 2019-03-23 MED ORDER — OXYCODONE-ACETAMINOPHEN 7.5-325 MG PO TABS: 1.0000 | ORAL_TABLET | ORAL | 0 refills | Status: AC | PRN

## 2019-03-23 MED ORDER — ROPIVACAINE HCL 5 MG/ML IJ SOLN
INTRAMUSCULAR | Status: DC | PRN
  Administered 2019-03-23: 200 mg via PERINEURAL

## 2019-03-23 MED ORDER — GLYCOPYRROLATE 0.2 MG/ML IJ SOLN
INTRAMUSCULAR | Status: DC | PRN
  Administered 2019-03-23 (×2): .1 mg via INTRAVENOUS

## 2019-03-23 MED ORDER — PROMETHAZINE HCL 12.5 MG PO TABS: 12.5000 mg | ORAL_TABLET | Freq: Four times a day (QID) | ORAL | 0 refills | Status: DC | PRN

## 2019-03-23 MED ORDER — ONDANSETRON HCL 4 MG/2ML IJ SOLN: 4.0000 mg | Freq: Once | INTRAMUSCULAR | Status: DC | PRN

## 2019-03-23 MED ORDER — FENTANYL CITRATE (PF) 100 MCG/2ML IJ SOLN: 25.0000 ug | INTRAMUSCULAR | Status: DC | PRN

## 2019-03-23 MED ORDER — KETOROLAC TROMETHAMINE 30 MG/ML IJ SOLN: 15.0000 mg | Freq: Once | INTRAMUSCULAR | Status: DC | PRN

## 2019-03-23 SURGICAL SUPPLY — 40 items
ANCHOR JUGGERKNOT WTAP NDL 2.9 (Anchor) ×1 IMPLANT
BIT DRILL JUGRKNT W/NDL BIT2.9 (DRILL) IMPLANT
BLADE OSCILLATING/SAGITTAL (BLADE) ×1
BLADE SURG 15 STRL LF DISP TIS (BLADE) IMPLANT
BLADE SURG 15 STRL SS (BLADE)
BLADE SW THK.38XMED LNG THN (BLADE) IMPLANT
BNDG ELASTIC 4X5.8 VLCR NS LF (GAUZE/BANDAGES/DRESSINGS) ×2 IMPLANT
BNDG ESMARK 4X12 TAN STRL LF (GAUZE/BANDAGES/DRESSINGS) ×2 IMPLANT
BNDG GAUZE 4.5X4.1 6PLY STRL (MISCELLANEOUS) ×2 IMPLANT
BNDG STRETCH 4X75 STRL LF (GAUZE/BANDAGES/DRESSINGS) ×2 IMPLANT
CANISTER SUCT 1200ML W/VALVE (MISCELLANEOUS) ×2 IMPLANT
COVER LIGHT HANDLE UNIVERSAL (MISCELLANEOUS) ×4 IMPLANT
CUFF TOURN SGL QUICK 30 (TOURNIQUET CUFF) ×1
CUFF TRNQT CYL 30X4X21-28X (TOURNIQUET CUFF) ×1 IMPLANT
DRILL JUGGERKNOT W/NDL BIT 2.9 (DRILL) ×2
DURAPREP 26ML APPLICATOR (WOUND CARE) ×2 IMPLANT
ELECT REM PT RETURN 9FT ADLT (ELECTROSURGICAL) ×2
ELECTRODE REM PT RTRN 9FT ADLT (ELECTROSURGICAL) ×1 IMPLANT
GAUZE SPONGE 4X4 12PLY STRL (GAUZE/BANDAGES/DRESSINGS) ×2 IMPLANT
GAUZE XEROFORM 1X8 LF (GAUZE/BANDAGES/DRESSINGS) ×2 IMPLANT
GLOVE BIO SURGEON STRL SZ8 (GLOVE) ×4 IMPLANT
GOWN STRL REUS W/ TWL LRG LVL3 (GOWN DISPOSABLE) ×1 IMPLANT
GOWN STRL REUS W/ TWL XL LVL3 (GOWN DISPOSABLE) ×1 IMPLANT
GOWN STRL REUS W/TWL LRG LVL3 (GOWN DISPOSABLE) ×1
GOWN STRL REUS W/TWL XL LVL3 (GOWN DISPOSABLE) ×1
KIT TURNOVER KIT A (KITS) ×2 IMPLANT
NS IRRIG 500ML POUR BTL (IV SOLUTION) ×2 IMPLANT
PACK EXTREMITY ARMC (MISCELLANEOUS) ×2 IMPLANT
PADDING CAST BLEND 4X4 NS (MISCELLANEOUS) ×6 IMPLANT
PENCIL SMOKE EVACUATOR (MISCELLANEOUS) ×2 IMPLANT
RASP SM TEAR CROSS CUT (RASP) ×1 IMPLANT
SPLINT CAST 1 STEP 4X30 (MISCELLANEOUS) ×2 IMPLANT
SPLINT FAST PLASTER 5X30 (CAST SUPPLIES) ×1
SPLINT PLASTER CAST FAST 5X30 (CAST SUPPLIES) ×1 IMPLANT
STOCKINETTE STRL 6IN 960660 (GAUZE/BANDAGES/DRESSINGS) ×2 IMPLANT
STRIP CLOSURE SKIN 1/4X4 (GAUZE/BANDAGES/DRESSINGS) ×2 IMPLANT
SUT VIC AB 2-0 SH 27 (SUTURE) ×1
SUT VIC AB 2-0 SH 27XBRD (SUTURE) IMPLANT
SUT VIC AB 4-0 FS2 27 (SUTURE) ×2 IMPLANT
SWABSTK COMLB BENZOIN TINCTURE (MISCELLANEOUS) ×1 IMPLANT

## 2019-03-23 NOTE — Discharge Instructions (Signed)
[place] DR. Ianmichael Amescua, DR. Vickki Muff, AND DR. [place]   1. Take your medication as prescribed.  Pain medication should be taken only as needed.  2. Keep the dressing and splint clean, dry and intact.  3. Keep your foot elevated above the heart level for the first 48 hours.      Please stay non weight bearing.  Use crutches or scooter to keep all the weight off of the left foot  4. Do not take a shower. Baths are permissible as long as the foot is kept out of the water.   5. Every hour you are awake:  - Bend your knee 15 times. - Flex foot 15 times - Massage calf 15 times  6. Call [facility] [phone]) if any of the following problems occur: - You develop a temperature or fever. - The bandage becomes saturated with blood. - Medication does not stop your pain. - Injury of the foot occurs. - Any symptoms of infection including redness, odor, or red streaks running from wound.   General Anesthesia, Adult, Care After This sheet gives you information about how to care for yourself after your procedure. Your health care provider may also give you more specific instructions. If you have problems or questions, contact your health care provider. What can I expect after the procedure? After the procedure, the following side effects are common:  Pain or discomfort at the IV site.  Nausea.  Vomiting.  Sore throat.  Trouble concentrating.  Feeling cold or chills.  Weak or tired.  Sleepiness and fatigue.  Soreness and body aches. These side effects can affect parts of the body that were not involved in surgery. Follow these instructions at home:  For at least 24 hours after the procedure:  Have a responsible adult stay with you. It is important to have someone help care for you until you are awake and alert.  Rest as needed.  Do not: ? Participate in  activities in which you could fall or become injured. ? Drive. ? Use heavy machinery. ? Drink alcohol. ? Take sleeping pills or medicines that cause drowsiness. ? Make important decisions or sign legal documents. ? Take care of children on your own. Eating and drinking  Follow any instructions from your health care provider about eating or drinking restrictions.  When you feel hungry, start by eating small amounts of foods that are soft and easy to digest (bland), such as toast. Gradually return to your regular diet.  Drink enough fluid to keep your urine pale yellow.  If you vomit, rehydrate by drinking water, juice, or clear broth. General instructions  If you have sleep apnea, surgery and certain medicines can increase your risk for breathing problems. Follow instructions from your health care provider about wearing your sleep device: ? Anytime you are sleeping, including during daytime naps. ? While taking prescription pain medicines, sleeping medicines, or medicines that make you drowsy.  Return to your normal activities as told by your health care provider. Ask your health care provider what activities are safe for you.  Take over-the-counter and prescription medicines only as told by your health care provider.  If you smoke, do not smoke without supervision.  Keep all follow-up visits as told by your health care provider. This is important. Contact a health care provider if:  You have nausea or vomiting that does not get better with medicine.  You cannot eat  or drink without vomiting.  You have pain that does not get better with medicine.  You are unable to pass urine.  You develop a skin rash.  You have a fever.  You have redness around your IV site that gets worse. Get help right away if:  You have difficulty breathing.  You have chest pain.  You have blood in your urine or stool, or you vomit blood. Summary  After the procedure, it is common to have a  sore throat or nausea. It is also common to feel tired.  Have a responsible adult stay with you for the first 24 hours after general anesthesia. It is important to have someone help care for you until you are awake and alert.  When you feel hungry, start by eating small amounts of foods that are soft and easy to digest (bland), such as toast. Gradually return to your regular diet.  Drink enough fluid to keep your urine pale yellow.  Return to your normal activities as told by your health care provider. Ask your health care provider what activities are safe for you. This information is not intended to replace advice given to you by your health care provider. Make sure you discuss any questions you have with your health care provider. Document Released: 06/08/2000 Document Revised: 03/05/2017 Document Reviewed: 10/16/2016 Elsevier Patient Education  2020 [employer].

## 2019-03-23 NOTE — Anesthesia Preprocedure Evaluation (Signed)
Anesthesia Evaluation  Patient identified by MRN, date of birth, ID band Patient awake    Reviewed: Allergy & Precautions, H&P , NPO status , Patient's Chart, lab work & pertinent test results, reviewed documented beta blocker date and time   Airway Mallampati: II  TM Distance: >3 FB Neck ROM: full    Dental  (+) Upper Dentures   Pulmonary sleep apnea , COPD, Current Smoker and Patient abstained from smoking.,    Pulmonary exam normal breath sounds clear to auscultation       Cardiovascular Exercise Tolerance: Good negative cardio ROS   Rhythm:regular Rate:Normal     Neuro/Psych Seizures - (x1, related to statin use),  RLS negative psych ROS   GI/Hepatic Neg liver ROS, GERD  ,IBS   Endo/Other  negative endocrine ROS  Renal/GU negative Renal ROS  negative genitourinary   Musculoskeletal   Abdominal   Peds  Hematology negative hematology ROS (+)   Anesthesia Other Findings   Reproductive/Obstetrics negative OB ROS                             Anesthesia Physical Anesthesia Plan  ASA: III  Anesthesia Plan: General   Post-op Pain Management: GA combined w/ Regional for post-op pain   Induction:   PONV Risk Score and Plan: 2 and Ondansetron and Dexamethasone  Airway Management Planned:   Additional Equipment:   Intra-op Plan:   Post-operative Plan:   Informed Consent: I have reviewed the patients History and Physical, chart, labs and discussed the procedure including the risks, benefits and alternatives for the proposed anesthesia with the patient or authorized representative who has indicated his/her understanding and acceptance.     Dental Advisory Given  Plan Discussed with: CRNA  Anesthesia Plan Comments:         Anesthesia Quick Evaluation

## 2019-03-23 NOTE — Anesthesia Procedure Notes (Addendum)
Anesthesia Regional Block: Popliteal block   Pre-Anesthetic Checklist: ,, timeout performed, Correct Patient, Correct Site, Correct Laterality, Correct Procedure, Correct Position, site marked, Risks and benefits discussed,  Surgical consent,  Pre-op evaluation,  At surgeon's request and post-op pain management  Laterality: Left  Prep: chloraprep       Needles:  Injection technique: Single-shot  Needle Type: Echogenic Needle     Needle Length: 9cm  Needle Gauge: 21     Additional Needles:   Procedures:,,,, ultrasound used (permanent image in chart),,,,  Narrative:  Injection made incrementally with aspirations every 5 mL.  Performed by: Personally   Additional Notes: Functioning IV was confirmed and monitors applied. Ultrasound guidance: relevant anatomy identified, needle position confirmed, local anesthetic spread visualized around nerve(s)., vascular puncture avoided.  Image printed for medical record.  Negative aspiration and no paresthesias; incremental administration of local anesthetic. The patient tolerated the procedure well. Vitals signes recorded in RN notes.

## 2019-03-23 NOTE — Anesthesia Procedure Notes (Signed)
Procedure Name: Intubation Date/Time: 03/23/2019 7:34 AM Performed by: Cameron Ali, CRNA Pre-anesthesia Checklist: Patient identified, Emergency Drugs available, Suction available, Patient being monitored and Timeout performed Patient Re-evaluated:Patient Re-evaluated prior to induction Oxygen Delivery Method: Circle system utilized Preoxygenation: Pre-oxygenation with 100% oxygen Induction Type: IV induction Ventilation: Mask ventilation without difficulty Laryngoscope Size: Mac and 3 Grade View: Grade II Tube type: Oral Tube size: 7.0 mm Number of attempts: 1 Placement Confirmation: ETT inserted through vocal cords under direct vision,  positive ETCO2 and breath sounds checked- equal and bilateral Tube secured with: Tape Dental Injury: Teeth and Oropharynx as per pre-operative assessment

## 2019-03-23 NOTE — H&P (Signed)
H and P has been reviewed and no changes are noted.  

## 2019-03-23 NOTE — Transfer of Care (Signed)
Immediate Anesthesia Transfer of Care Note  Patient: Brandi Acevedo  Procedure(s) Performed: ACHILLES REPAIR-SECONDARY LEFT (Left Leg Lower) PARTIAL CALCANECTOMY LEFT (Left Leg Lower)  Patient Location: PACU  Anesthesia Type: General  Level of Consciousness: awake, alert  and patient cooperative  Airway and Oxygen Therapy: Patient Spontanous Breathing and Patient connected to supplemental oxygen  Post-op Assessment: Post-op Vital signs reviewed, Patient's Cardiovascular Status Stable, Respiratory Function Stable, Patent Airway and No signs of Nausea or vomiting  Post-op Vital Signs: Reviewed and stable  Complications: No apparent anesthesia complications

## 2019-03-23 NOTE — Anesthesia Postprocedure Evaluation (Signed)
Anesthesia Post Note  Patient: Yaret Riopel Odaniel  Procedure(s) Performed: ACHILLES REPAIR-SECONDARY LEFT (Left Leg Lower) PARTIAL CALCANECTOMY LEFT (Left Leg Lower)     Patient location during evaluation: PACU Anesthesia Type: General and Regional Level of consciousness: awake and alert Pain management: pain level controlled Vital Signs Assessment: post-procedure vital signs reviewed and stable Respiratory status: spontaneous breathing, nonlabored ventilation, respiratory function stable and patient connected to nasal cannula oxygen Cardiovascular status: blood pressure returned to baseline and stable Postop Assessment: no apparent nausea or vomiting Anesthetic complications: no    Alisa Graff

## 2019-03-23 NOTE — Op Note (Signed)
Operative note   Surgeon: Dr. Albertine Patricia, DPM.    Assistant: None    Preop diagnosis: 1.  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  Chronic exostosis posterior calcaneus left    Postop diagnosis: Same    Procedure:   1.  Secondary repair of Achilles tendon with tendon anchor left   2.  Excision of retrocalcaneal exostosis left       EBL: 5 cc or less    Anesthesia:general delivered by the anesthesia team.  Patient also had a popliteal block delivered by the anesthesia team    Hemostasis: Thigh tourniquet at 3 mmHg pressure for 52 minutes    Specimen: None    Complications: None    Operative indications: Chronic pain unresponsive to conservative care    Procedure:  Patient was brought into the OR and placed on the operating table in theprone position. After anesthesia was obtained theleft lower extremity was prepped and draped in usual sterile fashion.  Operative Report: At this time attention directed the posterior calcaneus on the left heel a 3.5 cm linear incision was made along the midline of the calcaneus from dorsal to plantar.  This was deepened with sharp blunt dissection layers were encountered and carefully separated for better closure at the end of the case.  The Achilles tendon was identified at this point and incised midline longitudinally from dorsal to plantar this was then dissected away from the bone proliferation on the posterior calcaneus.  The posterior calcaneus had a prolific amount of bone which was resected with a combination of sagittal saw power equipment as well as power rasp.  The Haglund's deformity area on the dorsal posterior calcaneus also removed.  There is an copiously irrigated and checked FluoroScan.  A few areas need to be rasped for this was accomplished the area was irrigated again and FluoroScan showed excellent removal and contours on the posterior calcaneus.  This point a 2.9 juggernaut anchor was embedded into the  posterior calcaneus this had 2 separate sutures coming from the anchor and was used to anchor the Achilles tendon back down to bone.  2-0 Vicryl was used in both the simple erupted and a continuous stitch to close the Achilles incision margin.  2-0 Vicryl was also used to suture the distal portion of the tendon back down to soft tissue distally.  The peritenon and tendon sheath was then closed with 4-0 Vicryl in a continuous stitch as were deep superficial fascial layers.  Skin was closed with 4-0 Vicryl subcuticular stitch.  Sterile compressive dressings placed across wound assisting of Steri-Strips Xeroform gauze 4 x 4's Kling and Kerlix the tourniquet was released and brought complete vascular nursing return to digits of the left foot.  A posterior splint was placed on the left foot leg in the operating room with plantar flexed.    Patient tolerated the procedure and anesthesia well.  Was transported from the OR to the PACU with all vital signs stable and vascular status intact. To be discharged per routine protocol.  Will follow up in approximately 1 week in the outpatient clinic.

## 2019-03-24 ENCOUNTER — Encounter: Payer: Self-pay | Admitting: *Deleted

## 2019-05-10 ENCOUNTER — Other Ambulatory Visit: Payer: Self-pay | Admitting: Family Medicine

## 2019-06-14 ENCOUNTER — Other Ambulatory Visit: Payer: Self-pay | Admitting: Family Medicine

## 2019-06-14 NOTE — Telephone Encounter (Signed)
Requested Prescriptions  Pending Prescriptions Disp Refills  . rOPINIRole (REQUIP) 1 MG tablet [Pharmacy Med Name: rOPINIRole HCl 1 MG Oral Tablet] 90 tablet 0    Sig: TAKE 1 TABLET BY MOUTH AT NIGHT     Neurology:  Parkinsonian Agents Passed - 06/14/2019 12:50 PM      Passed - Last BP in normal range    BP Readings from Last 1 Encounters:  03/23/19 (!) 114/56         Passed - Valid encounter within last 12 months    Recent Outpatient Visits          3 months ago Preop exam for internal medicine   [place], Megan P, DO   3 months ago Preop exam for internal medicine   [place], Megan P, DO   5 months ago COPD exacerbation [facility])   [place], Megan P, DO   7 months ago Chronic heel pain, left   [place], DO   8 months ago Pain, dental   [place], [place], DO

## 2019-08-10 ENCOUNTER — Other Ambulatory Visit: Payer: Self-pay | Admitting: Family Medicine

## 2019-08-11 NOTE — Telephone Encounter (Signed)
Requested Prescriptions  Pending Prescriptions Disp Refills  . pantoprazole (PROTONIX) 40 MG tablet [Pharmacy Med Name: Pantoprazole Sodium 40 MG Oral Tablet Delayed Release] 90 tablet 0    Sig: Take 1 tablet by mouth once daily     Gastroenterology: Proton Pump Inhibitors Passed - 08/10/2019  9:09 PM      Passed - Valid encounter within last 12 months    Recent Outpatient Visits          5 months ago Preop exam for internal medicine   [place], Megan P, DO   5 months ago Preop exam for internal medicine   [place], Megan P, DO   7 months ago COPD exacerbation [facility])   [place], Megan P, DO   9 months ago Chronic heel pain, left   [facility], Megan P, DO   10 months ago Pain, dental   [place], [place], DO

## 2019-08-14 ENCOUNTER — Encounter: Payer: Self-pay | Admitting: Family Medicine

## 2019-08-17 MED ORDER — CITALOPRAM HYDROBROMIDE 40 MG PO TABS: 40.0000 mg | ORAL_TABLET | Freq: Every day | ORAL | 0 refills | Status: DC

## 2019-08-17 MED ORDER — EZETIMIBE 10 MG PO TABS: 10.0000 mg | ORAL_TABLET | Freq: Every day | ORAL | 0 refills | Status: DC

## 2019-08-17 MED ORDER — ROPINIROLE HCL 2 MG PO TABS: 2.0000 mg | ORAL_TABLET | Freq: Three times a day (TID) | ORAL | 0 refills | Status: DC

## 2019-08-17 MED ORDER — FENOFIBRATE 160 MG PO TABS: 160.0000 mg | ORAL_TABLET | Freq: Every day | ORAL | 0 refills | Status: DC

## 2019-08-17 MED ORDER — GABAPENTIN 100 MG PO CAPS: 100.0000 mg | ORAL_CAPSULE | Freq: Every day | ORAL | 0 refills | Status: DC

## 2019-08-17 MED ORDER — PANTOPRAZOLE SODIUM 40 MG PO TBEC: 40.0000 mg | DELAYED_RELEASE_TABLET | Freq: Every day | ORAL | 0 refills | Status: DC

## 2019-08-17 MED ORDER — HYDROXYZINE HCL 25 MG PO TABS: 25.0000 mg | ORAL_TABLET | Freq: Three times a day (TID) | ORAL | 0 refills | Status: DC | PRN

## 2019-08-17 MED ORDER — GEMFIBROZIL 600 MG PO TABS: ORAL_TABLET | ORAL | 0 refills | Status: DC

## 2019-08-17 MED ORDER — ANORO ELLIPTA 62.5-25 MCG/INH IN AEPB: 1.0000 | INHALATION_SPRAY | Freq: Every day | RESPIRATORY_TRACT | 0 refills | Status: DC

## 2019-08-17 MED ORDER — ROPINIROLE HCL 1 MG PO TABS: 1.0000 mg | ORAL_TABLET | Freq: Every day | ORAL | 0 refills | Status: DC

## 2019-10-12 ENCOUNTER — Other Ambulatory Visit: Payer: Self-pay

## 2019-10-12 ENCOUNTER — Ambulatory Visit: Payer: Self-pay

## 2019-10-12 ENCOUNTER — Emergency Department: Payer: BC Managed Care – PPO

## 2019-10-12 DIAGNOSIS — F17210 Nicotine dependence, cigarettes, uncomplicated: Secondary | ICD-10-CM | POA: Diagnosis not present

## 2019-10-12 DIAGNOSIS — Z20822 Contact with and (suspected) exposure to covid-19: Secondary | ICD-10-CM | POA: Insufficient documentation

## 2019-10-12 DIAGNOSIS — R079 Chest pain, unspecified: Secondary | ICD-10-CM | POA: Diagnosis present

## 2019-10-12 DIAGNOSIS — J449 Chronic obstructive pulmonary disease, unspecified: Secondary | ICD-10-CM | POA: Insufficient documentation

## 2019-10-12 DIAGNOSIS — Z79899 Other long term (current) drug therapy: Secondary | ICD-10-CM | POA: Insufficient documentation

## 2019-10-12 DIAGNOSIS — R739 Hyperglycemia, unspecified: Secondary | ICD-10-CM | POA: Insufficient documentation

## 2019-10-12 LAB — BASIC METABOLIC PANEL
Anion gap: 6 (ref 5–15)
BUN: 8 mg/dL (ref 6–20)
CO2: 28 mmol/L (ref 22–32)
Calcium: 9.2 mg/dL (ref 8.9–10.3)
Chloride: 102 mmol/L (ref 98–111)
Creatinine, Ser: 0.7 mg/dL (ref 0.44–1.00)
GFR calc Af Amer: >60 mL/min (ref >60)
GFR calc non Af Amer: >60 mL/min (ref >60)
Glucose, Bld: 172 mg/dL — ABNORMAL HIGH (ref 70–99)
Potassium: 4 mmol/L (ref 3.5–5.1)
Sodium: 136 mmol/L (ref 135–145)

## 2019-10-12 LAB — CBC
HCT: 39.3 % (ref 36.0–46.0)
Hemoglobin: 13.3 g/dL (ref 12.0–15.0)
MCH: 30.2 pg (ref 26.0–34.0)
MCHC: 33.8 g/dL (ref 30.0–36.0)
MCV: 89.1 fL (ref 80.0–100.0)
Platelets: 244 10*3/uL (ref 150–400)
RBC: 4.41 MIL/uL (ref 3.87–5.11)
RDW: 13.5 % (ref 11.5–15.5)
WBC: 8.2 10*3/uL (ref 4.0–10.5)
nRBC: 0 % (ref 0.0–0.2)

## 2019-10-12 LAB — TROPONIN I (HIGH SENSITIVITY): Troponin I (High Sensitivity): 5 ng/L (ref <18)

## 2019-10-12 MED ORDER — SODIUM CHLORIDE 0.9% FLUSH: 3.0000 mL | Freq: Once | INTRAVENOUS | Status: DC

## 2019-10-12 NOTE — ED Triage Notes (Signed)
Pt arrives via POV for reports of chest pain, diaphoresis, vomiting, dizziness yesterday evening around 1800. Pt reports she called PCP but was told to come to the ED. Pt denies CP today but has felt fatigued today. PT speaking with this RN in complete sentences without shob, skin warm and dry. Denies CP at this time.

## 2019-10-13 ENCOUNTER — Emergency Department
Admission: EM | Admit: 2019-10-13 | Discharge: 2019-10-13 | Disposition: A | Discharge status: Home / Self Care | Payer: BC Managed Care – PPO | Attending: Emergency Medicine | Admitting: Emergency Medicine

## 2019-10-13 DIAGNOSIS — R079 Chest pain, unspecified: Secondary | ICD-10-CM

## 2019-10-13 DIAGNOSIS — R739 Hyperglycemia, unspecified: Secondary | ICD-10-CM

## 2019-10-13 LAB — URINALYSIS, COMPLETE (UACMP) WITH MICROSCOPIC
Bilirubin Urine: NEGATIVE
Glucose, UA: NEGATIVE mg/dL
Hgb urine dipstick: NEGATIVE
Ketones, ur: NEGATIVE mg/dL
Leukocytes,Ua: NEGATIVE
Nitrite: NEGATIVE
Protein, ur: NEGATIVE mg/dL
Specific Gravity, Urine: 1.004 — ABNORMAL LOW (ref 1.005–1.030)
pH: 5 (ref 5.0–8.0)

## 2019-10-13 LAB — TROPONIN I (HIGH SENSITIVITY): Troponin I (High Sensitivity): 5 ng/L (ref <18)

## 2019-10-13 LAB — POCT PREGNANCY, URINE: Preg Test, Ur: NEGATIVE

## 2019-10-13 LAB — SARS CORONAVIRUS 2 BY RT PCR (HOSPITAL ORDER, PERFORMED IN ~~LOC~~ HOSPITAL LAB): SARS Coronavirus 2: NEGATIVE

## 2019-10-13 MED ORDER — ASPIRIN EC 325 MG PO TBEC: 325.0000 mg | DELAYED_RELEASE_TABLET | Freq: Once | ORAL | Status: DC

## 2019-10-13 MED ORDER — ASPIRIN EC 325 MG PO TBEC
325.0000 mg | DELAYED_RELEASE_TABLET | Freq: Once | ORAL | Status: AC
  Administered 2019-10-13: 325 mg via ORAL
  Filled 2019-10-13: qty 1

## 2019-10-13 NOTE — Discharge Instructions (Addendum)
Follow-up with your primary care doctor within the next week and for a fasting blood glucose to rule out diabetes. Call your cardiology first thing Monday morning for an appointment. In the meantime, return to the emergency room if you have any chest pain or changes in your chronic shortness of breath. Make sure to take a daily baby aspirin.

## 2019-10-13 NOTE — ED Provider Notes (Signed)
[facility] Emergency Department Provider Note  ____________________________________________  Time seen: Approximately 1:07 AM  I have reviewed the triage vital signs and the nursing notes.   HISTORY  Chief Complaint Chest Pain   HPI Brandi Acevedo is a 54 y.o. female with a history of CAD status post stent to the LAD, COPD, and GERD who presents for evaluation of chest pain. Patient reports an episode of chest pain 30 hours ago. She reports that she had just gotten to work when she started having sharp central chest pain associated with nausea, one episode of vomiting. Patient was clammy and diaphoretic. She called a ride and went back home. She took her sleeping pill and went to sleep. This morning when she woke up the pain was gone. She scheduled an appointment urgent care who told her to come to the emergency room instead. She has not had any further episodes of chest pain. No changes on patient's chronic shortness of breath from COPD. No personal or family history of PE or DVT, no recent travel immobilization, no leg pain or swelling, no hemoptysis or exogenous hormones. No abdominal pain.   Past Medical History:  Diagnosis Date  . COPD (chronic obstructive pulmonary disease) ([place])   . Deaf, left   . GERD (gastroesophageal reflux disease)   . Hearing loss   . Heart attack ([place])    2007  . IBS (irritable bowel syndrome)   . Pre-diabetes   . RLS (restless legs syndrome)   . RLS (restless legs syndrome)   . Seizure ([place])    x1 - attributed to use of atorvastatin  . Self-inflicted injury 0/11/3816  . Sleep apnea    no CPAP  . Suicidal ideation 05/16/2015  . Trigger finger of left thumb   . Wears dentures    full upper  . Wears hearing aid in right ear    has, does not wear    Patient Active Problem List   Diagnosis Date Noted  . 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 Hyperlipidemia 12/18/2015  . IFG (impaired fasting glucose) 12/18/2015  .  Elevated ferritin 12/18/2015  . CTS (carpal tunnel syndrome) 12/17/2015  . Chronic obstructive pulmonary disease (COPD) ([place]) 07/18/2015  . Fracture of fifth metacarpal bone of right hand 05/21/2015  . Depression, major, single episode, severe ([place]) 05/16/2015  . Recurrent knee pain 04/25/2015  . RLS (restless legs syndrome) 02/26/2015  . Tobacco abuse 02/26/2015  . IBS (irritable bowel syndrome) 02/26/2015  . GERD (gastroesophageal reflux disease) 02/26/2015  . Sleep apnea     Past Surgical History:  Procedure Laterality Date  . ABDOMINAL HYSTERECTOMY    . ACHILLES TENDON REPAIR Right    [facility] - Dr Elvina Mattes  . ACHILLES TENDON SURGERY Left 03/23/2019   Procedure: ACHILLES REPAIR-SECONDARY LEFT;  Surgeon: Albertine Patricia, DPM;  Location: [place];  Service: Podiatry;  Laterality: Left;  LMA WITH POP BLOCK  . CALCANEAL OSTEOTOMY Left 03/23/2019   Procedure: PARTIAL CALCANECTOMY LEFT;  Surgeon: Albertine Patricia, DPM;  Location: [place];  Service: Podiatry;  Laterality: Left;  sleep apnea  . CARDIAC CATHETERIZATION  2007   LAD stent  . CARPAL TUNNEL RELEASE Right 04/23/2016   Procedure: CARPAL TUNNEL RELEASE;  Surgeon: Hessie Knows, MD;  Location: [facility];  Service: Orthopedics;  Laterality: Right;  . CESAREAN SECTION     X 3  . CHOLECYSTECTOMY    . DILATION AND CURETTAGE OF UTERUS    . HERNIA REPAIR    .  TONSILLECTOMY    . TYMPANOSTOMY TUBE PLACEMENT     X 2    Prior to Admission medications   Medication Sig Start Date End Date Taking? Authorizing Provider  citalopram (CELEXA) 40 MG tablet Take 1 tablet (40 mg total) by mouth daily. 08/17/19   Johnson, Megan P, DO  ezetimibe (ZETIA) 10 MG tablet Take 1 tablet (10 mg total) by mouth daily. 08/17/19   Johnson, Megan P, DO  fenofibrate 160 MG tablet Take 1 tablet (160 mg total) by mouth daily. 08/17/19   Johnson, Megan P, DO  fluticasone (FLONASE) 50 MCG/ACT nasal spray Place 2 sprays into both nostrils daily. 12/30/18    Johnson, Megan P, DO  gabapentin (NEURONTIN) 100 MG capsule Take 1 capsule (100 mg total) by mouth daily. 08/17/19   Johnson, Megan P, DO  gemfibrozil (LOPID) 600 MG tablet TAKE 1 TABLET BY MOUTH TWICE DAILY BEFORE A MEAL 08/17/19   Johnson, Megan P, DO  hydrOXYzine (ATARAX/VISTARIL) 25 MG tablet Take 1 tablet (25 mg total) by mouth 3 (three) times daily as needed. 08/17/19   Johnson, Megan P, DO  nicotine (NICODERM CQ) 14 mg/24hr patch Place 1 patch (14 mg total) onto the skin daily. 10/07/18   Johnson, Megan P, DO  pantoprazole (PROTONIX) 40 MG tablet Take 1 tablet (40 mg total) by mouth daily. 08/17/19   Johnson, Megan P, DO  promethazine (PHENERGAN) 12.5 MG tablet Take 1 tablet (12.5 mg total) by mouth every 6 (six) hours as needed for nausea or vomiting. 03/23/19   Troxler, Rodman Key, DPM  rOPINIRole (REQUIP) 1 MG tablet Take 1 tablet (1 mg total) by mouth at bedtime. 08/17/19   Johnson, Megan P, DO  rOPINIRole (REQUIP) 2 MG tablet Take 1 tablet (2 mg total) by mouth 3 (three) times daily. 08/17/19   Johnson, Megan P, DO  umeclidinium-vilanterol (ANORO ELLIPTA) 62.5-25 MCG/INH AEPB Inhale 1 puff into the lungs daily. 08/17/19   Park Liter P, DO    Allergies Amoxicillin-pot clavulanate, Ativan [lorazepam], Atorvastatin, Codeine, Wellbutrin [bupropion], Flagyl [metronidazole], Keflex [cephalexin], and Septra [sulfamethoxazole-trimethoprim]  Family History  Problem Relation Age of Onset  . Diabetes Mother   . Restless legs syndrome Mother   . Sleep apnea Mother   . Hypertension Mother   . Hyperlipidemia Mother   . Diabetes Father   . Hypertension Father   . Hyperlipidemia Father   . Heart disease Father   . Heart attack Father   . Diabetes Sister   . Hyperlipidemia Sister   . Hypertension Sister   . Heart disease Sister   . Heart attack Sister   . Hyperlipidemia Daughter   . Mental illness Daughter        Bipolar Disorder  . Heart disease Maternal Grandmother   . Stroke Maternal Grandfather    . Heart attack Maternal Grandfather   . Cancer Paternal Grandmother   . Hyperlipidemia Daughter     Social History Social History   Tobacco Use  . Smoking status: Current Every Day Smoker    Packs/day: 0.50    Years: 30.00    Pack years: 15.00    Types: Cigarettes  . Smokeless tobacco: Never Used  Vaping Use  . Vaping Use: Never used  Substance Use Topics  . Alcohol use: No  . Drug use: No    Review of Systems  Constitutional: Negative for fever. Eyes: Negative for visual changes. ENT: Negative for sore throat. Neck: No neck pain  Cardiovascular: + chest pain. Respiratory: Negative for shortness  of breath. Gastrointestinal: Negative for abdominal pain, vomiting or diarrhea. Genitourinary: Negative for dysuria. Musculoskeletal: Negative for back pain. Skin: Negative for rash. Neurological: Negative for headaches, weakness or numbness. Psych: No SI or HI  ____________________________________________   PHYSICAL EXAM:  VITAL SIGNS: ED Triage Vitals  Enc Vitals Group     BP 10/12/19 1754 (!) 140/68     Pulse Rate 10/12/19 1754 60     Resp 10/12/19 1754 16     Temp 10/12/19 1754 98.3 F (36.8 C)     Temp Source 10/12/19 1754 Oral     SpO2 10/12/19 1754 96 %     Weight 10/12/19 1820 182 lb (82.6 kg)     Height 10/12/19 1820 5\' 1"  (1.549 m)     Head Circumference --      Peak Flow --      Pain Score 10/12/19 1820 0     Pain Loc --      Pain Edu? --      Excl. in [place]? --     Constitutional: Alert and oriented. Well appearing and in no apparent distress. HEENT:      Head: Normocephalic and atraumatic.         Eyes: Conjunctivae are normal. Sclera is non-icteric.       Mouth/Throat: Mucous membranes are moist.       Neck: Supple with no signs of meningismus. Cardiovascular: Regular rate and rhythm. No murmurs, gallops, or rubs. 2+ symmetrical distal pulses are present in all extremities. No JVD. Respiratory: Normal respiratory effort. Lungs are clear to  auscultation bilaterally.  Gastrointestinal: Soft, non tender. Musculoskeletal: No edema, cyanosis, or erythema of extremities. Neurologic: Normal speech and language. Face is symmetric. Moving all extremities. No gross focal neurologic deficits are appreciated. Skin: Skin is warm, dry and intact. No rash noted. Psychiatric: Mood and affect are normal. Speech and behavior are normal.  ____________________________________________   LABS (all labs ordered are listed, but only abnormal results are displayed)  Labs Reviewed  BASIC METABOLIC PANEL - Abnormal; Notable for the following components:      Result Value   Glucose, Bld 172 (*)    All other components within normal limits  SARS CORONAVIRUS 2 BY RT PCR (HOSPITAL ORDER, [place] [facility])  CBC  URINALYSIS, COMPLETE (UACMP) WITH MICROSCOPIC  POC URINE PREG, ED  POCT PREGNANCY, URINE  TROPONIN I (HIGH SENSITIVITY)  TROPONIN I (HIGH SENSITIVITY)   ____________________________________________  EKG  ED ECG REPORT I, Rudene Re, the attending physician, personally viewed and interpreted this ECG.  Sinus bradycardia, rate of 59, normal intervals, normal axis, no ST elevations or depressions. Normal EKG. Unchanged from prior. ____________________________________________  RADIOLOGY  I have personally reviewed the images performed during this visit and I agree with the Radiologist's read.   Interpretation by Radiologist:  DG Chest 2 View  Result Date: 10/12/2019 CLINICAL DATA:  Chest pain. Additional history provided: Chest pain, diaphoresis, vomiting, dizziness. EXAM: CHEST - 2 VIEW COMPARISON:  Prior chest radiographs 07/14/2015 and earlier FINDINGS: Heart size within normal limits. There is no appreciable airspace consolidation. No evidence of pleural effusion or pneumothorax. No acute bony abnormality identified. IMPRESSION: No evidence of active cardiopulmonary disease. Electronically Signed   By:  Kellie Simmering DO   On: 10/12/2019 18:45     ____________________________________________   PROCEDURES  Procedure(s) performed: None Procedures Critical Care performed:  None ____________________________________________   INITIAL IMPRESSION / ASSESSMENT AND PLAN / ED COURSE  54 y.o.  female with a history of CAD status post stent to the LAD, COPD, and GERD who presents for evaluation of chest pain. Patient with an episode of chest pain greater than 30 hours ago. Has not had any further episodes. She obviously high risk for CAD with a history of STEMI and stenting to the LAD. Her EKG here is normal. She had 2 high-sensitivity troponins that were negative. Chest x-ray showing no acute findings, confirmed by radiology. No evidence of pneumonia or pneumothorax. Low suspicion for PE with resolved symptoms, no tachypnea, tachycardia, hypoxia. Labs showing elevated glucose of 172, patient has no history of diabetes. Recommended follow-up with PCP for fasting blood glucose. Discussed admission versus outpatient close follow-up with cardiology to rule out any blockage. Since patient has been pain-free for greater than 30 hours and has had negative work-up here she would prefer to go home and follow-up with her cardiologist. Recommended that she calls the office on Monday. Recommended going on a baby aspirin daily until she sees her cardiologist. Recommended return to the emergency room for any chest pain or shortness of breath that that is changed from her baseline. Old medical records reviewed.     _____________________________________________ Please note:  Patient was evaluated in Emergency Department today for the symptoms described in the history of present illness. Patient was evaluated in the context of the global COVID-19 pandemic, which necessitated consideration that the patient might be at risk for infection with the SARS-CoV-2 virus that causes COVID-19. Institutional protocols and algorithms  that pertain to the evaluation of patients at risk for COVID-19 are in a state of rapid change based on information released by regulatory bodies including the CDC and federal and state organizations. These policies and algorithms were followed during the patient's care in the ED.  Some ED evaluations and interventions may be delayed as a result of limited staffing during the pandemic.   [place] Controlled Substance Database was reviewed by me. ____________________________________________   FINAL CLINICAL IMPRESSION(S) / ED DIAGNOSES   Final diagnoses:  Chest pain, unspecified type  Hyperglycemia      NEW MEDICATIONS STARTED DURING THIS VISIT:  ED Discharge Orders    None       Note:  This document was prepared using Dragon voice recognition software and may include unintentional dictation errors.    Alfred Levins, [place], MD 10/13/19 [phone]

## 2019-11-13 ENCOUNTER — Encounter: Payer: Self-pay | Admitting: Family Medicine

## 2019-11-13 ENCOUNTER — Ambulatory Visit: Payer: BC Managed Care – PPO | Admitting: Family Medicine

## 2019-11-13 ENCOUNTER — Other Ambulatory Visit: Payer: Self-pay

## 2019-11-13 VITALS — BP 123/77 | HR 59 | Temp 98.8°F | Wt 179.2 lb

## 2019-11-13 DIAGNOSIS — K219 Gastro-esophageal reflux disease without esophagitis: Secondary | ICD-10-CM | POA: Diagnosis not present

## 2019-11-13 DIAGNOSIS — G2581 Restless legs syndrome: Secondary | ICD-10-CM | POA: Diagnosis not present

## 2019-11-13 DIAGNOSIS — J449 Chronic obstructive pulmonary disease, unspecified: Secondary | ICD-10-CM

## 2019-11-13 DIAGNOSIS — F322 Major depressive disorder, single episode, severe without psychotic features: Secondary | ICD-10-CM

## 2019-11-13 DIAGNOSIS — E1149 Type 2 diabetes mellitus with other diabetic neurological complication: Secondary | ICD-10-CM | POA: Insufficient documentation

## 2019-11-13 DIAGNOSIS — E782 Mixed hyperlipidemia: Secondary | ICD-10-CM

## 2019-11-13 DIAGNOSIS — R7301 Impaired fasting glucose: Secondary | ICD-10-CM | POA: Diagnosis not present

## 2019-11-13 DIAGNOSIS — R7989 Other specified abnormal findings of blood chemistry: Secondary | ICD-10-CM

## 2019-11-13 LAB — BAYER DCA HB A1C WAIVED: HB A1C (BAYER DCA - WAIVED): 6.6 % (ref <7.0)

## 2019-11-13 MED ORDER — ROPINIROLE HCL 3 MG PO TABS: 3.0000 mg | ORAL_TABLET | Freq: Every day | ORAL | 1 refills | Status: DC

## 2019-11-13 MED ORDER — GEMFIBROZIL 600 MG PO TABS: ORAL_TABLET | ORAL | 0 refills | Status: AC

## 2019-11-13 MED ORDER — CITALOPRAM HYDROBROMIDE 40 MG PO TABS: 40.0000 mg | ORAL_TABLET | Freq: Every day | ORAL | 0 refills | Status: DC

## 2019-11-13 MED ORDER — HYDROXYZINE HCL 25 MG PO TABS: 25.0000 mg | ORAL_TABLET | Freq: Three times a day (TID) | ORAL | 0 refills | Status: AC | PRN

## 2019-11-13 MED ORDER — PANTOPRAZOLE SODIUM 40 MG PO TBEC: 40.0000 mg | DELAYED_RELEASE_TABLET | Freq: Every day | ORAL | 0 refills | Status: AC

## 2019-11-13 MED ORDER — FENOFIBRATE 160 MG PO TABS: 160.0000 mg | ORAL_TABLET | Freq: Every day | ORAL | 0 refills | Status: DC

## 2019-11-13 MED ORDER — EZETIMIBE 10 MG PO TABS: 10.0000 mg | ORAL_TABLET | Freq: Every day | ORAL | 0 refills | Status: DC

## 2019-11-13 NOTE — Assessment & Plan Note (Signed)
Under good control on current regimen. Continue current regimen. Continue to monitor. Call with any concerns. Refills given. Labs drawn today.   

## 2019-11-13 NOTE — Assessment & Plan Note (Signed)
A1c 6.6. Newly diagnosed. Will watch diet and exercise. Recheck 3 months. Call with any concerns.

## 2019-11-13 NOTE — Patient Instructions (Signed)

## 2019-11-13 NOTE — Assessment & Plan Note (Signed)
Not doing well. Cannot tolerate gabapentin. Never followed up with neurology after her EMG in 2018. Will get her back in to see them. Await their input. Continue requip. Stop gabapentin.

## 2019-11-13 NOTE — Progress Notes (Signed)
BP 123/77 (BP Location: Left Arm, Patient Position: Sitting, Cuff Size: Normal)   Pulse (!) 59   Temp 98.8 F (37.1 C) (Oral)   Wt 179 lb 3.2 oz (81.3 kg)   SpO2 98%   BMI 33.86 kg/m    Subjective:    Patient ID: Brandi Acevedo, female    DOB: [birth year], 54 y.o.   MRN: [id number]  HPI: Brandi Acevedo is a 54 y.o. female  Chief Complaint  Patient presents with  . Depression  . RLS  . Gastroesophageal Reflux  . Hyperlipidemia   Impaired Fasting Glucose HbA1C:  Lab Results  Component Value Date   HGBA1C 6.3 10/07/2018   Duration of elevated blood sugar:  Polydipsia: no Polyuria: no Weight change: no Visual disturbance: no Glucose Monitoring: no Diabetic Education: Not Completed Family history of diabetes: yes  HYPERLIPIDEMIA Hyperlipidemia status: excellent compliance Satisfied with current treatment?  yes Side effects:  no Medication compliance: excellent compliance Past cholesterol meds: zetia, gemfibrozil. fenofibrate Supplements: none Aspirin:  no The ASCVD Risk score Mikey Bussing DC Jr., et al., 2013) failed to calculate for the following reasons:   The patient has a prior MI or stroke diagnosis Chest pain:  no Coronary artery disease:  no  DEPRESSION Mood status: controlled Satisfied with current treatment?: yes Symptom severity: mild  Duration of current treatment : chronic Side effects: no Medication compliance: excellent compliance Psychotherapy/counseling: no  Previous psychiatric medications: celexa Depressed mood: no Anxious mood: no Anhedonia: no Significant weight loss or gain: no Insomnia: no  Fatigue: yes Feelings of worthlessness or guilt: no Impaired concentration/indecisiveness: no Suicidal ideations: no Hopelessness: no Crying spells: no Depression screen [facility] 2/9 11/13/2019 02/17/2019 07/05/2017 12/18/2016 11/18/2016  Decreased Interest 1 1 1 1 1   Down, Depressed, Hopeless 0 1 1 1 1   PHQ - 2 Score 1 2 2 2 2   Altered sleeping 3 1  3 2 1   Tired, decreased energy 1 1 2 2 2   Change in appetite 1 1 1  0 1  Feeling bad or failure about yourself  0 1 0 1 1  Trouble concentrating 0 1 0 1 0  Moving slowly or fidgety/restless 0 0 0 1 0  Suicidal thoughts 0 0 0 0 0  PHQ-9 Score 6 7 8 9 7   Difficult doing work/chores - Not difficult at all Somewhat difficult Somewhat difficult Somewhat difficult   GERD GERD control status: stable  Satisfied with current treatment? yes Heartburn frequency:  Medication side effects: no  Medication compliance: stable Dysphagia: no Odynophagia:  no Hematemesis: no Blood in stool: no EGD: no  RLS has been doing worse. She notes that .   Relevant past medical, surgical, family and social history reviewed and updated as indicated. Interim medical history since our last visit reviewed. Allergies and medications reviewed and updated.  Review of Systems  Constitutional: Negative.   HENT: Negative.   Respiratory: Negative.   Cardiovascular: Negative.   Gastrointestinal: Negative.   Musculoskeletal: Negative.   Neurological: Negative.   Hematological: Negative.   Psychiatric/Behavioral: Negative.     Per HPI unless specifically indicated above     Objective:    BP 123/77 (BP Location: Left Arm, Patient Position: Sitting, Cuff Size: Normal)   Pulse (!) 59   Temp 98.8 F (37.1 C) (Oral)   Wt 179 lb 3.2 oz (81.3 kg)   SpO2 98%   BMI 33.86 kg/m   Wt Readings from Last 3 Encounters:  11/13/19 179 lb 3.2 oz (81.3  kg)  10/12/19 182 lb (82.6 kg)  03/23/19 174 lb (78.9 kg)    Physical Exam Vitals and nursing note reviewed.  Constitutional:      General: She is not in acute distress.    Appearance: Normal appearance. She is not ill-appearing, toxic-appearing or diaphoretic.  HENT:     Head: Normocephalic and atraumatic.     Right Ear: External ear normal.     Left Ear: External ear normal.     Nose: Nose normal.     Mouth/Throat:     Mouth: Mucous membranes are moist.      Pharynx: Oropharynx is clear.  Eyes:     General: No scleral icterus.       Right eye: No discharge.        Left eye: No discharge.     Extraocular Movements: Extraocular movements intact.     Conjunctiva/sclera: Conjunctivae normal.     Pupils: Pupils are equal, round, and reactive to light.  Cardiovascular:     Rate and Rhythm: Normal rate and regular rhythm.     Pulses: Normal pulses.     Heart sounds: Normal heart sounds. No murmur heard.  No friction rub. No gallop.   Pulmonary:     Effort: Pulmonary effort is normal. No respiratory distress.     Breath sounds: Normal breath sounds. No stridor. No wheezing, rhonchi or rales.  Chest:     Chest wall: No tenderness.  Musculoskeletal:        General: Normal range of motion.     Cervical back: Normal range of motion and neck supple.  Skin:    General: Skin is warm and dry.     Capillary Refill: Capillary refill takes less than 2 seconds.     Coloration: Skin is not jaundiced or pale.     Findings: No bruising, erythema, lesion or rash.  Neurological:     General: No focal deficit present.     Mental Status: She is alert and oriented to person, place, and time. Mental status is at baseline.  Psychiatric:        Mood and Affect: Mood normal.        Behavior: Behavior normal.        Thought Content: Thought content normal.        Judgment: Judgment normal.     Results for orders placed or performed during the hospital encounter of 10/13/19  SARS Coronavirus 2 by RT PCR (hospital order, performed in [facility] hospital lab) Nasopharyngeal Nasopharyngeal Swab   Specimen: Nasopharyngeal Swab  Result Value Ref Range   SARS Coronavirus 2 NEGATIVE NEGATIVE  Basic metabolic panel  Result Value Ref Range   Sodium 136 135 - 145 mmol/L   Potassium 4.0 3.5 - 5.1 mmol/L   Chloride 102 98 - 111 mmol/L   CO2 28 22 - 32 mmol/L   Glucose, Bld 172 (H) 70 - 99 mg/dL   BUN 8 6 - 20 mg/dL   Creatinine, Ser 0.70 0.44 - 1.00 mg/dL    Calcium 9.2 8.9 - 10.3 mg/dL   GFR calc non Af Amer >60 >60 mL/min   GFR calc Af Amer >60 >60 mL/min   Anion gap 6 5 - 15  CBC  Result Value Ref Range   WBC 8.2 4.0 - 10.5 K/uL   RBC 4.41 3.87 - 5.11 MIL/uL   Hemoglobin 13.3 12.0 - 15.0 g/dL   HCT 39.3 36 - 46 %   MCV 89.1 80.0 - 100.0  fL   MCH 30.2 26.0 - 34.0 pg   MCHC 33.8 30.0 - 36.0 g/dL   RDW 13.5 11.5 - 15.5 %   Platelets 244 150 - 400 K/uL   nRBC 0.0 0.0 - 0.2 %  Urinalysis, Complete w Microscopic  Result Value Ref Range   Color, Urine YELLOW (A) YELLOW   APPearance CLEAR (A) CLEAR   Specific Gravity, Urine 1.004 (L) 1.005 - 1.030   pH 5.0 5.0 - 8.0   Glucose, UA NEGATIVE NEGATIVE mg/dL   Hgb urine dipstick NEGATIVE NEGATIVE   Bilirubin Urine NEGATIVE NEGATIVE   Ketones, ur NEGATIVE NEGATIVE mg/dL   Protein, ur NEGATIVE NEGATIVE mg/dL   Nitrite NEGATIVE NEGATIVE   Leukocytes,Ua NEGATIVE NEGATIVE   RBC / HPF 0-5 0 - 5 RBC/hpf   WBC, UA 0-5 0 - 5 WBC/hpf   Bacteria, UA FEW (A) NONE SEEN   Squamous Epithelial / LPF 0-5 0 - 5  Pregnancy, urine POC  Result Value Ref Range   Preg Test, Ur NEGATIVE NEGATIVE  Troponin I (High Sensitivity)  Result Value Ref Range   Troponin I (High Sensitivity) 5 <18 ng/L  Troponin I (High Sensitivity)  Result Value Ref Range   Troponin I (High Sensitivity) 5 <18 ng/L      Assessment & Plan:   Problem List Items Addressed This Visit      Respiratory   Chronic obstructive pulmonary disease (COPD) ([place]) - Primary    Under good control on current regimen. Continue current regimen. Continue to monitor. Call with any concerns. Refills given. Labs drawn today.       Relevant Orders   CBC with Differential/Platelet   Comprehensive metabolic panel     Digestive   GERD (gastroesophageal reflux disease)    Under good control on current regimen. Continue current regimen. Continue to monitor. Call with any concerns. Refills given. Labs drawn today.       Relevant Medications    pantoprazole (PROTONIX) 40 MG tablet   Other Relevant Orders   CBC with Differential/Platelet   Comprehensive metabolic panel     Endocrine   Type 2 diabetes mellitus with neurological complications (HCC)    T6R 6.6. Newly diagnosed. Will watch diet and exercise. Recheck 3 months. Call with any concerns.       RESOLVED: IFG (impaired fasting glucose)    A1c of 6.6- will resolve. Now DM      Relevant Orders   CBC with Differential/Platelet   Bayer DCA Hb A1c Waived   Comprehensive metabolic panel     Other   RLS (restless legs syndrome)    Not doing well. Cannot tolerate gabapentin. Never followed up with neurology after her EMG in 2018. Will get her back in to see them. Await their input. Continue requip. Stop gabapentin.       Relevant Orders   CBC with Differential/Platelet   Comprehensive metabolic panel   Ambulatory referral to Neurology   Depression, major, single episode, severe ([place])    Under good control on current regimen. Continue current regimen. Continue to monitor. Call with any concerns. Refills given. Labs drawn today.       Relevant Medications   hydrOXYzine (ATARAX/VISTARIL) 25 MG tablet   citalopram (CELEXA) 40 MG tablet   Other Relevant Orders   CBC with Differential/Platelet   Comprehensive metabolic panel   Hyperlipidemia    Under good control on current regimen. Continue current regimen. Continue to monitor. Call with any concerns. Refills given. Labs drawn today.  Relevant Medications   gemfibrozil (LOPID) 600 MG tablet   fenofibrate 160 MG tablet   ezetimibe (ZETIA) 10 MG tablet   Other Relevant Orders   CBC with Differential/Platelet   Comprehensive metabolic panel   Lipid Panel w/o Chol/HDL Ratio   Elevated ferritin    Rechecking labs today. Await results. Treat as needed.       Relevant Orders   CBC with Differential/Platelet   Comprehensive metabolic panel   Ferritin       Follow up plan: Return in about 3 months (around  02/13/2020) for Physical.

## 2019-11-13 NOTE — Assessment & Plan Note (Signed)
A1c of 6.6- will resolve. Now DM

## 2019-11-13 NOTE — Assessment & Plan Note (Signed)
Rechecking labs today. Await results. Treat as needed.  °

## 2019-11-14 LAB — CBC WITH DIFFERENTIAL/PLATELET
Basophils Absolute: 0.2 10*3/uL (ref 0.0–0.2)
Basos: 2 %
EOS (ABSOLUTE): 0.4 10*3/uL (ref 0.0–0.4)
Eos: 4 %
Hematocrit: 39.4 % (ref 34.0–46.6)
Hemoglobin: 13 g/dL (ref 11.1–15.9)
Immature Grans (Abs): 0 10*3/uL (ref 0.0–0.1)
Immature Granulocytes: 0 %
Lymphocytes Absolute: 3.2 10*3/uL — ABNORMAL HIGH (ref 0.7–3.1)
Lymphs: 40 %
MCH: 29.9 pg (ref 26.6–33.0)
MCHC: 33 g/dL (ref 31.5–35.7)
MCV: 91 fL (ref 79–97)
Monocytes Absolute: 0.5 10*3/uL (ref 0.1–0.9)
Monocytes: 6 %
Neutrophils Absolute: 3.8 10*3/uL (ref 1.4–7.0)
Neutrophils: 48 %
Platelets: 284 10*3/uL (ref 150–450)
RBC: 4.35 x10E6/uL (ref 3.77–5.28)
RDW: 13.2 % (ref 11.7–15.4)
WBC: 8.1 10*3/uL (ref 3.4–10.8)

## 2019-11-14 LAB — COMPREHENSIVE METABOLIC PANEL
ALT: 51 IU/L — ABNORMAL HIGH (ref 0–32)
AST: 39 IU/L (ref 0–40)
Albumin/Globulin Ratio: 1.5 (ref 1.2–2.2)
Albumin: 4.2 g/dL (ref 3.8–4.9)
Alkaline Phosphatase: 82 IU/L (ref 48–121)
BUN/Creatinine Ratio: 9 (ref 9–23)
BUN: 6 mg/dL (ref 6–24)
Bilirubin Total: 0.4 mg/dL (ref 0.0–1.2)
CO2: 25 mmol/L (ref 20–29)
Calcium: 8.6 mg/dL — ABNORMAL LOW (ref 8.7–10.2)
Chloride: 102 mmol/L (ref 96–106)
Creatinine, Ser: 0.7 mg/dL (ref 0.57–1.00)
GFR calc Af Amer: 114 mL/min/{1.73_m2} (ref >59)
GFR calc non Af Amer: 99 mL/min/{1.73_m2} (ref >59)
Globulin, Total: 2.8 g/dL (ref 1.5–4.5)
Glucose: 122 mg/dL — ABNORMAL HIGH (ref 65–99)
Potassium: 4.2 mmol/L (ref 3.5–5.2)
Sodium: 140 mmol/L (ref 134–144)
Total Protein: 7 g/dL (ref 6.0–8.5)

## 2019-11-14 LAB — LIPID PANEL W/O CHOL/HDL RATIO
Cholesterol, Total: 178 mg/dL (ref 100–199)
HDL: 32 mg/dL — ABNORMAL LOW (ref >39)
LDL Chol Calc (NIH): 119 mg/dL — ABNORMAL HIGH (ref 0–99)
Triglycerides: 153 mg/dL — ABNORMAL HIGH (ref 0–149)
VLDL Cholesterol Cal: 27 mg/dL (ref 5–40)

## 2019-11-14 LAB — FERRITIN: Ferritin: 344 ng/mL — ABNORMAL HIGH (ref 15–150)

## 2019-11-22 ENCOUNTER — Telehealth: Payer: Self-pay

## 2019-11-22 ENCOUNTER — Other Ambulatory Visit: Payer: Self-pay | Admitting: Podiatry

## 2019-11-22 NOTE — Telephone Encounter (Signed)
PA for Pantoprazole initiated and approved via Cover My Meds. Key: Fabian Sharp

## 2019-11-24 ENCOUNTER — Other Ambulatory Visit: Payer: Self-pay | Admitting: Family Medicine

## 2019-11-24 NOTE — Telephone Encounter (Signed)
Routing to provider  

## 2019-11-24 NOTE — Telephone Encounter (Signed)
All of these were sent in 1 week ago. I don't understand.

## 2019-11-28 ENCOUNTER — Other Ambulatory Visit: Payer: BC Managed Care – PPO

## 2019-12-04 ENCOUNTER — Other Ambulatory Visit: Payer: BLUE CROSS/BLUE SHIELD

## 2019-12-05 ENCOUNTER — Encounter: Payer: Self-pay | Admitting: Family Medicine

## 2019-12-29 ENCOUNTER — Other Ambulatory Visit
Admission: RE | Admit: 2019-12-29 | Discharge: 2019-12-29 | Disposition: A | Discharge status: Home / Self Care | Payer: BLUE CROSS/BLUE SHIELD | Source: Ambulatory Visit | Attending: Cardiology | Admitting: Cardiology

## 2019-12-29 ENCOUNTER — Other Ambulatory Visit: Payer: Self-pay | Admitting: Podiatry

## 2019-12-29 ENCOUNTER — Encounter
Admission: RE | Admit: 2019-12-29 | Discharge: 2019-12-29 | Disposition: A | Discharge status: Home / Self Care | Payer: BLUE CROSS/BLUE SHIELD | Source: Ambulatory Visit | Attending: Podiatry | Admitting: Podiatry

## 2019-12-29 ENCOUNTER — Other Ambulatory Visit: Payer: Self-pay

## 2019-12-29 DIAGNOSIS — Z01812 Encounter for preprocedural laboratory examination: Secondary | ICD-10-CM | POA: Insufficient documentation

## 2019-12-29 DIAGNOSIS — Z20822 Contact with and (suspected) exposure to covid-19: Secondary | ICD-10-CM | POA: Insufficient documentation

## 2019-12-29 HISTORY — DX: Type 2 diabetes mellitus without complications: E11.9

## 2019-12-29 HISTORY — DX: Angina pectoris, unspecified: I20.9

## 2019-12-29 HISTORY — DX: Depression, unspecified: F32.A

## 2019-12-29 HISTORY — DX: Anxiety disorder, unspecified: F41.9

## 2019-12-29 LAB — SARS CORONAVIRUS 2 (TAT 6-24 HRS): SARS Coronavirus 2: NEGATIVE

## 2019-12-29 NOTE — Patient Instructions (Addendum)
Your procedure is scheduled on: 01/05/20- FRIDAY  Report to Day Surgery on the 2nd floor of the [facility].  To find out your arrival time, please call [phone] between 1PM - 3PM on: 01/04/20 -  THURSDAY  REMEMBER: Instructions that are not followed completely may result in serious medical risk, up to and including death; or upon the discretion of your surgeon and anesthesiologist your surgery may need to be rescheduled.  Do not eat food after midnight the night before surgery.  No gum chewing, lozengers or hard candies.  You may however, drink CLEAR liquids up to 2 hours before you are scheduled to arrive for your surgery. Do not drink anything within 2 hours of your scheduled arrival time.  Type 1 and Type 2 diabetics should only drink water.  In addition, your doctor has ordered for you to drink the provided  Gatorade G2 ,  drinking this carbohydrate drink up to two hours before surgery helps to reduce insulin resistance and improve patient outcomes. Please complete drinking 2 hours prior to scheduled arrival time.  TAKE THESE MEDICATIONS THE MORNING OF SURGERY WITH A SIP OF WATER:  -Pantoprazole (PROTONIX) 40 MG tablet take one the night before and one on the morning of surgery - helps to prevent nausea after surgery. - ROPINIRole (REQUIP)   One week prior to surgery: Stop Anti-inflammatories (NSAIDS) such as Advil, Aleve, Ibuprofen, Motrin, Naproxen, Naprosyn and Aspirin based products such as Excedrin, Goodys Powder, BC Powder.  Stop ANY OVER THE COUNTER supplements until after surgery. (You may continue taking Tylenol, Vitamin D, Vitamin B, and multivitamin.)  No Alcohol for 24 hours before or after surgery.  No Smoking including e-cigarettes for 24 hours prior to surgery.  No chewable tobacco products for at least 6 hours prior to surgery.  No nicotine patches on the day of surgery.  Do not use any "recreational" drugs for at least a week prior to your surgery.   Please be advised that the combination of cocaine and anesthesia may have negative outcomes, up to and including death. If you test positive for cocaine, your surgery will be cancelled.  On the morning of surgery brush your teeth with toothpaste and water, you may rinse your mouth with mouthwash if you wish. Do not swallow any toothpaste or mouthwash.  Do not wear jewelry, make-up, hairpins, clips or nail polish.  Do not wear lotions, powders, or perfumes.   Do not shave 48 hours prior to surgery.   Contact lenses, hearing aids and dentures may not be worn into surgery.  Do not bring valuables to the hospital. [facility] is not responsible for any missing/lost belongings or valuables.   Use CHG Soap or wipes as directed on instruction sheet.  Notify your doctor if there is any change in your medical condition (cold, fever, infection).  Wear comfortable clothing (specific to your surgery type) to the hospital.  Plan for stool softeners for home use; pain medications have a tendency to cause constipation. You can also help prevent constipation by eating foods high in fiber such as fruits and vegetables and drinking plenty of fluids as your diet allows.  After surgery, you can help prevent lung complications by doing breathing exercises.  Take deep breaths and cough every 1-2 hours. Your doctor may order a device called an Incentive Spirometer to help you take deep breaths. When coughing or sneezing, hold a pillow firmly against your incision with both hands. This is called "splinting." Doing this helps protect  your incision. It also decreases belly discomfort.  If you are being admitted to the hospital overnight, leave your suitcase in the car. After surgery it may be brought to your room.  If you are being discharged the day of surgery, you will not be allowed to drive home. You will need a responsible adult (18 years or older) to drive you home and stay with you that night.   If  you are taking public transportation, you will need to have a responsible adult (18 years or older) with you. Please confirm with your physician that it is acceptable to use public transportation.   Please call the [facility] at [phone] if you have any questions about these instructions.  Visitation Policy:  Patients undergoing a surgery or procedure may have one family member or support person with them as long as that person is not COVID-19 positive or experiencing its symptoms.  That person may remain in the waiting area during the procedure.  Inpatient Visitation Update:   In an effort to ensure the safety of our team members and our patients, we are implementing a change to our visitation policy:  Effective Monday, Aug. 9, at 7 a.m., inpatients will be allowed one support person.  o The support person may change daily.  o The support person must pass our screening, gel in and out, and wear a mask at all times, including in the patient's room.  o Patients must also wear a mask when staff or their support person are in the room.  o Masking is required regardless of vaccination status.  Systemwide, no visitors 17 or younger.

## 2020-01-02 ENCOUNTER — Encounter: Payer: Self-pay | Admitting: Urgent Care

## 2020-01-02 ENCOUNTER — Observation Stay
Admission: RE | Admit: 2020-01-02 | Discharge: 2020-01-03 | Disposition: A | Discharge status: Home / Self Care | Payer: BLUE CROSS/BLUE SHIELD | Source: Ambulatory Visit | Attending: Cardiology | Admitting: Cardiology

## 2020-01-02 ENCOUNTER — Encounter: Payer: Self-pay | Admitting: Cardiology

## 2020-01-02 ENCOUNTER — Other Ambulatory Visit: Payer: Self-pay

## 2020-01-02 ENCOUNTER — Encounter
Admission: RE | Disposition: A | Discharge status: Home / Self Care | Payer: Self-pay | Source: Ambulatory Visit | Attending: Cardiology

## 2020-01-02 ENCOUNTER — Encounter: Payer: Self-pay | Admitting: Podiatry

## 2020-01-02 DIAGNOSIS — R9439 Abnormal result of other cardiovascular function study: Secondary | ICD-10-CM

## 2020-01-02 DIAGNOSIS — F17210 Nicotine dependence, cigarettes, uncomplicated: Secondary | ICD-10-CM | POA: Diagnosis not present

## 2020-01-02 DIAGNOSIS — I2510 Atherosclerotic heart disease of native coronary artery without angina pectoris: Secondary | ICD-10-CM | POA: Diagnosis not present

## 2020-01-02 DIAGNOSIS — Z7902 Long term (current) use of antithrombotics/antiplatelets: Secondary | ICD-10-CM | POA: Diagnosis not present

## 2020-01-02 DIAGNOSIS — R079 Chest pain, unspecified: Secondary | ICD-10-CM | POA: Diagnosis present

## 2020-01-02 HISTORY — PX: CORONARY STENT INTERVENTION: CATH118234

## 2020-01-02 HISTORY — PX: LEFT HEART CATH AND CORONARY ANGIOGRAPHY: CATH118249

## 2020-01-02 LAB — POCT ACTIVATED CLOTTING TIME
Activated Clotting Time: 279 seconds
Activated Clotting Time: 285 seconds
Activated Clotting Time: 296 seconds

## 2020-01-02 SURGERY — LEFT HEART CATH AND CORONARY ANGIOGRAPHY
Anesthesia: Moderate Sedation

## 2020-01-02 MED ORDER — ALBUTEROL SULFATE (2.5 MG/3ML) 0.083% IN NEBU: 2.5000 mg | INHALATION_SOLUTION | RESPIRATORY_TRACT | Status: DC | PRN

## 2020-01-02 MED ORDER — DICYCLOMINE HCL 20 MG PO TABS
20.0000 mg | ORAL_TABLET | Freq: Every morning | ORAL | Status: DC
  Administered 2020-01-03: 20 mg via ORAL
  Filled 2020-01-02: qty 1

## 2020-01-02 MED ORDER — HEPARIN (PORCINE) IN NACL 1000-0.9 UT/500ML-% IV SOLN
INTRAVENOUS | Status: AC
  Filled 2020-01-02: qty 1000

## 2020-01-02 MED ORDER — ASPIRIN 81 MG PO CHEW
81.0000 mg | CHEWABLE_TABLET | Freq: Every day | ORAL | Status: DC
  Administered 2020-01-03: 81 mg via ORAL
  Filled 2020-01-02: qty 1

## 2020-01-02 MED ORDER — EZETIMIBE 10 MG PO TABS
10.0000 mg | ORAL_TABLET | Freq: Every day | ORAL | Status: DC
  Administered 2020-01-03: 10 mg via ORAL
  Filled 2020-01-02: qty 1

## 2020-01-02 MED ORDER — MIDAZOLAM HCL 2 MG/ML PO SYRP
ORAL_SOLUTION | ORAL | Status: AC
  Administered 2020-01-02: 2 mg
  Filled 2020-01-02: qty 4

## 2020-01-02 MED ORDER — SODIUM CHLORIDE 0.9% FLUSH: 3.0000 mL | INTRAVENOUS | Status: DC | PRN

## 2020-01-02 MED ORDER — HEPARIN (PORCINE) IN NACL 1000-0.9 UT/500ML-% IV SOLN
INTRAVENOUS | Status: DC | PRN
  Administered 2020-01-02: 500 mL

## 2020-01-02 MED ORDER — SODIUM CHLORIDE 0.9% FLUSH: 3.0000 mL | Freq: Two times a day (BID) | INTRAVENOUS | Status: DC

## 2020-01-02 MED ORDER — HYDROXYZINE HCL 25 MG PO TABS
25.0000 mg | ORAL_TABLET | Freq: Three times a day (TID) | ORAL | Status: DC | PRN
  Administered 2020-01-02: 25 mg via ORAL
  Filled 2020-01-02 (×2): qty 1

## 2020-01-02 MED ORDER — LABETALOL HCL 5 MG/ML IV SOLN: 10.0000 mg | INTRAVENOUS | Status: AC | PRN

## 2020-01-02 MED ORDER — VERAPAMIL HCL 2.5 MG/ML IV SOLN
INTRAVENOUS | Status: AC
  Filled 2020-01-02: qty 2

## 2020-01-02 MED ORDER — ONDANSETRON HCL 4 MG/2ML IJ SOLN: 4.0000 mg | Freq: Four times a day (QID) | INTRAMUSCULAR | Status: DC | PRN

## 2020-01-02 MED ORDER — SODIUM CHLORIDE 0.9 % WEIGHT BASED INFUSION
1.0000 mL/kg/h | INTRAVENOUS | Status: AC
  Administered 2020-01-02: 1 mL/kg/h via INTRAVENOUS

## 2020-01-02 MED ORDER — HEPARIN SODIUM (PORCINE) 1000 UNIT/ML IJ SOLN
INTRAMUSCULAR | Status: AC
  Filled 2020-01-02: qty 1

## 2020-01-02 MED ORDER — SODIUM CHLORIDE 0.9 % IV SOLN: 250.0000 mL | INTRAVENOUS | Status: DC | PRN

## 2020-01-02 MED ORDER — ROPINIROLE HCL 1 MG PO TABS
1.0000 mg | ORAL_TABLET | Freq: Three times a day (TID) | ORAL | Status: DC
  Administered 2020-01-02 (×2): 1 mg via ORAL
  Filled 2020-01-02 (×3): qty 1

## 2020-01-02 MED ORDER — FENOFIBRATE 160 MG PO TABS
160.0000 mg | ORAL_TABLET | Freq: Every day | ORAL | Status: DC
  Administered 2020-01-03: 160 mg via ORAL
  Filled 2020-01-02: qty 1

## 2020-01-02 MED ORDER — INFLUENZA VAC SPLIT QUAD 0.5 ML IM SUSY: 0.5000 mL | PREFILLED_SYRINGE | INTRAMUSCULAR | Status: DC

## 2020-01-02 MED ORDER — CITALOPRAM HYDROBROMIDE 20 MG PO TABS
40.0000 mg | ORAL_TABLET | Freq: Every day | ORAL | Status: DC
  Filled 2020-01-02: qty 2

## 2020-01-02 MED ORDER — IOHEXOL 300 MG/ML  SOLN
INTRAMUSCULAR | Status: DC | PRN
  Administered 2020-01-02: 215 mL

## 2020-01-02 MED ORDER — SODIUM CHLORIDE 0.9 % WEIGHT BASED INFUSION
1.0000 mL/kg/h | INTRAVENOUS | Status: DC
  Administered 2020-01-02: 1 mL/kg/h via INTRAVENOUS

## 2020-01-02 MED ORDER — SODIUM CHLORIDE 0.9 % WEIGHT BASED INFUSION
3.0000 mL/kg/h | INTRAVENOUS | Status: AC
  Administered 2020-01-02: 3 mL/kg/h via INTRAVENOUS

## 2020-01-02 MED ORDER — FENTANYL CITRATE (PF) 100 MCG/2ML IJ SOLN
INTRAMUSCULAR | Status: DC | PRN
Start: 2020-01-02 — End: 2020-01-02
  Administered 2020-01-02 (×4): 25 ug via INTRAVENOUS

## 2020-01-02 MED ORDER — GEMFIBROZIL 600 MG PO TABS
600.0000 mg | ORAL_TABLET | Freq: Two times a day (BID) | ORAL | Status: DC
  Administered 2020-01-03: 600 mg via ORAL
  Filled 2020-01-02 (×3): qty 1

## 2020-01-02 MED ORDER — NITROGLYCERIN 1 MG/10 ML FOR IR/CATH LAB
INTRA_ARTERIAL | Status: DC | PRN
  Administered 2020-01-02 (×2): 200 ug via INTRACORONARY

## 2020-01-02 MED ORDER — TICAGRELOR 90 MG PO TABS
ORAL_TABLET | ORAL | Status: AC
  Filled 2020-01-02: qty 2

## 2020-01-02 MED ORDER — GABAPENTIN 100 MG PO CAPS
100.0000 mg | ORAL_CAPSULE | Freq: Every day | ORAL | Status: DC
  Administered 2020-01-02: 100 mg via ORAL
  Filled 2020-01-02 (×2): qty 1

## 2020-01-02 MED ORDER — ASPIRIN 81 MG PO CHEW
81.0000 mg | CHEWABLE_TABLET | ORAL | Status: AC
  Administered 2020-01-02: 81 mg via ORAL

## 2020-01-02 MED ORDER — PANTOPRAZOLE SODIUM 40 MG PO TBEC
40.0000 mg | DELAYED_RELEASE_TABLET | Freq: Every day | ORAL | Status: DC
  Administered 2020-01-02 – 2020-01-03 (×2): 40 mg via ORAL
  Filled 2020-01-02 (×3): qty 1

## 2020-01-02 MED ORDER — VERAPAMIL HCL 2.5 MG/ML IV SOLN
INTRAVENOUS | Status: DC | PRN
  Administered 2020-01-02: 2.5 mg via INTRA_ARTERIAL

## 2020-01-02 MED ORDER — TICAGRELOR 90 MG PO TABS
90.0000 mg | ORAL_TABLET | Freq: Two times a day (BID) | ORAL | Status: DC
  Administered 2020-01-02 – 2020-01-03 (×2): 90 mg via ORAL
  Filled 2020-01-02 (×2): qty 1

## 2020-01-02 MED ORDER — FENTANYL CITRATE (PF) 100 MCG/2ML IJ SOLN
INTRAMUSCULAR | Status: AC
  Filled 2020-01-02: qty 2

## 2020-01-02 MED ORDER — FLUTICASONE PROPIONATE 50 MCG/ACT NA SUSP
1.0000 | Freq: Every day | NASAL | Status: DC
  Filled 2020-01-02: qty 16

## 2020-01-02 MED ORDER — ACETAMINOPHEN 325 MG PO TABS: 650.0000 mg | ORAL_TABLET | ORAL | Status: DC | PRN

## 2020-01-02 MED ORDER — TICAGRELOR 90 MG PO TABS
ORAL_TABLET | ORAL | Status: DC | PRN
  Administered 2020-01-02: 180 mg via ORAL

## 2020-01-02 MED ORDER — ASPIRIN 81 MG PO CHEW
CHEWABLE_TABLET | ORAL | Status: AC
  Filled 2020-01-02: qty 1

## 2020-01-02 MED ORDER — MIDAZOLAM HCL 2 MG/ML PO SYRP
6.0000 mg | ORAL_SOLUTION | Freq: Once | ORAL | Status: AC
  Administered 2020-01-02: 6 mg via ORAL

## 2020-01-02 MED ORDER — HYDRALAZINE HCL 20 MG/ML IJ SOLN: 10.0000 mg | INTRAMUSCULAR | Status: AC | PRN

## 2020-01-02 MED ORDER — UMECLIDINIUM-VILANTEROL 62.5-25 MCG/INH IN AEPB
1.0000 | INHALATION_SPRAY | Freq: Every day | RESPIRATORY_TRACT | Status: DC
  Administered 2020-01-03: 1 via RESPIRATORY_TRACT
  Filled 2020-01-02: qty 14

## 2020-01-02 MED ORDER — HEPARIN SODIUM (PORCINE) 1000 UNIT/ML IJ SOLN
INTRAMUSCULAR | Status: DC | PRN
  Administered 2020-01-02: 4000 [IU] via INTRAVENOUS
  Administered 2020-01-02 (×2): 3000 [IU] via INTRAVENOUS
  Administered 2020-01-02: 6000 [IU] via INTRAVENOUS

## 2020-01-02 SURGICAL SUPPLY — 15 items
BALLN MINITREK RX 1.5X12 (BALLOONS) ×3
BALLOON MINITREK RX 1.5X12 (BALLOONS) ×2 IMPLANT
CATH 5F 110X4 TIG (CATHETERS) ×3 IMPLANT
CATH LAUNCHER 6FR JL3.5 (CATHETERS) ×3 IMPLANT
CATH VISTA GUIDE 6FR XB3.5 (CATHETERS) ×3 IMPLANT
DEVICE RAD TR BAND REGULAR (VASCULAR PRODUCTS) ×3 IMPLANT
GLIDESHEATH SLEND SS 6F .021 (SHEATH) ×3 IMPLANT
GUIDEWIRE INQWIRE 1.5J.035X260 (WIRE) ×2 IMPLANT
INQWIRE 1.5J .035X260CM (WIRE) ×3
KIT ENCORE 26 ADVANTAGE (KITS) ×3 IMPLANT
KIT MANI 3VAL PERCEP (MISCELLANEOUS) ×3 IMPLANT
PACK CARDIAC CATH (CUSTOM PROCEDURE TRAY) ×3 IMPLANT
STENT SYNERGY DES 2.25X16 (Permanent Stent) ×3 IMPLANT
WIRE ASAHI PROWATER 180CM (WIRE) ×3 IMPLANT
WIRE G HI TQ BMW 190 (WIRE) ×3 IMPLANT

## 2020-01-02 NOTE — Progress Notes (Signed)
Dr. Saralyn Pilar spoke with patient and family. To be admitted overnight, discharge in am. Patient concerned she was to have orthro surgery this Friday. Per Dr. Saralyn Pilar pt now on blood thinner and will have to wait three months or more. Patient verbalizes

## 2020-01-02 NOTE — Progress Notes (Signed)
[facility] Perioperative Services  Pre-Admission/Anesthesia Testing Clinical Review  Date: 01/02/20  Patient Demographics:  Name: Brandi Acevedo DOB:   April 05, 1965 MRN:   [id number]  Planned Surgical Procedure(s):    Case: [id number] Date/Time: 01/05/20 0715   Procedures:      GASTROC RECESSION EXTREMITY LEFT (Left )     ACHILLES TENDON REPAIR SECONDARY (Left )     HAGLUNDS/RETROCALCANEAL (Left )   Anesthesia type: Choice   Pre-op diagnosis: LEft Achilles Tendonitis, Gastrocnemeis Quinus   Location: [facility] OR ROOM 02 / [place] ORS FOR ANESTHESIA GROUP   Surgeons: Caroline More, DPM     NOTE: Available PAT nursing documentation and vital signs have been reviewed. Clinical nursing staff has updated patient's PMH/PSHx, current medication list, and drug allergies/intolerances to ensure comprehensive history available to assist in medical decision making as it pertains to the aforementioned surgical procedure and anticipated anesthetic course.   Clinical Discussion:  Brandi Acevedo is a 54 y.o. female who is submitted for pre-surgical anesthesia review and clearance prior to her undergoing the above procedure. Patient is a Current Smoker (15 pack years). Pertinent PMH includes: CAD, angina, MI (2007), HTN, T2DM, OSAH (no nocturnal PAP therapy), COPD, GERD (on daily PPI therapy), seizures, RLS, suicidal ideation, anxiety, depression.  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  Patient is followed by cardiology Saralyn Pilar, MD). She was last seen in the cardiology clinic on 12/11/2019; notes reviewed.  Patient being seen in follow-up after presenting [facility] ED on 10/13/2019 with an episode of atypical retrosternal angina.  Work-up, including EKG and serial troponins x2, was negative at that time.  Patient complained of chronic exertional dyspnea related to her COPD.  Of note, patient suffered a lateral wall STEMI back in 2007; underwent PCI with BMS  placement x1 to the LAD. Hypertension well controlled with lifestyle modification alone.  HLD being managed with ezetimibe and gemfibrozil.  Last TTE performed on 12/04/2019 revealed a normal LV systolic function; LVEF >26%.  Subsequent myocardial perfusion imaging revealed an LVEF of 69% with concerns for mild inferoapical ischemia (see full interpretation of cardiovascular testing below).  Repeat cardiac catheterization was recommended by cardiology.  Patient underwent repeat left heart catheterization on 01/02/2020 that revealed progressive CAD resulting in placement of DES x1 to the mid left circumflex; LVEF 55% (see full catheterization results below).  Post-cath recommendations were for uninterrupted DAPT therapy (ASA + ticagrelor) x1 year and aggressive factor modification.  Patient to be admitted overnight for medical monitoring.  Of note, patient scheduled to undergo elective orthopedic/podiatric procedure later this week.  Per cardiology, given the fact the patient has been started on DAPT therapy, procedure will need to be postponed for at least 3 months.  She denies previous perioperative complications with anesthesia. She underwent a general anesthetic course at [facility] (ASA III) in 03/2019 with no documented complications.   Vitals with BMI 01/02/2020 01/02/2020 01/02/2020  Height - - -  Weight - - -  BMI - - -  Systolic 712 458 099  Diastolic 44 63 63  Pulse 64 - 66    Providers/Specialists:   NOTE: Primary physician provider listed below. Patient may have been seen by APP or partner within same practice.   PROVIDER ROLE LAST Norva Riffle, DPM Podiatry (Surgeon) 12/29/2019  Valerie Roys, DO Primary Care Provider 11/13/2019  Isaias Cowman, MD Cardiology 01/02/2020   Allergies:  Amoxicillin-pot clavulanate, Ativan [lorazepam], Atorvastatin,  Codeine, Wellbutrin [bupropion], Flagyl [metronidazole], Keflex [cephalexin], and Septra  [sulfamethoxazole-trimethoprim]  Current Home Medications:   No current facility-administered medications for this encounter.   No current outpatient medications on file.   Marland Kitchen 0.9 %  sodium chloride infusion  . 0.9 %  sodium chloride infusion  . 0.9% sodium chloride infusion  . 0.9% sodium chloride infusion  . acetaminophen (TYLENOL) tablet 650 mg  . albuterol (PROVENTIL) (2.5 MG/3ML) 0.083% nebulizer solution 2.5 mg  . aspirin 81 MG chewable tablet  . aspirin chewable tablet 81 mg  . citalopram (CELEXA) tablet 40 mg  . dicyclomine (BENTYL) tablet 20 mg  . ezetimibe (ZETIA) tablet 10 mg  . fenofibrate tablet 160 mg  . fluticasone (FLONASE) 50 MCG/ACT nasal spray 1 spray  . gabapentin (NEURONTIN) capsule 100 mg  . gemfibrozil (LOPID) tablet 600 mg  . hydrALAZINE (APRESOLINE) injection 10 mg  . hydrOXYzine (ATARAX/VISTARIL) tablet 25 mg  . labetalol (NORMODYNE) injection 10 mg  . ondansetron (ZOFRAN) injection 4 mg  . pantoprazole (PROTONIX) EC tablet 40 mg  . rOPINIRole (REQUIP) tablet 1 mg  . sodium chloride flush (NS) 0.9 % injection 3 mL  . sodium chloride flush (NS) 0.9 % injection 3 mL  . sodium chloride flush (NS) 0.9 % injection 3 mL  . sodium chloride flush (NS) 0.9 % injection 3 mL  . ticagrelor (BRILINTA) tablet 90 mg  . umeclidinium-vilanterol (ANORO ELLIPTA) 62.5-25 MCG/INH 1 puff   History:   Past Medical History:  Diagnosis Date  . Anginal pain ([place])   . Anxiety   . COPD (chronic obstructive pulmonary disease) ([place])   . Deaf, left   . Depression   . Diabetes mellitus without complication ([place])   . GERD (gastroesophageal reflux disease)   . Hearing loss   . Heart attack ([place])    2007  . Hypertension   . IBS (irritable bowel syndrome)   . Pre-diabetes   . RLS (restless legs syndrome)   . RLS (restless legs syndrome)   . Seizure ([place])    x1 - attributed to use of atorvastatin  . Self-inflicted injury 4/0/3474  . Sleep apnea    no CPAP  . Suicidal  ideation 05/16/2015  . Trigger finger of left thumb   . Wears dentures    full upper  . Wears hearing aid in right ear    has, does not wear   Past Surgical History:  Procedure Laterality Date  . ABDOMINAL HYSTERECTOMY    . ACHILLES TENDON REPAIR Right    [facility] - Dr Elvina Mattes  . ACHILLES TENDON SURGERY Left 03/23/2019   Procedure: ACHILLES REPAIR-SECONDARY LEFT;  Surgeon: Albertine Patricia, DPM;  Location: [place];  Service: Podiatry;  Laterality: Left;  LMA WITH POP BLOCK  . CALCANEAL OSTEOTOMY Left 03/23/2019   Procedure: PARTIAL CALCANECTOMY LEFT;  Surgeon: Albertine Patricia, DPM;  Location: [place];  Service: Podiatry;  Laterality: Left;  sleep apnea  . CARDIAC CATHETERIZATION  2007   LAD stent  . CARPAL TUNNEL RELEASE Right 04/23/2016   Procedure: CARPAL TUNNEL RELEASE;  Surgeon: Hessie Knows, MD;  Location: [facility] ORS;  Service: Orthopedics;  Laterality: Right;  . CESAREAN SECTION     X 3  . CHOLECYSTECTOMY    . DILATION AND CURETTAGE OF UTERUS    . HERNIA REPAIR    . TONSILLECTOMY    . TYMPANOSTOMY TUBE PLACEMENT     X 2   Family History  Problem Relation Age of Onset  . Diabetes  Mother   . Restless legs syndrome Mother   . Sleep apnea Mother   . Hypertension Mother   . Hyperlipidemia Mother   . Diabetes Father   . Hypertension Father   . Hyperlipidemia Father   . Heart disease Father   . Heart attack Father   . Diabetes Sister   . Hyperlipidemia Sister   . Hypertension Sister   . Heart disease Sister   . Heart attack Sister   . Hyperlipidemia Daughter   . Mental illness Daughter        Bipolar Disorder  . Heart disease Maternal Grandmother   . Stroke Maternal Grandfather   . Heart attack Maternal Grandfather   . Cancer Paternal Grandmother   . Hyperlipidemia Daughter    Social History   Tobacco Use  . Smoking status: Current Every Day Smoker    Packs/day: 0.50    Years: 30.00    Pack years: 15.00    Types: Cigarettes  . Smokeless  tobacco: Never Used  Vaping Use  . Vaping Use: Never used  Substance Use Topics  . Alcohol use: No  . Drug use: No    Pertinent Clinical Results:  LABS: Labs reviewed: Acceptable for surgery.      ECG: Date: 01/02/2020 Time ECG obtained: 1021 AM Rate: 61 bpm Rhythm: normal sinus Axis (leads I and aVF): Normal Intervals: QRS 79 ms. QTc 435 ms. ST segment and T wave changes: No evidence of acute ST segment elevation or depression Comparison: Similar to previous tracing obtained on 10/12/2019  IMAGING / PROCEDURES: LEFT HEART CATHETERIZATION done on 01/02/2020 1. Non-stenotic Mid LAD-2 lesion was previously treated. 2. Mid LAD-1 lesion is 30% stenosed. 3. 1st Mrg lesion is 99% stenosed. 4. Prox Cx to Mid Cx lesion is 85% stenosed. 5. Mid Cx to Dist Cx lesion is 30% stenosed. 6. Prox RCA lesion is 20% stenosed. 7. Normal left ventricular function with estimated LV ejection fraction of 55% 8. Successful PCI with DES proximal/mid left circumflex  LEXISCAN done on 12/04/2019 1. LVEF 69% 2. Mild inferoapical ischemia 3. Regional wall motion reveals normal myocardial thickening and wall motion 4. The overall quality of study is good 5. No artifacts noted 6. Normal left ventricular cavity  ECHOCARDIOGRAM done on 12/04/2019 1. LVEF >55% 2. Normal LV systolic function 3. Normal RV systolic function 4. No AR or PR, trivial MR, mild TR 5. No valvular stenosis  Impression and Plan:  Brandi Acevedo has been referred for pre-anesthesia review and clearance prior to her undergoing the planned anesthetic and procedural courses.  As part of her recent cardiovascular work-up, patient was found to have positive findings on her myocardial perfusion imaging suggesting inferoapical ischemia.  The decision was made by her cardiologist Saralyn Pilar, MD) to pursue left heart catheterization prior to issuing presurgical cardiac clearance for her elective orthopedic/podiatric procedure.   Cardiac catheterization done today revealed stenosis of the proximal/mid left circumflex artery.  DES x1 was placed and patient started on DAPT therapy.  Per cardiology, elective surgical procedure will need to be postponed for at least 3 months at this point.  Will make podiatry and OR aware so that procedure can be appropriately rescheduled.  Honor Loh, MSN, APRN, FNP-C, CEN [facility]  Peri-operative Services Nurse Practitioner Phone: [phone] 01/02/20 11:20 AM  NOTE: This note has been prepared using Dragon dictation software. Despite my best ability to proofread, there is always the potential that unintentional transcriptional errors may still occur from this  process.

## 2020-01-02 NOTE — Progress Notes (Signed)
Report given to [facility], pt to be admitted to RM 240.  Patient up ambulated to bathroom without event.  Remains in SR No c/o's. Right radial c/d/i no hematoma, no bleeding noted.

## 2020-01-02 NOTE — Progress Notes (Signed)
Per Dr. Saralyn Pilar 2mg  po versed given for anxiety prior to cath. Patient tolerated without event, will monitor effects.

## 2020-01-02 NOTE — Progress Notes (Signed)
Patient received total 8mg  po versed. Awake, remains very restless, states "my legs are killing me"  Will continue to monitor closely.

## 2020-01-03 ENCOUNTER — Other Ambulatory Visit: Payer: BLUE CROSS/BLUE SHIELD

## 2020-01-03 DIAGNOSIS — I2510 Atherosclerotic heart disease of native coronary artery without angina pectoris: Secondary | ICD-10-CM | POA: Diagnosis not present

## 2020-01-03 LAB — CBC
HCT: 37.3 % (ref 36.0–46.0)
Hemoglobin: 12.2 g/dL (ref 12.0–15.0)
MCH: 29.8 pg (ref 26.0–34.0)
MCHC: 32.7 g/dL (ref 30.0–36.0)
MCV: 91.2 fL (ref 80.0–100.0)
Platelets: 223 10*3/uL (ref 150–400)
RBC: 4.09 MIL/uL (ref 3.87–5.11)
RDW: 13.2 % (ref 11.5–15.5)
WBC: 7.7 10*3/uL (ref 4.0–10.5)
nRBC: 0 % (ref 0.0–0.2)

## 2020-01-03 LAB — BASIC METABOLIC PANEL
Anion gap: 8 (ref 5–15)
BUN: 10 mg/dL (ref 6–20)
CO2: 26 mmol/L (ref 22–32)
Calcium: 8.6 mg/dL — ABNORMAL LOW (ref 8.9–10.3)
Chloride: 105 mmol/L (ref 98–111)
Creatinine, Ser: 0.71 mg/dL (ref 0.44–1.00)
GFR, Estimated: >60 mL/min (ref >60)
Glucose, Bld: 178 mg/dL — ABNORMAL HIGH (ref 70–99)
Potassium: 3.8 mmol/L (ref 3.5–5.1)
Sodium: 139 mmol/L (ref 135–145)

## 2020-01-03 MED ORDER — TICAGRELOR 90 MG PO TABS: 90.0000 mg | ORAL_TABLET | Freq: Two times a day (BID) | ORAL | 6 refills | Status: AC

## 2020-01-03 MED ORDER — ASPIRIN 81 MG PO CHEW: 81.0000 mg | CHEWABLE_TABLET | Freq: Every day | ORAL | 6 refills | Status: AC

## 2020-01-03 NOTE — Plan of Care (Signed)

## 2020-01-03 NOTE — Discharge Summary (Signed)
Physician Discharge Summary  Patient ID: Brandi Acevedo MRN: [id number] DOB/AGE: [birth year] 54 y.o.  Admit date: 01/02/2020 Discharge date: 01/03/2020  Primary Discharge Diagnosis chest pain Secondary Discharge Diagnosis coronary artery disease  Significant Diagnostic Studies: yes  Consults: None  Hospital Course: The patient underwent elective cardiac catheterization on 01/02/2020.  Brandi Acevedo arteriography revealed high-grade 85% stenosis in the proximal/mid segment of the left circumflex coronary artery.  The patient underwent PCI receiving drug-eluting stent in the proximal/mid segment of left circumflex.  The patient had an uncomplicated hospital stay without recurrent chest pain.  On the morning of 10//2021, the patient was discharged home in stable condition.   Discharge Exam: Blood pressure 114/70, pulse 62, temperature 98 F (36.7 C), temperature source Oral, resp. rate 19, height 5\' 1"  (1.549 m), weight 80.5 kg, SpO2 99 %.   General appearance: alert Head: Normocephalic, without obvious abnormality, atraumatic Eyes: conjunctivae/corneas clear. PERRL, EOM's intact. Fundi benign. Ears: normal TM's and external ear canals both ears Nose: Nares normal. Septum midline. Mucosa normal. No drainage or sinus tenderness. Throat: lips, mucosa, and tongue normal; teeth and gums normal Neck: no adenopathy, no carotid bruit, no JVD, supple, symmetrical, trachea midline and thyroid not enlarged, symmetric, no tenderness/mass/nodules Back: symmetric, no curvature. ROM normal. No CVA tenderness. Resp: clear to auscultation bilaterally Chest wall: no tenderness Cardio: regular rate and rhythm, S1, S2 normal, no murmur, click, rub or gallop GI: soft, non-tender; bowel sounds normal; no masses,  no organomegaly Extremities: extremities normal, atraumatic, no cyanosis or edema Pulses: 2+ and symmetric Skin: Skin color, texture, turgor normal. No rashes or lesions Lymph nodes: Cervical,  supraclavicular, and axillary nodes normal. Neurologic: Grossly normal Incision/Wound: Well-healing Labs:   Lab Results  Component Value Date   WBC 7.7 01/03/2020   HGB 12.2 01/03/2020   HCT 37.3 01/03/2020   MCV 91.2 01/03/2020   PLT 223 01/03/2020    Recent Labs  Lab 01/03/20 0419  NA 139  K 3.8  CL 105  CO2 26  BUN 10  CREATININE 0.71  CALCIUM 8.6*  GLUCOSE 178*      Radiology:  EKG: Sinus rhythm  FOLLOW UP PLANS AND APPOINTMENTS Discharge Instructions    AMB Referral to Cardiac Rehabilitation - Phase II   Complete by: As directed    Diagnosis: Coronary Stents   After initial evaluation and assessments completed: Virtual Based Care may be provided alone or in conjunction with Phase 2 Cardiac Rehab based on patient barriers.: Yes     Allergies as of 01/03/2020      Reactions   Amoxicillin-pot Clavulanate Itching   Ativan [lorazepam] Other (See Comments)   Altered mental status   Atorvastatin Other (See Comments)   Causes seizures   Codeine    Gi upset   Wellbutrin [bupropion] Other (See Comments)   Depression   Flagyl [metronidazole] Rash   Keflex [cephalexin] Rash   Septra [sulfamethoxazole-trimethoprim] Rash      Medication List    STOP taking these medications   nicotine 14 mg/24hr patch Commonly known as: Nicoderm CQ     TAKE these medications   aspirin 81 MG chewable tablet Chew 1 tablet (81 mg total) by mouth daily.   citalopram 40 MG tablet Commonly known as: CELEXA Take 1 tablet (40 mg total) by mouth daily. What changed: when to take this   ezetimibe 10 MG tablet Commonly known as: ZETIA Take 1 tablet (10 mg total) by mouth daily. What changed: when to take this  fenofibrate 160 MG tablet Take 1 tablet (160 mg total) by mouth daily. What changed: when to take this   gemfibrozil 600 MG tablet Commonly known as: LOPID TAKE 1 TABLET BY MOUTH TWICE DAILY BEFORE A MEAL What changed:   how much to take  how to take  this  when to take this  additional instructions   hydrOXYzine 25 MG tablet Commonly known as: ATARAX/VISTARIL Take 1 tablet (25 mg total) by mouth 3 (three) times daily as needed. What changed: when to take this   pantoprazole 40 MG tablet Commonly known as: PROTONIX Take 1 tablet (40 mg total) by mouth daily. What changed: when to take this   rOPINIRole 1 MG tablet Commonly known as: REQUIP Take 1 mg by mouth at bedtime. What changed: Another medication with the same name was removed. Continue taking this medication, and follow the directions you see here.   rOPINIRole 2 MG tablet Commonly known as: REQUIP Take 2 mg by mouth at bedtime. What changed: Another medication with the same name was removed. Continue taking this medication, and follow the directions you see here.   ticagrelor 90 MG Tabs tablet Commonly known as: BRILINTA Take 1 tablet (90 mg total) by mouth 2 (two) times daily.   Voltaren 1 % Gel Generic drug: diclofenac Sodium Apply 2 g topically daily as needed (pain).       Follow-up Information    Hughie Melroy, MD Follow up in 1 week(s).   Specialty: Cardiology Contact information: [facility] [place] [address] [phone]               BRING ALL MEDICATIONS WITH YOU TO FOLLOW UP APPOINTMENTS  Time spent with patient to include physician time: 25 minutes Signed:  Isaias Cowman MD, PhD, [facility] 01/03/2020, 7:51 AM

## 2020-01-05 ENCOUNTER — Encounter: Admission: RE | Payer: Self-pay | Source: Home / Self Care

## 2020-01-05 ENCOUNTER — Ambulatory Visit: Admission: RE | Admit: 2020-01-05 | Payer: BLUE CROSS/BLUE SHIELD | Source: Home / Self Care | Admitting: Podiatry

## 2020-01-05 HISTORY — DX: Essential (primary) hypertension: I10

## 2020-01-05 SURGERY — RECESSION, TENDON, GASTROCNEMIUS
Anesthesia: Choice | Laterality: Left

## 2020-02-13 ENCOUNTER — Encounter: Payer: Self-pay | Admitting: Family Medicine

## 2020-02-27 ENCOUNTER — Other Ambulatory Visit: Payer: Self-pay | Admitting: Infectious Diseases

## 2020-02-27 DIAGNOSIS — Z1231 Encounter for screening mammogram for malignant neoplasm of breast: Secondary | ICD-10-CM

## 2020-03-04 ENCOUNTER — Telehealth: Payer: Self-pay | Admitting: *Deleted

## 2020-03-04 NOTE — Telephone Encounter (Signed)
Received referral for low dose lung cancer screening CT scan. Message left at phone number listed in EMR for patient to call me back to facilitate scheduling scan.  

## 2020-04-04 ENCOUNTER — Other Ambulatory Visit: Payer: Self-pay | Admitting: Infectious Diseases

## 2020-04-04 ENCOUNTER — Other Ambulatory Visit: Payer: Self-pay | Admitting: Family Medicine

## 2020-04-04 DIAGNOSIS — R7989 Other specified abnormal findings of blood chemistry: Secondary | ICD-10-CM

## 2020-04-04 DIAGNOSIS — E1159 Type 2 diabetes mellitus with other circulatory complications: Secondary | ICD-10-CM

## 2020-04-26 ENCOUNTER — Telehealth: Payer: Self-pay | Admitting: *Deleted

## 2020-04-26 NOTE — Telephone Encounter (Signed)
Received referral for low dose lung cancer screening CT scan. Message left at phone number listed in EMR for patient to call me back to facilitate scheduling scan.  

## 2020-05-17 ENCOUNTER — Encounter: Payer: Medicaid Other | Attending: Cardiology | Admitting: *Deleted

## 2020-05-17 ENCOUNTER — Other Ambulatory Visit: Payer: Self-pay

## 2020-05-17 DIAGNOSIS — Z955 Presence of coronary angioplasty implant and graft: Secondary | ICD-10-CM | POA: Insufficient documentation

## 2020-05-17 NOTE — Progress Notes (Signed)
Initial telephone orientation completed. Diagnosis can be found in [facility] 1/26. EP orientation scheduled for Thursday 3/17 at 3pm

## 2020-05-30 ENCOUNTER — Encounter: Payer: Self-pay | Admitting: *Deleted

## 2020-05-30 ENCOUNTER — Other Ambulatory Visit: Payer: Self-pay

## 2020-05-30 VITALS — Ht 60.5 in | Wt 177.6 lb

## 2020-05-30 DIAGNOSIS — Z955 Presence of coronary angioplasty implant and graft: Secondary | ICD-10-CM | POA: Diagnosis present

## 2020-05-30 NOTE — Progress Notes (Signed)
Cardiac Individual Treatment Plan  Patient Details  Name: Brandi Acevedo MRN: [id number] Date of Birth: 1965/07/25 Referring Provider:   Flowsheet Row Cardiac Rehab from 05/30/2020 in [facility] Cardiac and Pulmonary Rehab  Referring Provider Isaias Cowman MD      Initial Encounter Date:  Flowsheet Row Cardiac Rehab from 05/30/2020 in [facility] Cardiac and Pulmonary Rehab  Date 05/30/20      Visit Diagnosis: Status post coronary artery stent placement  Patient's Home Medications on Admission:  Current Outpatient Medications:  .  aspirin 81 MG chewable tablet, Chew 1 tablet (81 mg total) by mouth daily., Disp: 30 tablet, Rfl: 6 .  citalopram (CELEXA) 40 MG tablet, Take 1 tablet by mouth once daily, Disp: 90 tablet, Rfl: 0 .  diclofenac Sodium (VOLTAREN) 1 % GEL, Apply 2 g topically daily as needed (pain). (Patient not taking: Reported on 05/17/2020), Disp: , Rfl:  .  ezetimibe (ZETIA) 10 MG tablet, Take 1 tablet (10 mg total) by mouth daily. (Patient taking differently: Take 10 mg by mouth at bedtime.), Disp: 90 tablet, Rfl: 0 .  fenofibrate 160 MG tablet, Take 1 tablet (160 mg total) by mouth daily. (Patient taking differently: Take 160 mg by mouth at bedtime.), Disp: 90 tablet, Rfl: 0 .  gabapentin (NEURONTIN) 300 MG capsule, Take 300 mg by mouth daily., Disp: , Rfl:  .  gemfibrozil (LOPID) 600 MG tablet, TAKE 1 TABLET BY MOUTH TWICE DAILY BEFORE A MEAL (Patient taking differently: Take 600 mg by mouth 2 (two) times daily before a meal. TAKE 1 TABLET BY MOUTH TWICE DAILY BEFORE A MEA), Disp: 180 tablet, Rfl: 0 .  hydrOXYzine (ATARAX/VISTARIL) 25 MG tablet, Take 1 tablet (25 mg total) by mouth 3 (three) times daily as needed. (Patient taking differently: Take 25 mg by mouth at bedtime.), Disp: 270 tablet, Rfl: 0 .  pantoprazole (PROTONIX) 40 MG tablet, Take 1 tablet (40 mg total) by mouth daily. (Patient taking differently: Take 40 mg by mouth at bedtime.), Disp: 90 tablet, Rfl: 0 .   rOPINIRole (REQUIP) 1 MG tablet, Take 1 mg by mouth at bedtime., Disp: , Rfl:  .  rOPINIRole (REQUIP) 2 MG tablet, Take 2 mg by mouth at bedtime., Disp: , Rfl:  .  ticagrelor (BRILINTA) 90 MG TABS tablet, Take 1 tablet (90 mg total) by mouth 2 (two) times daily., Disp: 60 tablet, Rfl: 6  Past Medical History: Past Medical History:  Diagnosis Date  . Anginal pain ([place])   . Anxiety   . COPD (chronic obstructive pulmonary disease) ([place])   . Deaf, left   . Depression   . Diabetes mellitus without complication ([place])   . GERD (gastroesophageal reflux disease)   . Hearing loss   . Heart attack ([place])    2007  . Hypertension   . IBS (irritable bowel syndrome)   . Pre-diabetes   . RLS (restless legs syndrome)   . RLS (restless legs syndrome)   . Seizure ([place])    x1 - attributed to use of atorvastatin  . Self-inflicted injury 09/17/1698  . Sleep apnea    no CPAP  . Suicidal ideation 05/16/2015  . Trigger finger of left thumb   . Wears dentures    full upper  . Wears hearing aid in right ear    has, does not wear    Tobacco Use: Social History   Tobacco Use  Smoking Status Current Every Day Smoker  . Packs/day: 0.50  . Years: 30.00  . Pack years: 15.00  .  Types: Cigarettes  Smokeless Tobacco Never Used    Brandi Acevedo is a current tobacco user. Intervention for tobacco cessation was provided at the initial medical review. She was asked about readiness to quit and reported she is ready to quit but has no start date yet . Patient reports she has nicotine patches and stopped using them, but plans to try to use them again. Patient was advised and educated about tobacco cessation using combination therapy, tobacco cessation classes, quit line, and quit smoking apps. Patient demonstrated understanding of this material. Staff will continue to provide encouragement and follow up with the patient throughout the program.     Labs: Recent Review Flowsheet Data    Labs for ITP Cardiac and Pulmonary  Rehab Latest Ref Rng & Units 12/17/2015 08/28/2016 11/18/2016 10/07/2018 11/13/2019   Cholestrol 100 - 199 mg/dL 256(H) 251(H) 215(H) 211(H) 178   LDLCALC 0 - 99 mg/dL 156(H) 163(H) 118(H) 110(H) 119(H)   HDL >39 mg/dL 32(L) 35(L) 34(L) 32(L) 32(L)   Trlycerides 0 - 149 mg/dL 338(H) 267(H) 315(H) 345(H) 153(H)   Hemoglobin A1c <7.0 % 6.1(H) 5.5 - 6.3 6.6       Exercise Target Goals: Exercise Program Goal: Individual exercise prescription set using results from initial 6 min walk test and THRR while considering  patient's activity barriers and safety.   Exercise Prescription Goal: Initial exercise prescription builds to 30-45 minutes a day of aerobic activity, 2-3 days per week.  Home exercise guidelines will be given to patient during program as part of exercise prescription that the participant will acknowledge.   Education: Aerobic Exercise: - Group verbal and visual presentation on the components of exercise prescription. Introduces F.I.T.T principle from ACSM for exercise prescriptions.  Reviews F.I.T.T. principles of aerobic exercise including progression. Written material given at graduation.   Education: Resistance Exercise: - Group verbal and visual presentation on the components of exercise prescription. Introduces F.I.T.T principle from ACSM for exercise prescriptions  Reviews F.I.T.T. principles of resistance exercise including progression. Written material given at graduation.    Education: Exercise & Equipment Safety: - Individual verbal instruction and demonstration of equipment use and safety with use of the equipment. Flowsheet Row Cardiac Rehab from 05/30/2020 in [facility] Cardiac and Pulmonary Rehab  Education need identified 05/30/20  Date 05/30/20  Educator [place]  Instruction Review Code 1- Verbalizes Understanding      Education: Exercise Physiology & General Exercise Guidelines: - Group verbal and written instruction with models to review the exercise physiology of the  cardiovascular system and associated critical values. Provides general exercise guidelines with specific guidelines to those with heart or lung disease.    Education: Flexibility, Balance, Mind/Body Relaxation: - Group verbal and visual presentation with interactive activity on the components of exercise prescription. Introduces F.I.T.T principle from ACSM for exercise prescriptions. Reviews F.I.T.T. principles of flexibility and balance exercise training including progression. Also discusses the mind body connection.  Reviews various relaxation techniques to help reduce and manage stress (i.e. Deep breathing, progressive muscle relaxation, and visualization). Balance handout provided to take home. Written material given at graduation.   Activity Barriers & Risk Stratification:  Activity Barriers & Cardiac Risk Stratification - 05/30/20 1700      Activity Barriers & Cardiac Risk Stratification   Activity Barriers Joint Problems;Balance Concerns;Shortness of Breath;Muscular Weakness;History of Falls;Other (comment)    Comments Left foot pain due to lack of healing from achilles repair    Cardiac Risk Stratification High  6 Minute Walk:  6 Minute Walk    Row Name 05/30/20 1701         6 Minute Walk   Phase Initial     Distance 600 feet     Walk Time 3.8 minutes  2 breaks; stopped at 4:45 min     # of Rest Breaks 3     MPH 1.79     METS 1.2     RPE 13     Perceived Dyspnea  3     VO2 Peak 4.21     Symptoms Yes (comment)     Comments SOB (stopped test), left foot pain 4/10     Resting HR 69 bpm     Resting BP 114/70     Resting Oxygen Saturation  98 %     Exercise Oxygen Saturation  during 6 min walk 97 %     Max Ex. HR 81 bpm     Max Ex. BP 120/68     2 Minute Post BP 116/68            Oxygen Initial Assessment:   Oxygen Re-Evaluation:   Oxygen Discharge (Final Oxygen Re-Evaluation):   Initial Exercise Prescription:  Initial Exercise Prescription -  05/30/20 1700      Date of Initial Exercise RX and Referring Provider   Date 05/30/20    Referring Provider Paraschos, [place] MD      Recumbant Bike   Level 1    RPM 60    Watts 10    Minutes 15    METs 1.2      NuStep   Level 1    SPM 80    Minutes 15    METs 1.2      Biostep-RELP   Level 1    SPM 50    Minutes 15    METs 1.2      Prescription Details   Frequency (times per week) 3    Duration Progress to 30 minutes of continuous aerobic without signs/symptoms of physical distress      Intensity   THRR 40-80% of Max Heartrate 107-146    Ratings of Perceived Exertion 11-13    Perceived Dyspnea 0-4      Progression   Progression Continue to progress workloads to maintain intensity without signs/symptoms of physical distress.      Resistance Training   Training Prescription Yes    Weight 3 lb    Reps 10-15           Perform Capillary Blood Glucose checks as needed.  Exercise Prescription Changes:  Exercise Prescription Changes    Row Name 05/30/20 1700             Response to Exercise   Blood Pressure (Admit) 114/70       Blood Pressure (Exercise) 120/68       Blood Pressure (Exit) 116/68       Heart Rate (Admit) 69 bpm       Heart Rate (Exercise) 81 bpm       Heart Rate (Exit) 67 bpm       Oxygen Saturation (Admit) 98 %       Oxygen Saturation (Exercise) 97 %       Oxygen Saturation (Exit) 97 %       Rating of Perceived Exertion (Exercise) 13       Perceived Dyspnea (Exercise) 3       Symptoms SOB, L. foot pain 4/10  Comments walk test results              Exercise Comments:   Exercise Goals and Review:  Exercise Goals    Row Name 05/30/20 1708             Exercise Goals   Increase Physical Activity Yes       Intervention Provide advice, education, support and counseling about physical activity/exercise needs.;Develop an individualized exercise prescription for aerobic and resistive training based on initial evaluation  findings, risk stratification, comorbidities and participant's personal goals.       Expected Outcomes Short Term: Attend rehab on a regular basis to increase amount of physical activity.;Long Term: Add in home exercise to make exercise part of routine and to increase amount of physical activity.;Long Term: Exercising regularly at least 3-5 days a week.       Increase Strength and Stamina Yes       Intervention Provide advice, education, support and counseling about physical activity/exercise needs.;Develop an individualized exercise prescription for aerobic and resistive training based on initial evaluation findings, risk stratification, comorbidities and participant's personal goals.       Expected Outcomes Short Term: Increase workloads from initial exercise prescription for resistance, speed, and METs.;Short Term: Perform resistance training exercises routinely during rehab and add in resistance training at home;Long Term: Improve cardiorespiratory fitness, muscular endurance and strength as measured by increased METs and functional capacity (6MWT)       Able to understand and use rate of perceived exertion (RPE) scale Yes       Intervention Provide education and explanation on how to use RPE scale       Expected Outcomes Short Term: Able to use RPE daily in rehab to express subjective intensity level;Long Term:  Able to use RPE to guide intensity level when exercising independently       Able to understand and use Dyspnea scale Yes       Intervention Provide education and explanation on how to use Dyspnea scale       Expected Outcomes Short Term: Able to use Dyspnea scale daily in rehab to express subjective sense of shortness of breath during exertion;Long Term: Able to use Dyspnea scale to guide intensity level when exercising independently       Knowledge and understanding of Target Heart Rate Range (THRR) Yes       Intervention Provide education and explanation of THRR including how the numbers  were predicted and where they are located for reference       Expected Outcomes Short Term: Able to state/look up THRR;Short Term: Able to use daily as guideline for intensity in rehab;Long Term: Able to use THRR to govern intensity when exercising independently       Able to check pulse independently Yes       Intervention Provide education and demonstration on how to check pulse in carotid and radial arteries.;Review the importance of being able to check your own pulse for safety during independent exercise       Expected Outcomes Short Term: Able to explain why pulse checking is important during independent exercise;Long Term: Able to check pulse independently and accurately       Understanding of Exercise Prescription Yes       Intervention Provide education, explanation, and written materials on patient's individual exercise prescription       Expected Outcomes Short Term: Able to explain program exercise prescription;Long Term: Able to explain home exercise prescription to exercise independently  Exercise Goals Re-Evaluation :   Discharge Exercise Prescription (Final Exercise Prescription Changes):  Exercise Prescription Changes - 05/30/20 1700      Response to Exercise   Blood Pressure (Admit) 114/70    Blood Pressure (Exercise) 120/68    Blood Pressure (Exit) 116/68    Heart Rate (Admit) 69 bpm    Heart Rate (Exercise) 81 bpm    Heart Rate (Exit) 67 bpm    Oxygen Saturation (Admit) 98 %    Oxygen Saturation (Exercise) 97 %    Oxygen Saturation (Exit) 97 %    Rating of Perceived Exertion (Exercise) 13    Perceived Dyspnea (Exercise) 3    Symptoms SOB, L. foot pain 4/10    Comments walk test results           Nutrition:  Target Goals: Understanding of nutrition guidelines, daily intake of sodium 1500mg , cholesterol 200mg , calories 30% from fat and 7% or less from saturated fats, daily to have 5 or more servings of fruits and vegetables.  Education: All  About Nutrition: -Group instruction provided by verbal, written material, interactive activities, discussions, models, and posters to present general guidelines for heart healthy nutrition including fat, fiber, MyPlate, the role of sodium in heart healthy nutrition, utilization of the nutrition label, and utilization of this knowledge for meal planning. Follow up email sent as well. Written material given at graduation.   Biometrics:  Pre Biometrics - 05/30/20 1700      Pre Biometrics   Height 5' 0.5" (1.537 m)    Weight 177 lb 9.6 oz (80.6 kg)    BMI (Calculated) 34.1    Single Leg Stand 13.4 seconds            Nutrition Therapy Plan and Nutrition Goals:   Nutrition Assessments:  MEDIFICTS Score Key:  ?70 Need to make dietary changes   40-70 Heart Healthy Diet  ? 40 Therapeutic Level Cholesterol Diet  Flowsheet Row Cardiac Rehab from 05/30/2020 in [facility] Cardiac and Pulmonary Rehab  Picture Your Plate Total Score on Admission 59     Picture Your Plate Scores:  <10 Unhealthy dietary pattern with much room for improvement.  41-50 Dietary pattern unlikely to meet recommendations for good health and room for improvement.  51-60 More healthful dietary pattern, with some room for improvement.   >60 Healthy dietary pattern, although there may be some specific behaviors that could be improved.    Nutrition Goals Re-Evaluation:   Nutrition Goals Discharge (Final Nutrition Goals Re-Evaluation):   Psychosocial: Target Goals: Acknowledge presence or absence of significant depression and/or stress, maximize coping skills, provide positive support system. Participant is able to verbalize types and ability to use techniques and skills needed for reducing stress and depression.   Education: Stress, Anxiety, and Depression - Group verbal and visual presentation to define topics covered.  Reviews how body is impacted by stress, anxiety, and depression.  Also discusses healthy ways  to reduce stress and to treat/manage anxiety and depression.  Written material given at graduation.   Education: Sleep Hygiene -Provides group verbal and written instruction about how sleep can affect your health.  Define sleep hygiene, discuss sleep cycles and impact of sleep habits. Review good sleep hygiene tips.    Initial Review & Psychosocial Screening:  Initial Psych Review & Screening - 05/17/20 1514      Initial Review   Current issues with Current Stress Concerns;History of Depression;Current Anxiety/Panic    Source of Stress Concerns Unable to participate in former interests  or hobbies;Unable to perform yard/household activities      [place]? Yes      Barriers   Psychosocial barriers to participate in program There are no identifiable barriers or psychosocial needs.;The patient should benefit from training in stress management and relaxation.      Screening Interventions   Interventions Provide feedback about the scores to participant    Expected Outcomes Short Term goal: Utilizing psychosocial counselor, staff and physician to assist with identification of specific Stressors or current issues interfering with healing process. Setting desired goal for each stressor or current issue identified.;Long Term Goal: Stressors or current issues are controlled or eliminated.;Short Term goal: Identification and review with participant of any Quality of Life or Depression concerns found by scoring the questionnaire.;Long Term goal: The participant improves quality of Life and PHQ9 Scores as seen by post scores and/or verbalization of changes           Quality of Life Scores:   Quality of Life - 05/30/20 1658      Quality of Life   Select Quality of Life      Quality of Life Scores   Health/Function Pre 17.27 %    Socioeconomic Pre 28.07 %    Psych/Spiritual Pre 28.29 %    Family Pre 25.2 %    GLOBAL Pre 22.93 %          Scores of 19 and below  usually indicate a poorer quality of life in these areas.  A difference of  2-3 points is a clinically meaningful difference.  A difference of 2-3 points in the total score of the Quality of Life Index has been associated with significant improvement in overall quality of life, self-image, physical symptoms, and general health in studies assessing change in quality of life.  PHQ-9: Recent Review Flowsheet Data    Depression screen [facility] 2/9 05/30/2020 11/13/2019 02/17/2019 07/05/2017 12/18/2016   Decreased Interest 1 1 1 1 1    Down, Depressed, Hopeless 0 0 1 1 1    PHQ - 2 Score 1 1 2 2 2    Altered sleeping 3  3 1 3 2    Tired, decreased energy 3  1 1 2 2    Change in appetite 1 1 1 1  0   Feeling bad or failure about yourself  1 0 1 0 1   Trouble concentrating 0 0 1 0 1   Moving slowly or fidgety/restless 0 0 0 0 1   Suicidal thoughts 0 0 0 0 0   PHQ-9 Score 9 6 7 8 9    Difficult doing work/chores Somewhat difficult - Not difficult at all Somewhat difficult Somewhat difficult     Interpretation of Total Score  Total Score Depression Severity:  1-4 = Minimal depression, 5-9 = Mild depression, 10-14 = Moderate depression, 15-19 = Moderately severe depression, 20-27 = Severe depression   Psychosocial Evaluation and Intervention:  Psychosocial Evaluation - 05/17/20 1525      Psychosocial Evaluation & Interventions   Interventions Encouraged to exercise with the program and follow exercise prescription;Stress management education;Relaxation education    Comments Brandi Acevedo reports doing "okay" after her stents in October. She has had some insurance approval issues that she is hoping are almost resolved so she can finally come to cardiac rehab. She is also trying to get approved for disability. She has had an MI in 2007 and did cardiac rehab then. Since then, she has had both achilles worked on and the left  foot did not heal properly so she is waiting on cardiology to approve her to get the surgery redone.  This pain limits her mobility and has caused some balance issues. She really wants to work on stamina and strength. She also may start thinking about quitting smoking but is hesitant because about 5 years ago when she tried to quit using medication it caused issues with her depression and she admitted herself to behavioral health for a night. Her mental health is going well and she works on staying focused to better her health. Her depression medication is working well. Her sleep is disturbed by her restless leg syndrome, but if she takes her medication she feels like she slept well.    Expected Outcomes Short: attend cardiac rehab for education and exercise. Long: develop and maintain positive self care habits.    Continue Psychosocial Services  Follow up required by staff           Psychosocial Re-Evaluation:   Psychosocial Discharge (Final Psychosocial Re-Evaluation):   Vocational Rehabilitation: Provide vocational rehab assistance to qualifying candidates.   Vocational Rehab Evaluation & Intervention:  Vocational Rehab - 05/17/20 1525      Initial Vocational Rehab Evaluation & Intervention   Assessment shows need for Vocational Rehabilitation No           Education: Education Goals: Education classes will be provided on a variety of topics geared toward better understanding of heart health and risk factor modification. Participant will state understanding/return demonstration of topics presented as noted by education test scores.  Learning Barriers/Preferences:  Learning Barriers/Preferences - 05/17/20 1510      Learning Barriers/Preferences   Learning Barriers None    Learning Preferences None           General Cardiac Education Topics:  AED/CPR: - Group verbal and written instruction with the use of models to demonstrate the basic use of the AED with the basic ABC's of resuscitation.   Anatomy and Cardiac Procedures: - Group verbal and visual presentation and  models provide information about basic cardiac anatomy and function. Reviews the testing methods done to diagnose heart disease and the outcomes of the test results. Describes the treatment choices: Medical Management, Angioplasty, or Coronary Bypass Surgery for treating various heart conditions including Myocardial Infarction, Angina, Valve Disease, and Cardiac Arrhythmias.  Written material given at graduation.   Medication Safety: - Group verbal and visual instruction to review commonly prescribed medications for heart and lung disease. Reviews the medication, class of the drug, and side effects. Includes the steps to properly store meds and maintain the prescription regimen.  Written material given at graduation.   Intimacy: - Group verbal instruction through game format to discuss how heart and lung disease can affect sexual intimacy. Written material given at graduation..   Know Your Numbers and Heart Failure: - Group verbal and visual instruction to discuss disease risk factors for cardiac and pulmonary disease and treatment options.  Reviews associated critical values for Overweight/Obesity, Hypertension, Cholesterol, and Diabetes.  Discusses basics of heart failure: signs/symptoms and treatments.  Introduces Heart Failure Zone chart for action plan for heart failure.  Written material given at graduation.   Infection Prevention: - Provides verbal and written material to individual with discussion of infection control including proper hand washing and proper equipment cleaning during exercise session. Flowsheet Row Cardiac Rehab from 05/30/2020 in [facility] Cardiac and Pulmonary Rehab  Education need identified 05/30/20  Date 05/30/20  Educator [place]  Instruction Review Code 1- [employer]  Understanding      Falls Prevention: - Provides verbal and written material to individual with discussion of falls prevention and safety. Flowsheet Row Cardiac Rehab from 05/30/2020 in [facility] Cardiac and  Pulmonary Rehab  Education need identified 05/30/20  Date 05/30/20  Educator [place]  Instruction Review Code 1- Verbalizes Understanding      Other: -Provides group and verbal instruction on various topics (see comments)   Knowledge Questionnaire Score:  Knowledge Questionnaire Score - 05/30/20 1655      Knowledge Questionnaire Score   Pre Score 23/26: Angina, Exercise, Nutrition           Core Components/Risk Factors/Patient Goals at Admission:  Personal Goals and Risk Factors at Admission - 05/30/20 1709      Core Components/Risk Factors/Patient Goals on Admission    Weight Management Yes;Weight Loss    Intervention Weight Management: Develop a combined nutrition and exercise program designed to reach desired caloric intake, while maintaining appropriate intake of nutrient and fiber, sodium and fats, and appropriate energy expenditure required for the weight goal.;Weight Management: Provide education and appropriate resources to help participant work on and attain dietary goals.;Weight Management/Obesity: Establish reasonable short term and long term weight goals.    Admit Weight 177 lb (80.3 kg)    Goal Weight: Short Term 172 lb (78 kg)    Goal Weight: Long Term 167 lb (75.8 kg)    Expected Outcomes Short Term: Continue to assess and modify interventions until short term weight is achieved;Long Term: Adherence to nutrition and physical activity/exercise program aimed toward attainment of established weight goal;Weight Loss: Understanding of general recommendations for a balanced deficit meal plan, which promotes 1-2 lb weight loss per week and includes a negative energy balance of [phone] kcal/d;Understanding recommendations for meals to include 15-35% energy as protein, 25-35% energy from fat, 35-60% energy from carbohydrates, less than 200mg  of dietary cholesterol, 20-35 gm of total fiber daily;Understanding of distribution of calorie intake throughout the day with the consumption of  4-5 meals/snacks    Tobacco Cessation Yes    Number of packs per day ~10 cigs/ day    Intervention Assist the participant in steps to quit. Provide individualized education and counseling about committing to Tobacco Cessation, relapse prevention, and pharmacological support that can be provided by physician.;Advice worker, assist with locating and accessing local/national Quit Smoking programs, and support quit date choice.    Expected Outcomes Short Term: Will demonstrate readiness to quit, by selecting a quit date.;Short Term: Will quit all tobacco product use, adhering to prevention of relapse plan.;Long Term: Complete abstinence from all tobacco products for at least 12 months from quit date.    Diabetes Yes    Intervention Provide education about signs/symptoms and action to take for hypo/hyperglycemia.;Provide education about proper nutrition, including hydration, and aerobic/resistive exercise prescription along with prescribed medications to achieve blood glucose in normal ranges: Fasting glucose 65-99 mg/dL    Expected Outcomes Short Term: Participant verbalizes understanding of the signs/symptoms and immediate care of hyper/hypoglycemia, proper foot care and importance of medication, aerobic/resistive exercise and nutrition plan for blood glucose control.;Long Term: Attainment of HbA1C < 7%.    Hypertension Yes    Intervention Provide education on lifestyle modifcations including regular physical activity/exercise, weight management, moderate sodium restriction and increased consumption of fresh fruit, vegetables, and low fat dairy, alcohol moderation, and smoking cessation.;Monitor prescription use compliance.    Expected Outcomes Short Term: Continued assessment and intervention until BP is < 140/45mm HG in hypertensive participants. <  130/58mm HG in hypertensive participants with diabetes, heart failure or chronic kidney disease.;Long Term: Maintenance of blood pressure at goal  levels.    Lipids Yes    Intervention Provide education and support for participant on nutrition & aerobic/resistive exercise along with prescribed medications to achieve LDL 70mg , HDL >40mg .    Expected Outcomes Short Term: Participant states understanding of desired cholesterol values and is compliant with medications prescribed. Participant is following exercise prescription and nutrition guidelines.;Long Term: Cholesterol controlled with medications as prescribed, with individualized exercise RX and with personalized nutrition plan. Value goals: LDL < 70mg , HDL > 40 mg.           Education:Diabetes - Individual verbal and written instruction to review signs/symptoms of diabetes, desired ranges of glucose level fasting, after meals and with exercise. Acknowledge that pre and post exercise glucose checks will be done for 3 sessions at entry of program. Flowsheet Row Cardiac Rehab from 05/17/2020 in [facility]  Date 05/17/20  Educator [facility]  Instruction Review Code 1- Verbalizes Understanding      Core Components/Risk Factors/Patient Goals Review:    Core Components/Risk Factors/Patient Goals at Discharge (Final Review):    ITP Comments:  ITP Comments    Row Name 05/17/20 1532 05/30/20 1643         ITP Comments Initial telephone orientation completed. Diagnosis can be found in [facility] 1/26. EP orientation scheduled for Thursday 3/17 at 3pm Completed 6MWT and gym orientation. Initial ITP created and sent for review to Dr. Emily Filbert, Medical Director.             Comments: Initial ITP

## 2020-05-30 NOTE — Patient Instructions (Signed)
Patient Instructions  Patient Details  Name: Brandi Acevedo MRN: [id number] Date of Birth: 1965-11-25 Referring Provider:  Isaias Cowman, MD  Below are your personal goals for exercise, nutrition, and risk factors. Our goal is to help you stay on track towards obtaining and maintaining these goals. We will be discussing your progress on these goals with you throughout the program.  Initial Exercise Prescription:  Initial Exercise Prescription - 05/30/20 1700      Date of Initial Exercise RX and Referring Provider   Date 05/30/20    Referring Provider Paraschos, Alexander MD      Recumbant Bike   Level 1    RPM 60    Watts 10    Minutes 15    METs 1.2      NuStep   Level 1    SPM 80    Minutes 15    METs 1.2      Biostep-RELP   Level 1    SPM 50    Minutes 15    METs 1.2      Prescription Details   Frequency (times per week) 3    Duration Progress to 30 minutes of continuous aerobic without signs/symptoms of physical distress      Intensity   THRR 40-80% of Max Heartrate 107-146    Ratings of Perceived Exertion 11-13    Perceived Dyspnea 0-4      Progression   Progression Continue to progress workloads to maintain intensity without signs/symptoms of physical distress.      Resistance Training   Training Prescription Yes    Weight 3 lb    Reps 10-15           Exercise Goals: Frequency: Be able to perform aerobic exercise two to three times per week in program working toward 2-5 days per week of home exercise.  Intensity: Work with a perceived exertion of 11 (fairly light) - 15 (hard) while following your exercise prescription.  We will make changes to your prescription with you as you progress through the program.   Duration: Be able to do 30 to 45 minutes of continuous aerobic exercise in addition to a 5 minute warm-up and a 5 minute cool-down routine.   Nutrition Goals: Your personal nutrition goals will be established when you do your  nutrition analysis with the dietician.  The following are general nutrition guidelines to follow: Cholesterol < 200mg /day Sodium < 1500mg /day Fiber: Women over 50 yrs - 21 grams per day  Personal Goals:  Personal Goals and Risk Factors at Admission - 05/30/20 1709      Core Components/Risk Factors/Patient Goals on Admission    Weight Management Yes;Weight Loss    Intervention Weight Management: Develop a combined nutrition and exercise program designed to reach desired caloric intake, while maintaining appropriate intake of nutrient and fiber, sodium and fats, and appropriate energy expenditure required for the weight goal.;Weight Management: Provide education and appropriate resources to help participant work on and attain dietary goals.;Weight Management/Obesity: Establish reasonable short term and long term weight goals.    Admit Weight 177 lb (80.3 kg)    Goal Weight: Short Term 172 lb (78 kg)    Goal Weight: Long Term 167 lb (75.8 kg)    Expected Outcomes Short Term: Continue to assess and modify interventions until short term weight is achieved;Long Term: Adherence to nutrition and physical activity/exercise program aimed toward attainment of established weight goal;Weight Loss: Understanding of general recommendations for a balanced deficit meal plan, which  promotes 1-2 lb weight loss per week and includes a negative energy balance of [phone] kcal/d;Understanding recommendations for meals to include 15-35% energy as protein, 25-35% energy from fat, 35-60% energy from carbohydrates, less than 200mg  of dietary cholesterol, 20-35 gm of total fiber daily;Understanding of distribution of calorie intake throughout the day with the consumption of 4-5 meals/snacks    Tobacco Cessation Yes    Number of packs per day ~10 cigs/ day    Intervention Assist the participant in steps to quit. Provide individualized education and counseling about committing to Tobacco Cessation, relapse prevention, and  pharmacological support that can be provided by physician.;Advice worker, assist with locating and accessing local/national Quit Smoking programs, and support quit date choice.    Expected Outcomes Short Term: Will demonstrate readiness to quit, by selecting a quit date.;Short Term: Will quit all tobacco product use, adhering to prevention of relapse plan.;Long Term: Complete abstinence from all tobacco products for at least 12 months from quit date.    Diabetes Yes    Intervention Provide education about signs/symptoms and action to take for hypo/hyperglycemia.;Provide education about proper nutrition, including hydration, and aerobic/resistive exercise prescription along with prescribed medications to achieve blood glucose in normal ranges: Fasting glucose 65-99 mg/dL    Expected Outcomes Short Term: Participant verbalizes understanding of the signs/symptoms and immediate care of hyper/hypoglycemia, proper foot care and importance of medication, aerobic/resistive exercise and nutrition plan for blood glucose control.;Long Term: Attainment of HbA1C < 7%.    Hypertension Yes    Intervention Provide education on lifestyle modifcations including regular physical activity/exercise, weight management, moderate sodium restriction and increased consumption of fresh fruit, vegetables, and low fat dairy, alcohol moderation, and smoking cessation.;Monitor prescription use compliance.    Expected Outcomes Short Term: Continued assessment and intervention until BP is < 140/61mm HG in hypertensive participants. < 130/4mm HG in hypertensive participants with diabetes, heart failure or chronic kidney disease.;Long Term: Maintenance of blood pressure at goal levels.    Lipids Yes    Intervention Provide education and support for participant on nutrition & aerobic/resistive exercise along with prescribed medications to achieve LDL 70mg , HDL >40mg .    Expected Outcomes Short Term: Participant states  understanding of desired cholesterol values and is compliant with medications prescribed. Participant is following exercise prescription and nutrition guidelines.;Long Term: Cholesterol controlled with medications as prescribed, with individualized exercise RX and with personalized nutrition plan. Value goals: LDL < 70mg , HDL > 40 mg.           Tobacco Use Initial Evaluation: Social History   Tobacco Use  Smoking Status Current Every Day Smoker  . Packs/day: 0.50  . Years: 30.00  . Pack years: 15.00  . Types: Cigarettes  Smokeless Tobacco Never Used    Exercise Goals and Review:  Exercise Goals    Row Name 05/30/20 1708             Exercise Goals   Increase Physical Activity Yes       Intervention Provide advice, education, support and counseling about physical activity/exercise needs.;Develop an individualized exercise prescription for aerobic and resistive training based on initial evaluation findings, risk stratification, comorbidities and participant's personal goals.       Expected Outcomes Short Term: Attend rehab on a regular basis to increase amount of physical activity.;Long Term: Add in home exercise to make exercise part of routine and to increase amount of physical activity.;Long Term: Exercising regularly at least 3-5 days a week.  Increase Strength and Stamina Yes       Intervention Provide advice, education, support and counseling about physical activity/exercise needs.;Develop an individualized exercise prescription for aerobic and resistive training based on initial evaluation findings, risk stratification, comorbidities and participant's personal goals.       Expected Outcomes Short Term: Increase workloads from initial exercise prescription for resistance, speed, and METs.;Short Term: Perform resistance training exercises routinely during rehab and add in resistance training at home;Long Term: Improve cardiorespiratory fitness, muscular endurance and strength  as measured by increased METs and functional capacity (6MWT)       Able to understand and use rate of perceived exertion (RPE) scale Yes       Intervention Provide education and explanation on how to use RPE scale       Expected Outcomes Short Term: Able to use RPE daily in rehab to express subjective intensity level;Long Term:  Able to use RPE to guide intensity level when exercising independently       Able to understand and use Dyspnea scale Yes       Intervention Provide education and explanation on how to use Dyspnea scale       Expected Outcomes Short Term: Able to use Dyspnea scale daily in rehab to express subjective sense of shortness of breath during exertion;Long Term: Able to use Dyspnea scale to guide intensity level when exercising independently       Knowledge and understanding of Target Heart Rate Range (THRR) Yes       Intervention Provide education and explanation of THRR including how the numbers were predicted and where they are located for reference       Expected Outcomes Short Term: Able to state/look up THRR;Short Term: Able to use daily as guideline for intensity in rehab;Long Term: Able to use THRR to govern intensity when exercising independently       Able to check pulse independently Yes       Intervention Provide education and demonstration on how to check pulse in carotid and radial arteries.;Review the importance of being able to check your own pulse for safety during independent exercise       Expected Outcomes Short Term: Able to explain why pulse checking is important during independent exercise;Long Term: Able to check pulse independently and accurately       Understanding of Exercise Prescription Yes       Intervention Provide education, explanation, and written materials on patient's individual exercise prescription       Expected Outcomes Short Term: Able to explain program exercise prescription;Long Term: Able to explain home exercise prescription to exercise  independently              Copy of goals given to participant.

## 2020-06-05 ENCOUNTER — Encounter: Payer: Self-pay | Admitting: *Deleted

## 2020-06-05 DIAGNOSIS — Z955 Presence of coronary angioplasty implant and graft: Secondary | ICD-10-CM

## 2020-06-05 NOTE — Progress Notes (Signed)
Cardiac Individual Treatment Plan  Patient Details  Name: MALLISA ALAMEDA MRN: [id number] Date of Birth: 1966-02-07 Referring Provider:   Flowsheet Row Cardiac Rehab from 05/30/2020 in [facility] Cardiac and Pulmonary Rehab  Referring Provider Isaias Cowman MD      Initial Encounter Date:  Flowsheet Row Cardiac Rehab from 05/30/2020 in [facility] Cardiac and Pulmonary Rehab  Date 05/30/20      Visit Diagnosis: Status post coronary artery stent placement  Patient's Home Medications on Admission:  Current Outpatient Medications:  .  aspirin 81 MG chewable tablet, Chew 1 tablet (81 mg total) by mouth daily., Disp: 30 tablet, Rfl: 6 .  citalopram (CELEXA) 40 MG tablet, Take 1 tablet by mouth once daily, Disp: 90 tablet, Rfl: 0 .  diclofenac Sodium (VOLTAREN) 1 % GEL, Apply 2 g topically daily as needed (pain). (Patient not taking: Reported on 05/17/2020), Disp: , Rfl:  .  ezetimibe (ZETIA) 10 MG tablet, Take 1 tablet (10 mg total) by mouth daily. (Patient taking differently: Take 10 mg by mouth at bedtime.), Disp: 90 tablet, Rfl: 0 .  fenofibrate 160 MG tablet, Take 1 tablet (160 mg total) by mouth daily. (Patient taking differently: Take 160 mg by mouth at bedtime.), Disp: 90 tablet, Rfl: 0 .  gabapentin (NEURONTIN) 300 MG capsule, Take 300 mg by mouth daily., Disp: , Rfl:  .  gemfibrozil (LOPID) 600 MG tablet, TAKE 1 TABLET BY MOUTH TWICE DAILY BEFORE A MEAL (Patient taking differently: Take 600 mg by mouth 2 (two) times daily before a meal. TAKE 1 TABLET BY MOUTH TWICE DAILY BEFORE A MEA), Disp: 180 tablet, Rfl: 0 .  hydrOXYzine (ATARAX/VISTARIL) 25 MG tablet, Take 1 tablet (25 mg total) by mouth 3 (three) times daily as needed. (Patient taking differently: Take 25 mg by mouth at bedtime.), Disp: 270 tablet, Rfl: 0 .  pantoprazole (PROTONIX) 40 MG tablet, Take 1 tablet (40 mg total) by mouth daily. (Patient taking differently: Take 40 mg by mouth at bedtime.), Disp: 90 tablet, Rfl: 0 .   rOPINIRole (REQUIP) 1 MG tablet, Take 1 mg by mouth at bedtime., Disp: , Rfl:  .  rOPINIRole (REQUIP) 2 MG tablet, Take 2 mg by mouth at bedtime., Disp: , Rfl:  .  ticagrelor (BRILINTA) 90 MG TABS tablet, Take 1 tablet (90 mg total) by mouth 2 (two) times daily., Disp: 60 tablet, Rfl: 6  Past Medical History: Past Medical History:  Diagnosis Date  . Anginal pain ([place])   . Anxiety   . COPD (chronic obstructive pulmonary disease) ([place])   . Deaf, left   . Depression   . Diabetes mellitus without complication ([place])   . GERD (gastroesophageal reflux disease)   . Hearing loss   . Heart attack ([place])    2007  . Hypertension   . IBS (irritable bowel syndrome)   . Pre-diabetes   . RLS (restless legs syndrome)   . RLS (restless legs syndrome)   . Seizure ([place])    x1 - attributed to use of atorvastatin  . Self-inflicted injury 11/19/6211  . Sleep apnea    no CPAP  . Suicidal ideation 05/16/2015  . Trigger finger of left thumb   . Wears dentures    full upper  . Wears hearing aid in right ear    has, does not wear    Tobacco Use: Social History   Tobacco Use  Smoking Status Current Every Day Smoker  . Packs/day: 0.50  . Years: 30.00  . Pack years: 15.00  .  Types: Cigarettes  Smokeless Tobacco Never Used    Labs: Recent Review Flowsheet Data    Labs for ITP Cardiac and Pulmonary Rehab Latest Ref Rng & Units 12/17/2015 08/28/2016 11/18/2016 10/07/2018 11/13/2019   Cholestrol 100 - 199 mg/dL 256(H) 251(H) 215(H) 211(H) 178   LDLCALC 0 - 99 mg/dL 156(H) 163(H) 118(H) 110(H) 119(H)   HDL >39 mg/dL 32(L) 35(L) 34(L) 32(L) 32(L)   Trlycerides 0 - 149 mg/dL 338(H) 267(H) 315(H) 345(H) 153(H)   Hemoglobin A1c <7.0 % 6.1(H) 5.5 - 6.3 6.6       Exercise Target Goals: Exercise Program Goal: Individual exercise prescription set using results from initial 6 min walk test and THRR while considering  patient's activity barriers and safety.   Exercise Prescription Goal: Initial exercise  prescription builds to 30-45 minutes a day of aerobic activity, 2-3 days per week.  Home exercise guidelines will be given to patient during program as part of exercise prescription that the participant will acknowledge.   Education: Aerobic Exercise: - Group verbal and visual presentation on the components of exercise prescription. Introduces F.I.T.T principle from ACSM for exercise prescriptions.  Reviews F.I.T.T. principles of aerobic exercise including progression. Written material given at graduation.   Education: Resistance Exercise: - Group verbal and visual presentation on the components of exercise prescription. Introduces F.I.T.T principle from ACSM for exercise prescriptions  Reviews F.I.T.T. principles of resistance exercise including progression. Written material given at graduation.    Education: Exercise & Equipment Safety: - Individual verbal instruction and demonstration of equipment use and safety with use of the equipment. Flowsheet Row Cardiac Rehab from 05/30/2020 in [facility] Cardiac and Pulmonary Rehab  Education need identified 05/30/20  Date 05/30/20  Educator [place]  Instruction Review Code 1- Verbalizes Understanding      Education: Exercise Physiology & General Exercise Guidelines: - Group verbal and written instruction with models to review the exercise physiology of the cardiovascular system and associated critical values. Provides general exercise guidelines with specific guidelines to those with heart or lung disease.    Education: Flexibility, Balance, Mind/Body Relaxation: - Group verbal and visual presentation with interactive activity on the components of exercise prescription. Introduces F.I.T.T principle from ACSM for exercise prescriptions. Reviews F.I.T.T. principles of flexibility and balance exercise training including progression. Also discusses the mind body connection.  Reviews various relaxation techniques to help reduce and manage stress (i.e. Deep  breathing, progressive muscle relaxation, and visualization). Balance handout provided to take home. Written material given at graduation.   Activity Barriers & Risk Stratification:  Activity Barriers & Cardiac Risk Stratification - 05/30/20 1700      Activity Barriers & Cardiac Risk Stratification   Activity Barriers Joint Problems;Balance Concerns;Shortness of Breath;Muscular Weakness;History of Falls;Other (comment)    Comments Left foot pain due to lack of healing from achilles repair    Cardiac Risk Stratification High           6 Minute Walk:  6 Minute Walk    Row Name 05/30/20 1701         6 Minute Walk   Phase Initial     Distance 600 feet     Walk Time 3.8 minutes  2 breaks; stopped at 4:45 min     # of Rest Breaks 3     MPH 1.79     METS 1.2     RPE 13     Perceived Dyspnea  3     VO2 Peak 4.21     Symptoms Yes (comment)  Comments SOB (stopped test), left foot pain 4/10     Resting HR 69 bpm     Resting BP 114/70     Resting Oxygen Saturation  98 %     Exercise Oxygen Saturation  during 6 min walk 97 %     Max Ex. HR 81 bpm     Max Ex. BP 120/68     2 Minute Post BP 116/68            Oxygen Initial Assessment:   Oxygen Re-Evaluation:   Oxygen Discharge (Final Oxygen Re-Evaluation):   Initial Exercise Prescription:  Initial Exercise Prescription - 05/30/20 1700      Date of Initial Exercise RX and Referring Provider   Date 05/30/20    Referring Provider Paraschos, Alexander MD      Recumbant Bike   Level 1    RPM 60    Watts 10    Minutes 15    METs 1.2      NuStep   Level 1    SPM 80    Minutes 15    METs 1.2      Biostep-RELP   Level 1    SPM 50    Minutes 15    METs 1.2      Prescription Details   Frequency (times per week) 3    Duration Progress to 30 minutes of continuous aerobic without signs/symptoms of physical distress      Intensity   THRR 40-80% of Max Heartrate 107-146    Ratings of Perceived Exertion 11-13     Perceived Dyspnea 0-4      Progression   Progression Continue to progress workloads to maintain intensity without signs/symptoms of physical distress.      Resistance Training   Training Prescription Yes    Weight 3 lb    Reps 10-15           Perform Capillary Blood Glucose checks as needed.  Exercise Prescription Changes:  Exercise Prescription Changes    Row Name 05/30/20 1700             Response to Exercise   Blood Pressure (Admit) 114/70       Blood Pressure (Exercise) 120/68       Blood Pressure (Exit) 116/68       Heart Rate (Admit) 69 bpm       Heart Rate (Exercise) 81 bpm       Heart Rate (Exit) 67 bpm       Oxygen Saturation (Admit) 98 %       Oxygen Saturation (Exercise) 97 %       Oxygen Saturation (Exit) 97 %       Rating of Perceived Exertion (Exercise) 13       Perceived Dyspnea (Exercise) 3       Symptoms SOB, L. foot pain 4/10       Comments walk test results              Exercise Comments:   Exercise Goals and Review:  Exercise Goals    Row Name 05/30/20 1708             Exercise Goals   Increase Physical Activity Yes       Intervention Provide advice, education, support and counseling about physical activity/exercise needs.;Develop an individualized exercise prescription for aerobic and resistive training based on initial evaluation findings, risk stratification, comorbidities and participant's personal goals.       Expected Outcomes Short  Term: Attend rehab on a regular basis to increase amount of physical activity.;Long Term: Add in home exercise to make exercise part of routine and to increase amount of physical activity.;Long Term: Exercising regularly at least 3-5 days a week.       Increase Strength and Stamina Yes       Intervention Provide advice, education, support and counseling about physical activity/exercise needs.;Develop an individualized exercise prescription for aerobic and resistive training based on initial evaluation  findings, risk stratification, comorbidities and participant's personal goals.       Expected Outcomes Short Term: Increase workloads from initial exercise prescription for resistance, speed, and METs.;Short Term: Perform resistance training exercises routinely during rehab and add in resistance training at home;Long Term: Improve cardiorespiratory fitness, muscular endurance and strength as measured by increased METs and functional capacity (6MWT)       Able to understand and use rate of perceived exertion (RPE) scale Yes       Intervention Provide education and explanation on how to use RPE scale       Expected Outcomes Short Term: Able to use RPE daily in rehab to express subjective intensity level;Long Term:  Able to use RPE to guide intensity level when exercising independently       Able to understand and use Dyspnea scale Yes       Intervention Provide education and explanation on how to use Dyspnea scale       Expected Outcomes Short Term: Able to use Dyspnea scale daily in rehab to express subjective sense of shortness of breath during exertion;Long Term: Able to use Dyspnea scale to guide intensity level when exercising independently       Knowledge and understanding of Target Heart Rate Range (THRR) Yes       Intervention Provide education and explanation of THRR including how the numbers were predicted and where they are located for reference       Expected Outcomes Short Term: Able to state/look up THRR;Short Term: Able to use daily as guideline for intensity in rehab;Long Term: Able to use THRR to govern intensity when exercising independently       Able to check pulse independently Yes       Intervention Provide education and demonstration on how to check pulse in carotid and radial arteries.;Review the importance of being able to check your own pulse for safety during independent exercise       Expected Outcomes Short Term: Able to explain why pulse checking is important during  independent exercise;Long Term: Able to check pulse independently and accurately       Understanding of Exercise Prescription Yes       Intervention Provide education, explanation, and written materials on patient's individual exercise prescription       Expected Outcomes Short Term: Able to explain program exercise prescription;Long Term: Able to explain home exercise prescription to exercise independently              Exercise Goals Re-Evaluation :   Discharge Exercise Prescription (Final Exercise Prescription Changes):  Exercise Prescription Changes - 05/30/20 1700      Response to Exercise   Blood Pressure (Admit) 114/70    Blood Pressure (Exercise) 120/68    Blood Pressure (Exit) 116/68    Heart Rate (Admit) 69 bpm    Heart Rate (Exercise) 81 bpm    Heart Rate (Exit) 67 bpm    Oxygen Saturation (Admit) 98 %    Oxygen Saturation (Exercise) 97 %  Oxygen Saturation (Exit) 97 %    Rating of Perceived Exertion (Exercise) 13    Perceived Dyspnea (Exercise) 3    Symptoms SOB, L. foot pain 4/10    Comments walk test results           Nutrition:  Target Goals: Understanding of nutrition guidelines, daily intake of sodium 1500mg , cholesterol 200mg , calories 30% from fat and 7% or less from saturated fats, daily to have 5 or more servings of fruits and vegetables.  Education: All About Nutrition: -Group instruction provided by verbal, written material, interactive activities, discussions, models, and posters to present general guidelines for heart healthy nutrition including fat, fiber, MyPlate, the role of sodium in heart healthy nutrition, utilization of the nutrition label, and utilization of this knowledge for meal planning. Follow up email sent as well. Written material given at graduation.   Biometrics:  Pre Biometrics - 05/30/20 1700      Pre Biometrics   Height 5' 0.5" (1.537 m)    Weight 177 lb 9.6 oz (80.6 kg)    BMI (Calculated) 34.1    Single Leg Stand 13.4  seconds            Nutrition Therapy Plan and Nutrition Goals:   Nutrition Assessments:  MEDIFICTS Score Key:  ?70 Need to make dietary changes   40-70 Heart Healthy Diet  ? 40 Therapeutic Level Cholesterol Diet  Flowsheet Row Cardiac Rehab from 05/30/2020 in [facility] Cardiac and Pulmonary Rehab  Picture Your Plate Total Score on Admission 59     Picture Your Plate Scores:  <44 Unhealthy dietary pattern with much room for improvement.  41-50 Dietary pattern unlikely to meet recommendations for good health and room for improvement.  51-60 More healthful dietary pattern, with some room for improvement.   >60 Healthy dietary pattern, although there may be some specific behaviors that could be improved.    Nutrition Goals Re-Evaluation:   Nutrition Goals Discharge (Final Nutrition Goals Re-Evaluation):   Psychosocial: Target Goals: Acknowledge presence or absence of significant depression and/or stress, maximize coping skills, provide positive support system. Participant is able to verbalize types and ability to use techniques and skills needed for reducing stress and depression.   Education: Stress, Anxiety, and Depression - Group verbal and visual presentation to define topics covered.  Reviews how body is impacted by stress, anxiety, and depression.  Also discusses healthy ways to reduce stress and to treat/manage anxiety and depression.  Written material given at graduation.   Education: Sleep Hygiene -Provides group verbal and written instruction about how sleep can affect your health.  Define sleep hygiene, discuss sleep cycles and impact of sleep habits. Review good sleep hygiene tips.    Initial Review & Psychosocial Screening:  Initial Psych Review & Screening - 05/17/20 1514      Initial Review   Current issues with Current Stress Concerns;History of Depression;Current Anxiety/Panic    Source of Stress Concerns Unable to participate in former interests or  hobbies;Unable to perform yard/household activities      [place]? Yes      Barriers   Psychosocial barriers to participate in program There are no identifiable barriers or psychosocial needs.;The patient should benefit from training in stress management and relaxation.      Screening Interventions   Interventions Provide feedback about the scores to participant    Expected Outcomes Short Term goal: Utilizing psychosocial counselor, staff and physician to assist with identification of specific Stressors or current  issues interfering with healing process. Setting desired goal for each stressor or current issue identified.;Long Term Goal: Stressors or current issues are controlled or eliminated.;Short Term goal: Identification and review with participant of any Quality of Life or Depression concerns found by scoring the questionnaire.;Long Term goal: The participant improves quality of Life and PHQ9 Scores as seen by post scores and/or verbalization of changes           Quality of Life Scores:   Quality of Life - 05/30/20 1658      Quality of Life   Select Quality of Life      Quality of Life Scores   Health/Function Pre 17.27 %    Socioeconomic Pre 28.07 %    Psych/Spiritual Pre 28.29 %    Family Pre 25.2 %    GLOBAL Pre 22.93 %          Scores of 19 and below usually indicate a poorer quality of life in these areas.  A difference of  2-3 points is a clinically meaningful difference.  A difference of 2-3 points in the total score of the Quality of Life Index has been associated with significant improvement in overall quality of life, self-image, physical symptoms, and general health in studies assessing change in quality of life.  PHQ-9: Recent Review Flowsheet Data    Depression screen [facility] 2/9 05/30/2020 11/13/2019 02/17/2019 07/05/2017 12/18/2016   Decreased Interest 1 1 1 1 1    Down, Depressed, Hopeless 0 0 1 1 1    PHQ - 2 Score 1 1 2 2 2    Altered  sleeping 3  3 1 3 2    Tired, decreased energy 3  1 1 2 2    Change in appetite 1 1 1 1  0   Feeling bad or failure about yourself  1 0 1 0 1   Trouble concentrating 0 0 1 0 1   Moving slowly or fidgety/restless 0 0 0 0 1   Suicidal thoughts 0 0 0 0 0   PHQ-9 Score 9 6 7 8 9    Difficult doing work/chores Somewhat difficult - Not difficult at all Somewhat difficult Somewhat difficult     Interpretation of Total Score  Total Score Depression Severity:  1-4 = Minimal depression, 5-9 = Mild depression, 10-14 = Moderate depression, 15-19 = Moderately severe depression, 20-27 = Severe depression   Psychosocial Evaluation and Intervention:  Psychosocial Evaluation - 05/17/20 1525      Psychosocial Evaluation & Interventions   Interventions Encouraged to exercise with the program and follow exercise prescription;Stress management education;Relaxation education    Comments Arrie Aran reports doing "okay" after her stents in October. She has had some insurance approval issues that she is hoping are almost resolved so she can finally come to cardiac rehab. She is also trying to get approved for disability. She has had an MI in 2007 and did cardiac rehab then. Since then, she has had both achilles worked on and the left foot did not heal properly so she is waiting on cardiology to approve her to get the surgery redone. This pain limits her mobility and has caused some balance issues. She really wants to work on stamina and strength. She also may start thinking about quitting smoking but is hesitant because about 5 years ago when she tried to quit using medication it caused issues with her depression and she admitted herself to behavioral health for a night. Her mental health is going well and she works on staying focused to  better her health. Her depression medication is working well. Her sleep is disturbed by her restless leg syndrome, but if she takes her medication she feels like she slept well.    Expected  Outcomes Short: attend cardiac rehab for education and exercise. Long: develop and maintain positive self care habits.    Continue Psychosocial Services  Follow up required by staff           Psychosocial Re-Evaluation:   Psychosocial Discharge (Final Psychosocial Re-Evaluation):   Vocational Rehabilitation: Provide vocational rehab assistance to qualifying candidates.   Vocational Rehab Evaluation & Intervention:  Vocational Rehab - 05/17/20 1525      Initial Vocational Rehab Evaluation & Intervention   Assessment shows need for Vocational Rehabilitation No           Education: Education Goals: Education classes will be provided on a variety of topics geared toward better understanding of heart health and risk factor modification. Participant will state understanding/return demonstration of topics presented as noted by education test scores.  Learning Barriers/Preferences:  Learning Barriers/Preferences - 05/17/20 1510      Learning Barriers/Preferences   Learning Barriers None    Learning Preferences None           General Cardiac Education Topics:  AED/CPR: - Group verbal and written instruction with the use of models to demonstrate the basic use of the AED with the basic ABC's of resuscitation.   Anatomy and Cardiac Procedures: - Group verbal and visual presentation and models provide information about basic cardiac anatomy and function. Reviews the testing methods done to diagnose heart disease and the outcomes of the test results. Describes the treatment choices: Medical Management, Angioplasty, or Coronary Bypass Surgery for treating various heart conditions including Myocardial Infarction, Angina, Valve Disease, and Cardiac Arrhythmias.  Written material given at graduation.   Medication Safety: - Group verbal and visual instruction to review commonly prescribed medications for heart and lung disease. Reviews the medication, class of the drug, and side  effects. Includes the steps to properly store meds and maintain the prescription regimen.  Written material given at graduation.   Intimacy: - Group verbal instruction through game format to discuss how heart and lung disease can affect sexual intimacy. Written material given at graduation..   Know Your Numbers and Heart Failure: - Group verbal and visual instruction to discuss disease risk factors for cardiac and pulmonary disease and treatment options.  Reviews associated critical values for Overweight/Obesity, Hypertension, Cholesterol, and Diabetes.  Discusses basics of heart failure: signs/symptoms and treatments.  Introduces Heart Failure Zone chart for action plan for heart failure.  Written material given at graduation.   Infection Prevention: - Provides verbal and written material to individual with discussion of infection control including proper hand washing and proper equipment cleaning during exercise session. Flowsheet Row Cardiac Rehab from 05/30/2020 in [facility] Cardiac and Pulmonary Rehab  Education need identified 05/30/20  Date 05/30/20  Educator [place]  Instruction Review Code 1- Verbalizes Understanding      Falls Prevention: - Provides verbal and written material to individual with discussion of falls prevention and safety. Flowsheet Row Cardiac Rehab from 05/30/2020 in [facility] Cardiac and Pulmonary Rehab  Education need identified 05/30/20  Date 05/30/20  Educator [place]  Instruction Review Code 1- Verbalizes Understanding      Other: -Provides group and verbal instruction on various topics (see comments)   Knowledge Questionnaire Score:  Knowledge Questionnaire Score - 05/30/20 1655      Knowledge Questionnaire Score  Pre Score 23/26: Angina, Exercise, Nutrition           Core Components/Risk Factors/Patient Goals at Admission:  Personal Goals and Risk Factors at Admission - 05/30/20 1709      Core Components/Risk Factors/Patient Goals on Admission    Weight  Management Yes;Weight Loss    Intervention Weight Management: Develop a combined nutrition and exercise program designed to reach desired caloric intake, while maintaining appropriate intake of nutrient and fiber, sodium and fats, and appropriate energy expenditure required for the weight goal.;Weight Management: Provide education and appropriate resources to help participant work on and attain dietary goals.;Weight Management/Obesity: Establish reasonable short term and long term weight goals.    Admit Weight 177 lb (80.3 kg)    Goal Weight: Short Term 172 lb (78 kg)    Goal Weight: Long Term 167 lb (75.8 kg)    Expected Outcomes Short Term: Continue to assess and modify interventions until short term weight is achieved;Long Term: Adherence to nutrition and physical activity/exercise program aimed toward attainment of established weight goal;Weight Loss: Understanding of general recommendations for a balanced deficit meal plan, which promotes 1-2 lb weight loss per week and includes a negative energy balance of [id number] kcal/d;Understanding recommendations for meals to include 15-35% energy as protein, 25-35% energy from fat, 35-60% energy from carbohydrates, less than 200mg  of dietary cholesterol, 20-35 gm of total fiber daily;Understanding of distribution of calorie intake throughout the day with the consumption of 4-5 meals/snacks    Tobacco Cessation Yes    Number of packs per day ~10 cigs/ day    Intervention Assist the participant in steps to quit. Provide individualized education and counseling about committing to Tobacco Cessation, relapse prevention, and pharmacological support that can be provided by physician.;Advice worker, assist with locating and accessing local/national Quit Smoking programs, and support quit date choice.    Expected Outcomes Short Term: Will demonstrate readiness to quit, by selecting a quit date.;Short Term: Will quit all tobacco product use, adhering to  prevention of relapse plan.;Long Term: Complete abstinence from all tobacco products for at least 12 months from quit date.    Diabetes Yes    Intervention Provide education about signs/symptoms and action to take for hypo/hyperglycemia.;Provide education about proper nutrition, including hydration, and aerobic/resistive exercise prescription along with prescribed medications to achieve blood glucose in normal ranges: Fasting glucose 65-99 mg/dL    Expected Outcomes Short Term: Participant verbalizes understanding of the signs/symptoms and immediate care of hyper/hypoglycemia, proper foot care and importance of medication, aerobic/resistive exercise and nutrition plan for blood glucose control.;Long Term: Attainment of HbA1C < 7%.    Hypertension Yes    Intervention Provide education on lifestyle modifcations including regular physical activity/exercise, weight management, moderate sodium restriction and increased consumption of fresh fruit, vegetables, and low fat dairy, alcohol moderation, and smoking cessation.;Monitor prescription use compliance.    Expected Outcomes Short Term: Continued assessment and intervention until BP is < 140/74mm HG in hypertensive participants. < 130/24mm HG in hypertensive participants with diabetes, heart failure or chronic kidney disease.;Long Term: Maintenance of blood pressure at goal levels.    Lipids Yes    Intervention Provide education and support for participant on nutrition & aerobic/resistive exercise along with prescribed medications to achieve LDL 70mg , HDL >40mg .    Expected Outcomes Short Term: Participant states understanding of desired cholesterol values and is compliant with medications prescribed. Participant is following exercise prescription and nutrition guidelines.;Long Term: Cholesterol controlled with medications as prescribed, with individualized exercise RX  and with personalized nutrition plan. Value goals: LDL < 70mg , HDL > 40 mg.            Education:Diabetes - Individual verbal and written instruction to review signs/symptoms of diabetes, desired ranges of glucose level fasting, after meals and with exercise. Acknowledge that pre and post exercise glucose checks will be done for 3 sessions at entry of program. [place] from 05/17/2020 in [facility]  Date 05/17/20  Educator [facility]  Instruction Review Code 1- Verbalizes Understanding      Core Components/Risk Factors/Patient Goals Review:    Core Components/Risk Factors/Patient Goals at Discharge (Final Review):    ITP Comments:  ITP Comments    Row Name 05/17/20 1532 05/30/20 1643 05/30/20 1714 06/05/20 0739     ITP Comments Initial telephone orientation completed. Diagnosis can be found in [facility] 1/26. EP orientation scheduled for Thursday 3/17 at 3pm Completed 6MWT and gym orientation. Initial ITP created and sent for review to Dr. Emily Filbert, Medical Director. [name] is a current tobacco user. Intervention for tobacco cessation was provided at the initial medical review. She was asked about readiness to quit and reported she is ready to quit but has no start date yet . Patient reports she has nicotine patches and stopped using them, but plans to try to use them again. Patient was advised and educated about tobacco cessation using combination therapy, tobacco cessation classes, quit line, and quit smoking apps. Patient demonstrated understanding of this material. Staff will continue to provide encouragement and follow up with the patient throughout the program. 30 Day review completed. Medical Director ITP review done, changes made as directed, and signed approval by Medical Director.           Comments:

## 2020-06-10 ENCOUNTER — Other Ambulatory Visit: Payer: Self-pay

## 2020-06-10 ENCOUNTER — Encounter: Payer: Medicaid Other | Admitting: *Deleted

## 2020-06-10 DIAGNOSIS — Z955 Presence of coronary angioplasty implant and graft: Secondary | ICD-10-CM

## 2020-06-10 NOTE — Progress Notes (Signed)
Daily Session Note  Patient Details  Name: Brandi Acevedo MRN: [id number] Date of Birth: 05-Dec-1965 Referring Provider:   Flowsheet Row Cardiac Rehab from 05/30/2020 in [facility] Cardiac [facility] Pul[facility] Re[facility]  Referring Provider Isaias Cowman MD      Encounter Date: 06/10/2020  Check In:  Session Check In - 06/10/20 [place]      Check-In   Supervising physician immediately available to respond to emergencies See telemetry face sheet for immediately available ER MD    Location [facility]-Cardiac & Pulmonary Rehab    Staff Present Renita Papa, RN Moises Blood, BS, ACSM CEP, Exercise Physiologist;Amanda Oletta Darter, [place], ACSM CEP, Exercise Physiologist    Virtual Visit No    Medication changes reported     No    Fall or balance concerns reported    No    Tobacco Cessation No Change    Current number of cigarettes/nicotine per day     10    Warm-up and Cool-down Performed on first and last piece of equipment    Resistance Training Performed Yes    VAD Patient? No    PAD/SET Patient? No      Pain Assessment   Currently in Pain? No/denies              Social History   Tobacco Use  Smoking Status Current Every Day Smoker  . Packs/day: 0.50  . Years: 30.00  . Pack years: 15.00  . Types: Cigarettes  Smokeless Tobacco Never Used    Goals Met:  Independence with exercise equipment Exercise tolerated well No report of cardiac concerns or symptoms Strength training completed today  Goals Unmet:  Not Applicable  Comments: First full day of exercise!  Patient was oriented to gym and equipment including functions, settings, policies, and procedures.  Patient's individual exercise prescription and treatment plan were reviewed.  All starting workloads were established based on the results of the 6 minute walk test done at initial orientation visit.  The plan for exercise progression was also introduced and progression will be customized based on patient's performance and  goals.    Dr. Emily Filbert is Medical Director for [facility].

## 2020-06-17 ENCOUNTER — Encounter: Payer: Medicaid Other | Attending: Cardiology

## 2020-06-17 DIAGNOSIS — Z955 Presence of coronary angioplasty implant and graft: Secondary | ICD-10-CM | POA: Insufficient documentation

## 2020-06-26 ENCOUNTER — Telehealth: Payer: Self-pay

## 2020-06-26 NOTE — Telephone Encounter (Signed)
Husband has Covid - she is quarantining and will call us when she can return  She may start home health PT for balance

## 2020-07-02 DIAGNOSIS — Z955 Presence of coronary angioplasty implant and graft: Secondary | ICD-10-CM

## 2020-07-03 ENCOUNTER — Encounter: Payer: Self-pay | Admitting: *Deleted

## 2020-07-03 DIAGNOSIS — Z955 Presence of coronary angioplasty implant and graft: Secondary | ICD-10-CM

## 2020-07-03 NOTE — Progress Notes (Signed)
Cardiac Individual Treatment Plan  Patient Details  Name: Brandi Acevedo MRN: [id number] Date of Birth: 1965/07/25 Referring Provider:   Flowsheet Row Cardiac Rehab from 05/30/2020 in [facility] Cardiac and Pulmonary Rehab  Referring Provider Isaias Cowman MD      Initial Encounter Date:  Flowsheet Row Cardiac Rehab from 05/30/2020 in [facility] Cardiac and Pulmonary Rehab  Date 05/30/20      Visit Diagnosis: Status post coronary artery stent placement  Patient's Home Medications on Admission:  Current Outpatient Medications:  .  aspirin 81 MG chewable tablet, Chew 1 tablet (81 mg total) by mouth daily., Disp: 30 tablet, Rfl: 6 .  citalopram (CELEXA) 40 MG tablet, Take 1 tablet by mouth once daily, Disp: 90 tablet, Rfl: 0 .  diclofenac Sodium (VOLTAREN) 1 % GEL, Apply 2 g topically daily as needed (pain). (Patient not taking: Reported on 05/17/2020), Disp: , Rfl:  .  ezetimibe (ZETIA) 10 MG tablet, Take 1 tablet (10 mg total) by mouth daily. (Patient taking differently: Take 10 mg by mouth at bedtime.), Disp: 90 tablet, Rfl: 0 .  fenofibrate 160 MG tablet, Take 1 tablet (160 mg total) by mouth daily. (Patient taking differently: Take 160 mg by mouth at bedtime.), Disp: 90 tablet, Rfl: 0 .  gabapentin (NEURONTIN) 300 MG capsule, Take 300 mg by mouth daily., Disp: , Rfl:  .  gemfibrozil (LOPID) 600 MG tablet, TAKE 1 TABLET BY MOUTH TWICE DAILY BEFORE A MEAL (Patient taking differently: Take 600 mg by mouth 2 (two) times daily before a meal. TAKE 1 TABLET BY MOUTH TWICE DAILY BEFORE A MEA), Disp: 180 tablet, Rfl: 0 .  hydrOXYzine (ATARAX/VISTARIL) 25 MG tablet, Take 1 tablet (25 mg total) by mouth 3 (three) times daily as needed. (Patient taking differently: Take 25 mg by mouth at bedtime.), Disp: 270 tablet, Rfl: 0 .  pantoprazole (PROTONIX) 40 MG tablet, Take 1 tablet (40 mg total) by mouth daily. (Patient taking differently: Take 40 mg by mouth at bedtime.), Disp: 90 tablet, Rfl: 0 .   rOPINIRole (REQUIP) 1 MG tablet, Take 1 mg by mouth at bedtime., Disp: , Rfl:  .  rOPINIRole (REQUIP) 2 MG tablet, Take 2 mg by mouth at bedtime., Disp: , Rfl:  .  ticagrelor (BRILINTA) 90 MG TABS tablet, Take 1 tablet (90 mg total) by mouth 2 (two) times daily., Disp: 60 tablet, Rfl: 6  Past Medical History: Past Medical History:  Diagnosis Date  . Anginal pain ([place])   . Anxiety   . COPD (chronic obstructive pulmonary disease) ([place])   . Deaf, left   . Depression   . Diabetes mellitus without complication ([place])   . GERD (gastroesophageal reflux disease)   . Hearing loss   . Heart attack ([place])    2007  . Hypertension   . IBS (irritable bowel syndrome)   . Pre-diabetes   . RLS (restless legs syndrome)   . RLS (restless legs syndrome)   . Seizure ([place])    x1 - attributed to use of atorvastatin  . Self-inflicted injury 09/17/1698  . Sleep apnea    no CPAP  . Suicidal ideation 05/16/2015  . Trigger finger of left thumb   . Wears dentures    full upper  . Wears hearing aid in right ear    has, does not wear    Tobacco Use: Social History   Tobacco Use  Smoking Status Current Every Day Smoker  . Packs/day: 0.50  . Years: 30.00  . Pack years: 15.00  .  Types: Cigarettes  Smokeless Tobacco Never Used    Labs: Recent Review Flowsheet Data    Labs for ITP Cardiac and Pulmonary Rehab Latest Ref Rng & Units 12/17/2015 08/28/2016 11/18/2016 10/07/2018 11/13/2019   Cholestrol 100 - 199 mg/dL 256(H) 251(H) 215(H) 211(H) 178   LDLCALC 0 - 99 mg/dL 156(H) 163(H) 118(H) 110(H) 119(H)   HDL >39 mg/dL 32(L) 35(L) 34(L) 32(L) 32(L)   Trlycerides 0 - 149 mg/dL 338(H) 267(H) 315(H) 345(H) 153(H)   Hemoglobin A1c <7.0 % 6.1(H) 5.5 - 6.3 6.6       Exercise Target Goals: Exercise Program Goal: Individual exercise prescription set using results from initial 6 min walk test and THRR while considering  patient's activity barriers and safety.   Exercise Prescription Goal: Initial exercise  prescription builds to 30-45 minutes a day of aerobic activity, 2-3 days per week.  Home exercise guidelines will be given to patient during program as part of exercise prescription that the participant will acknowledge.   Education: Aerobic Exercise: - Group verbal and visual presentation on the components of exercise prescription. Introduces F.I.T.T principle from ACSM for exercise prescriptions.  Reviews F.I.T.T. principles of aerobic exercise including progression. Written material given at graduation.   Education: Resistance Exercise: - Group verbal and visual presentation on the components of exercise prescription. Introduces F.I.T.T principle from ACSM for exercise prescriptions  Reviews F.I.T.T. principles of resistance exercise including progression. Written material given at graduation.    Education: Exercise & Equipment Safety: - Individual verbal instruction and demonstration of equipment use and safety with use of the equipment. Flowsheet Row Cardiac Rehab from 05/30/2020 in [facility] Cardiac and Pulmonary Rehab  Education need identified 05/30/20  Date 05/30/20  Educator [place]  Instruction Review Code 1- Verbalizes Understanding      Education: Exercise Physiology & General Exercise Guidelines: - Group verbal and written instruction with models to review the exercise physiology of the cardiovascular system and associated critical values. Provides general exercise guidelines with specific guidelines to those with heart or lung disease.    Education: Flexibility, Balance, Mind/Body Relaxation: - Group verbal and visual presentation with interactive activity on the components of exercise prescription. Introduces F.I.T.T principle from ACSM for exercise prescriptions. Reviews F.I.T.T. principles of flexibility and balance exercise training including progression. Also discusses the mind body connection.  Reviews various relaxation techniques to help reduce and manage stress (i.e. Deep  breathing, progressive muscle relaxation, and visualization). Balance handout provided to take home. Written material given at graduation.   Activity Barriers & Risk Stratification:  Activity Barriers & Cardiac Risk Stratification - 05/30/20 1700      Activity Barriers & Cardiac Risk Stratification   Activity Barriers Joint Problems;Balance Concerns;Shortness of Breath;Muscular Weakness;History of Falls;Other (comment)    Comments Left foot pain due to lack of healing from achilles repair    Cardiac Risk Stratification High           6 Minute Walk:  6 Minute Walk    Row Name 05/30/20 1701         6 Minute Walk   Phase Initial     Distance 600 feet     Walk Time 3.8 minutes  2 breaks; stopped at 4:45 min     # of Rest Breaks 3     MPH 1.79     METS 1.2     RPE 13     Perceived Dyspnea  3     VO2 Peak 4.21     Symptoms Yes (comment)  Comments SOB (stopped test), left foot pain 4/10     Resting HR 69 bpm     Resting BP 114/70     Resting Oxygen Saturation  98 %     Exercise Oxygen Saturation  during 6 min walk 97 %     Max Ex. HR 81 bpm     Max Ex. BP 120/68     2 Minute Post BP 116/68            Oxygen Initial Assessment:   Oxygen Re-Evaluation:   Oxygen Discharge (Final Oxygen Re-Evaluation):   Initial Exercise Prescription:  Initial Exercise Prescription - 05/30/20 1700      Date of Initial Exercise RX and Referring Provider   Date 05/30/20    Referring Provider Paraschos, [place] MD      Recumbant Bike   Level 1    RPM 60    Watts 10    Minutes 15    METs 1.2      NuStep   Level 1    SPM 80    Minutes 15    METs 1.2      Biostep-RELP   Level 1    SPM 50    Minutes 15    METs 1.2      Prescription Details   Frequency (times per week) 3    Duration Progress to 30 minutes of continuous aerobic without signs/symptoms of physical distress      Intensity   THRR 40-80% of Max Heartrate 107-146    Ratings of Perceived Exertion 11-13     Perceived Dyspnea 0-4      Progression   Progression Continue to progress workloads to maintain intensity without signs/symptoms of physical distress.      Resistance Training   Training Prescription Yes    Weight 3 lb    Reps 10-15           Perform Capillary Blood Glucose checks as needed.  Exercise Prescription Changes:  Exercise Prescription Changes    Row Name 05/30/20 1700 06/11/20 1400           Response to Exercise   Blood Pressure (Admit) 114/70 112/70      Blood Pressure (Exercise) 120/68 122/82      Blood Pressure (Exit) 116/68 122/70      Heart Rate (Admit) 69 bpm 58 bpm      Heart Rate (Exercise) 81 bpm 74 bpm      Heart Rate (Exit) 67 bpm 66 bpm      Oxygen Saturation (Admit) 98 % --      Oxygen Saturation (Exercise) 97 % --      Oxygen Saturation (Exit) 97 % --      Rating of Perceived Exertion (Exercise) 13 15      Perceived Dyspnea (Exercise) 3 --      Symptoms SOB, L. foot pain 4/10 fatigue      Comments walk test results first full day of exercise      Duration -- Progress to 30 minutes of  aerobic without signs/symptoms of physical distress      Intensity -- THRR unchanged             Progression   Progression -- Continue to progress workloads to maintain intensity without signs/symptoms of physical distress.      Average METs -- 1.8             Resistance Training   Training Prescription -- Yes  Weight -- 3 lb      Reps -- 10-15             Interval Training   Interval Training -- No             NuStep   Level -- 1      Minutes -- 15      METs -- 1.6             Biostep-RELP   Level -- 1      Minutes -- 15      METs -- 2             Exercise Comments:   Exercise Goals and Review:  Exercise Goals    Row Name 05/30/20 1708             Exercise Goals   Increase Physical Activity Yes       Intervention Provide advice, education, support and counseling about physical activity/exercise needs.;Develop an  individualized exercise prescription for aerobic and resistive training based on initial evaluation findings, risk stratification, comorbidities and participant's personal goals.       Expected Outcomes Short Term: Attend rehab on a regular basis to increase amount of physical activity.;Long Term: Add in home exercise to make exercise part of routine and to increase amount of physical activity.;Long Term: Exercising regularly at least 3-5 days a week.       Increase Strength and Stamina Yes       Intervention Provide advice, education, support and counseling about physical activity/exercise needs.;Develop an individualized exercise prescription for aerobic and resistive training based on initial evaluation findings, risk stratification, comorbidities and participant's personal goals.       Expected Outcomes Short Term: Increase workloads from initial exercise prescription for resistance, speed, and METs.;Short Term: Perform resistance training exercises routinely during rehab and add in resistance training at home;Long Term: Improve cardiorespiratory fitness, muscular endurance and strength as measured by increased METs and functional capacity (6MWT)       Able to understand and use rate of perceived exertion (RPE) scale Yes       Intervention Provide education and explanation on how to use RPE scale       Expected Outcomes Short Term: Able to use RPE daily in rehab to express subjective intensity level;Long Term:  Able to use RPE to guide intensity level when exercising independently       Able to understand and use Dyspnea scale Yes       Intervention Provide education and explanation on how to use Dyspnea scale       Expected Outcomes Short Term: Able to use Dyspnea scale daily in rehab to express subjective sense of shortness of breath during exertion;Long Term: Able to use Dyspnea scale to guide intensity level when exercising independently       Knowledge and understanding of Target Heart Rate Range  (THRR) Yes       Intervention Provide education and explanation of THRR including how the numbers were predicted and where they are located for reference       Expected Outcomes Short Term: Able to state/look up THRR;Short Term: Able to use daily as guideline for intensity in rehab;Long Term: Able to use THRR to govern intensity when exercising independently       Able to check pulse independently Yes       Intervention Provide education and demonstration on how to check pulse in carotid and radial arteries.;Review the importance of being able  to check your own pulse for safety during independent exercise       Expected Outcomes Short Term: Able to explain why pulse checking is important during independent exercise;Long Term: Able to check pulse independently and accurately       Understanding of Exercise Prescription Yes       Intervention Provide education, explanation, and written materials on patient's individual exercise prescription       Expected Outcomes Short Term: Able to explain program exercise prescription;Long Term: Able to explain home exercise prescription to exercise independently              Exercise Goals Re-Evaluation :  Exercise Goals Re-Evaluation    Row Name 06/10/20 1728             Exercise Goal Re-Evaluation   Exercise Goals Review Increase Physical Activity;Able to understand and use rate of perceived exertion (RPE) scale;Knowledge and understanding of Target Heart Rate Range (THRR);Understanding of Exercise Prescription;Increase Strength and Stamina;Able to check pulse independently       Comments Reviewed RPE and dyspnea scales, THR and program prescription with pt today.  Pt voiced understanding and was given a copy of goals to take home.       Expected Outcomes Short: Use RPE daily to regulate intensity. Long: Follow program prescription in THR.              Discharge Exercise Prescription (Final Exercise Prescription Changes):  Exercise Prescription  Changes - 06/11/20 1400      Response to Exercise   Blood Pressure (Admit) 112/70    Blood Pressure (Exercise) 122/82    Blood Pressure (Exit) 122/70    Heart Rate (Admit) 58 bpm    Heart Rate (Exercise) 74 bpm    Heart Rate (Exit) 66 bpm    Rating of Perceived Exertion (Exercise) 15    Symptoms fatigue    Comments first full day of exercise    Duration Progress to 30 minutes of  aerobic without signs/symptoms of physical distress    Intensity THRR unchanged      Progression   Progression Continue to progress workloads to maintain intensity without signs/symptoms of physical distress.    Average METs 1.8      Resistance Training   Training Prescription Yes    Weight 3 lb    Reps 10-15      Interval Training   Interval Training No      NuStep   Level 1    Minutes 15    METs 1.6      Biostep-RELP   Level 1    Minutes 15    METs 2           Nutrition:  Target Goals: Understanding of nutrition guidelines, daily intake of sodium 1500mg , cholesterol 200mg , calories 30% from fat and 7% or less from saturated fats, daily to have 5 or more servings of fruits and vegetables.  Education: All About Nutrition: -Group instruction provided by verbal, written material, interactive activities, discussions, models, and posters to present general guidelines for heart healthy nutrition including fat, fiber, MyPlate, the role of sodium in heart healthy nutrition, utilization of the nutrition label, and utilization of this knowledge for meal planning. Follow up email sent as well. Written material given at graduation.   Biometrics:  Pre Biometrics - 05/30/20 1700      Pre Biometrics   Height 5' 0.5" (1.537 m)    Weight 177 lb 9.6 oz (80.6 kg)    BMI (  Calculated) 34.1    Single Leg Stand 13.4 seconds            Nutrition Therapy Plan and Nutrition Goals:   Nutrition Assessments:  MEDIFICTS Score Key:  ?70 Need to make dietary changes   40-70 Heart Healthy Diet  ?  40 Therapeutic Level Cholesterol Diet  Flowsheet Row Cardiac Rehab from 05/30/2020 in [facility] Cardiac and Pulmonary Rehab  Picture Your Plate Total Score on Admission 59     Picture Your Plate Scores:  <29 Unhealthy dietary pattern with much room for improvement.  41-50 Dietary pattern unlikely to meet recommendations for good health and room for improvement.  51-60 More healthful dietary pattern, with some room for improvement.   >60 Healthy dietary pattern, although there may be some specific behaviors that could be improved.    Nutrition Goals Re-Evaluation:   Nutrition Goals Discharge (Final Nutrition Goals Re-Evaluation):   Psychosocial: Target Goals: Acknowledge presence or absence of significant depression and/or stress, maximize coping skills, provide positive support system. Participant is able to verbalize types and ability to use techniques and skills needed for reducing stress and depression.   Education: Stress, Anxiety, and Depression - Group verbal and visual presentation to define topics covered.  Reviews how body is impacted by stress, anxiety, and depression.  Also discusses healthy ways to reduce stress and to treat/manage anxiety and depression.  Written material given at graduation.   Education: Sleep Hygiene -Provides group verbal and written instruction about how sleep can affect your health.  Define sleep hygiene, discuss sleep cycles and impact of sleep habits. Review good sleep hygiene tips.    Initial Review & Psychosocial Screening:  Initial Psych Review & Screening - 05/17/20 1514      Initial Review   Current issues with Current Stress Concerns;History of Depression;Current Anxiety/Panic    Source of Stress Concerns Unable to participate in former interests or hobbies;Unable to perform yard/household activities      [place]? Yes      Barriers   Psychosocial barriers to participate in program There are no identifiable  barriers or psychosocial needs.;The patient should benefit from training in stress management and relaxation.      Screening Interventions   Interventions Provide feedback about the scores to participant    Expected Outcomes Short Term goal: Utilizing psychosocial counselor, staff and physician to assist with identification of specific Stressors or current issues interfering with healing process. Setting desired goal for each stressor or current issue identified.;Long Term Goal: Stressors or current issues are controlled or eliminated.;Short Term goal: Identification and review with participant of any Quality of Life or Depression concerns found by scoring the questionnaire.;Long Term goal: The participant improves quality of Life and PHQ9 Scores as seen by post scores and/or verbalization of changes           Quality of Life Scores:   Quality of Life - 05/30/20 1658      Quality of Life   Select Quality of Life      Quality of Life Scores   Health/Function Pre 17.27 %    Socioeconomic Pre 28.07 %    Psych/Spiritual Pre 28.29 %    Family Pre 25.2 %    GLOBAL Pre 22.93 %          Scores of 19 and below usually indicate a poorer quality of life in these areas.  A difference of  2-3 points is a clinically meaningful difference.  A difference  of 2-3 points in the total score of the Quality of Life Index has been associated with significant improvement in overall quality of life, self-image, physical symptoms, and general health in studies assessing change in quality of life.  PHQ-9: Recent Review Flowsheet Data    Depression screen [facility] 2/9 05/30/2020 11/13/2019 02/17/2019 07/05/2017 12/18/2016   Decreased Interest 1 1 1 1 1    Down, Depressed, Hopeless 0 0 1 1 1    PHQ - 2 Score 1 1 2 2 2    Altered sleeping 3  3 1 3 2    Tired, decreased energy 3  1 1 2 2    Change in appetite 1 1 1 1  0   Feeling bad or failure about yourself  1 0 1 0 1   Trouble concentrating 0 0 1 0 1   Moving slowly or  fidgety/restless 0 0 0 0 1   Suicidal thoughts 0 0 0 0 0   PHQ-9 Score 9 6 7 8 9    Difficult doing work/chores Somewhat difficult - Not difficult at all Somewhat difficult Somewhat difficult     Interpretation of Total Score  Total Score Depression Severity:  1-4 = Minimal depression, 5-9 = Mild depression, 10-14 = Moderate depression, 15-19 = Moderately severe depression, 20-27 = Severe depression   Psychosocial Evaluation and Intervention:  Psychosocial Evaluation - 05/17/20 1525      Psychosocial Evaluation & Interventions   Interventions Encouraged to exercise with the program and follow exercise prescription;Stress management education;Relaxation education    Comments Arrie Aran reports doing "okay" after her stents in October. She has had some insurance approval issues that she is hoping are almost resolved so she can finally come to cardiac rehab. She is also trying to get approved for disability. She has had an MI in 2007 and did cardiac rehab then. Since then, she has had both achilles worked on and the left foot did not heal properly so she is waiting on cardiology to approve her to get the surgery redone. This pain limits her mobility and has caused some balance issues. She really wants to work on stamina and strength. She also may start thinking about quitting smoking but is hesitant because about 5 years ago when she tried to quit using medication it caused issues with her depression and she admitted herself to behavioral health for a night. Her mental health is going well and she works on staying focused to better her health. Her depression medication is working well. Her sleep is disturbed by her restless leg syndrome, but if she takes her medication she feels like she slept well.    Expected Outcomes Short: attend cardiac rehab for education and exercise. Long: develop and maintain positive self care habits.    Continue Psychosocial Services  Follow up required by staff            Psychosocial Re-Evaluation:   Psychosocial Discharge (Final Psychosocial Re-Evaluation):   Vocational Rehabilitation: Provide vocational rehab assistance to qualifying candidates.   Vocational Rehab Evaluation & Intervention:  Vocational Rehab - 05/17/20 1525      Initial Vocational Rehab Evaluation & Intervention   Assessment shows need for Vocational Rehabilitation No           Education: Education Goals: Education classes will be provided on a variety of topics geared toward better understanding of heart health and risk factor modification. Participant will state understanding/return demonstration of topics presented as noted by education test scores.  Learning Barriers/Preferences:  Learning Barriers/Preferences - 05/17/20  1510      Learning Barriers/Preferences   Learning Barriers None    Learning Preferences None           General Cardiac Education Topics:  AED/CPR: - Group verbal and written instruction with the use of models to demonstrate the basic use of the AED with the basic ABC's of resuscitation.   Anatomy and Cardiac Procedures: - Group verbal and visual presentation and models provide information about basic cardiac anatomy and function. Reviews the testing methods done to diagnose heart disease and the outcomes of the test results. Describes the treatment choices: Medical Management, Angioplasty, or Coronary Bypass Surgery for treating various heart conditions including Myocardial Infarction, Angina, Valve Disease, and Cardiac Arrhythmias.  Written material given at graduation.   Medication Safety: - Group verbal and visual instruction to review commonly prescribed medications for heart and lung disease. Reviews the medication, class of the drug, and side effects. Includes the steps to properly store meds and maintain the prescription regimen.  Written material given at graduation.   Intimacy: - Group verbal instruction through game format to  discuss how heart and lung disease can affect sexual intimacy. Written material given at graduation..   Know Your Numbers and Heart Failure: - Group verbal and visual instruction to discuss disease risk factors for cardiac and pulmonary disease and treatment options.  Reviews associated critical values for Overweight/Obesity, Hypertension, Cholesterol, and Diabetes.  Discusses basics of heart failure: signs/symptoms and treatments.  Introduces Heart Failure Zone chart for action plan for heart failure.  Written material given at graduation.   Infection Prevention: - Provides verbal and written material to individual with discussion of infection control including proper hand washing and proper equipment cleaning during exercise session. Flowsheet Row Cardiac Rehab from 05/30/2020 in [facility] Cardiac and Pulmonary [facility]  Education need identified 05/30/20  Date 05/30/20  Educator [place]  Instruction Review Code 1- Verbalizes Understanding      Falls Prevention: - Provides verbal and written material to individual with discussion of falls prevention and safety. Flowsheet [facility] from 05/30/2020 in [facility]  Education need identified 05/30/20  Date 05/30/20  Educator [place]  Instruction Review Code 1- Verbalizes Understanding      Other: -Provides group and verbal instruction on various topics (see comments)   Knowledge Questionnaire Score:  Knowledge Questionnaire Score - 05/30/20 1655      Knowledge Questionnaire Score   Pre Score 23/26: Angina, Exercise, Nutrition           Core Components/Risk Factors/Patient Goals at Admission:  Personal Goals and Risk Factors at Admission - 05/30/20 1709      Core Components/Risk Factors/Patient Goals on Admission    Weight Management Yes;Weight Loss    Intervention Weight Management: Develop a combined nutrition and exercise program designed to reach desired caloric intake, while maintaining appropriate intake of  nutrient and fiber, sodium and fats, and appropriate energy expenditure required for the weight goal.;Weight Management: Provide education and appropriate resources to help participant work on and attain dietary goals.;Weight Management/Obesity: Establish reasonable short term and long term weight goals.    Admit Weight 177 lb (80.3 kg)    Goal Weight: Short Term 172 lb (78 kg)    Goal Weight: Long Term 167 lb (75.8 kg)    Expected Outcomes Short Term: Continue to assess and modify interventions until short term weight is achieved;Long Term: Adherence to nutrition and physical activity/exercise program aimed toward attainment of established weight goal;Weight Loss: Understanding  of general recommendations for a balanced deficit meal plan, which promotes 1-2 lb weight loss per week and includes a negative energy balance of [phone] kcal/d;Understanding recommendations for meals to include 15-35% energy as protein, 25-35% energy from fat, 35-60% energy from carbohydrates, less than 200mg  of dietary cholesterol, 20-35 gm of total fiber daily;Understanding of distribution of calorie intake throughout the day with the consumption of 4-5 meals/snacks    Tobacco Cessation Yes    Number of packs per day ~10 cigs/ day    Intervention Assist the participant in steps to quit. Provide individualized education and counseling about committing to Tobacco Cessation, relapse prevention, and pharmacological support that can be provided by physician.;Advice worker, assist with locating and accessing local/national Quit Smoking programs, and support quit date choice.    Expected Outcomes Short Term: Will demonstrate readiness to quit, by selecting a quit date.;Short Term: Will quit all tobacco product use, adhering to prevention of relapse plan.;Long Term: Complete abstinence from all tobacco products for at least 12 months from quit date.    Diabetes Yes    Intervention Provide education about signs/symptoms  and action to take for hypo/hyperglycemia.;Provide education about proper nutrition, including hydration, and aerobic/resistive exercise prescription along with prescribed medications to achieve blood glucose in normal ranges: Fasting glucose 65-99 mg/dL    Expected Outcomes Short Term: Participant verbalizes understanding of the signs/symptoms and immediate care of hyper/hypoglycemia, proper foot care and importance of medication, aerobic/resistive exercise and nutrition plan for blood glucose control.;Long Term: Attainment of HbA1C < 7%.    Hypertension Yes    Intervention Provide education on lifestyle modifcations including regular physical activity/exercise, weight management, moderate sodium restriction and increased consumption of fresh fruit, vegetables, and low fat dairy, alcohol moderation, and smoking cessation.;Monitor prescription use compliance.    Expected Outcomes Short Term: Continued assessment and intervention until BP is < 140/40mm HG in hypertensive participants. < 130/70mm HG in hypertensive participants with diabetes, heart failure or chronic kidney disease.;Long Term: Maintenance of blood pressure at goal levels.    Lipids Yes    Intervention Provide education and support for participant on nutrition & aerobic/resistive exercise along with prescribed medications to achieve LDL 70mg , HDL >40mg .    Expected Outcomes Short Term: Participant states understanding of desired cholesterol values and is compliant with medications prescribed. Participant is following exercise prescription and nutrition guidelines.;Long Term: Cholesterol controlled with medications as prescribed, with individualized exercise RX and with personalized nutrition plan. Value goals: LDL < 70mg , HDL > 40 mg.           Education:Diabetes - Individual verbal and written instruction to review signs/symptoms of diabetes, desired ranges of glucose level fasting, after meals and with exercise. Acknowledge that pre  and post exercise glucose checks will be done for 3 sessions at entry of program. [place] from 05/17/2020 in [facility] Card[facility] Pul[facility] Re[facility]  Date 05/17/20  Educator [facility]  Instruction Review Code 1- Verbalizes Understanding      Core Components/Risk Factors/Patient Goals Review:    Core Components/Risk Factors/Patient Goals at Discharge (Final Review):    ITP Comments:  ITP Comments    [place] Name 05/17/20 1532 05/30/20 1643 05/30/20 1714 06/05/20 0739 06/10/20 1728   ITP Comments Initial telephone orientation completed. Diagnosis can [facility] found in [facility] 1/[facility]. EP orientation scheduled for Thursday 3/17 at 3pm Completed 6MWT and gym orientation. Initial ITP created and sent for review to Dr. Emily Filbert, Medical Director. [name] is a current tobacco user. Intervention  for tobacco cessation was provided at the initial medical review. She was asked about readiness to quit and reported she is ready to quit but has no start date yet . Patient reports she has nicotine patches and stopped using them, but plans to try to use them again. Patient was advised and educated about tobacco cessation using combination therapy, tobacco cessation classes, quit line, and quit smoking apps. Patient demonstrated understanding of this material. Staff will continue to provide encouragement and follow up with the patient throughout the program. 30 Day review completed. Medical Director ITP review done, changes made as directed, and signed approval by Medical Director. First full day of exercise!  Patient was oriented to gym and equipment including functions, settings, policies, and procedures.  Patient's individual exercise prescription and treatment plan were reviewed.  All starting workloads were established based on the results of the 6 minute walk test done at initial orientation visit.  The plan for exercise progression was also introduced and progression will be customized based on patient's performance and  goals.   [place] Name 07/02/20 [phone] 07/03/20 0955         ITP Comments Patient has not returned since first day of exercise. She is out right now due to husbdan with COVID as of 4/13 - may start PT. 30 Day review completed. Medical Director ITP review done, changes made as directed, and signed approval by Medical Director.  Out for medical reasons             Comments:

## 2020-07-08 ENCOUNTER — Encounter: Payer: Self-pay | Admitting: *Deleted

## 2020-07-08 DIAGNOSIS — Z955 Presence of coronary angioplasty implant and graft: Secondary | ICD-10-CM

## 2020-07-08 NOTE — Progress Notes (Signed)
Cardiac Individual Treatment Plan  Patient Details  Name: MANJU KULKARNI MRN: [id number] Date of Birth: 1965/07/25 Referring Provider:   Flowsheet Row Cardiac Rehab from 05/30/2020 in [facility] Cardiac and Pulmonary Rehab  Referring Provider Isaias Cowman MD      Initial Encounter Date:  Flowsheet Row Cardiac Rehab from 05/30/2020 in [facility] Cardiac and Pulmonary Rehab  Date 05/30/20      Visit Diagnosis: Status post coronary artery stent placement  Patient's Home Medications on Admission:  Current Outpatient Medications:  .  aspirin 81 MG chewable tablet, Chew 1 tablet (81 mg total) by mouth daily., Disp: 30 tablet, Rfl: 6 .  citalopram (CELEXA) 40 MG tablet, Take 1 tablet by mouth once daily, Disp: 90 tablet, Rfl: 0 .  diclofenac Sodium (VOLTAREN) 1 % GEL, Apply 2 g topically daily as needed (pain). (Patient not taking: Reported on 05/17/2020), Disp: , Rfl:  .  ezetimibe (ZETIA) 10 MG tablet, Take 1 tablet (10 mg total) by mouth daily. (Patient taking differently: Take 10 mg by mouth at bedtime.), Disp: 90 tablet, Rfl: 0 .  fenofibrate 160 MG tablet, Take 1 tablet (160 mg total) by mouth daily. (Patient taking differently: Take 160 mg by mouth at bedtime.), Disp: 90 tablet, Rfl: 0 .  gabapentin (NEURONTIN) 300 MG capsule, Take 300 mg by mouth daily., Disp: , Rfl:  .  gemfibrozil (LOPID) 600 MG tablet, TAKE 1 TABLET BY MOUTH TWICE DAILY BEFORE A MEAL (Patient taking differently: Take 600 mg by mouth 2 (two) times daily before a meal. TAKE 1 TABLET BY MOUTH TWICE DAILY BEFORE A MEA), Disp: 180 tablet, Rfl: 0 .  hydrOXYzine (ATARAX/VISTARIL) 25 MG tablet, Take 1 tablet (25 mg total) by mouth 3 (three) times daily as needed. (Patient taking differently: Take 25 mg by mouth at bedtime.), Disp: 270 tablet, Rfl: 0 .  pantoprazole (PROTONIX) 40 MG tablet, Take 1 tablet (40 mg total) by mouth daily. (Patient taking differently: Take 40 mg by mouth at bedtime.), Disp: 90 tablet, Rfl: 0 .   rOPINIRole (REQUIP) 1 MG tablet, Take 1 mg by mouth at bedtime., Disp: , Rfl:  .  rOPINIRole (REQUIP) 2 MG tablet, Take 2 mg by mouth at bedtime., Disp: , Rfl:  .  ticagrelor (BRILINTA) 90 MG TABS tablet, Take 1 tablet (90 mg total) by mouth 2 (two) times daily., Disp: 60 tablet, Rfl: 6  Past Medical History: Past Medical History:  Diagnosis Date  . Anginal pain ([place])   . Anxiety   . COPD (chronic obstructive pulmonary disease) ([place])   . Deaf, left   . Depression   . Diabetes mellitus without complication ([place])   . GERD (gastroesophageal reflux disease)   . Hearing loss   . Heart attack ([place])    2007  . Hypertension   . IBS (irritable bowel syndrome)   . Pre-diabetes   . RLS (restless legs syndrome)   . RLS (restless legs syndrome)   . Seizure ([place])    x1 - attributed to use of atorvastatin  . Self-inflicted injury 09/17/1698  . Sleep apnea    no CPAP  . Suicidal ideation 05/16/2015  . Trigger finger of left thumb   . Wears dentures    full upper  . Wears hearing aid in right ear    has, does not wear    Tobacco Use: Social History   Tobacco Use  Smoking Status Current Every Day Smoker  . Packs/day: 0.50  . Years: 30.00  . Pack years: 15.00  .  Types: Cigarettes  Smokeless Tobacco Never Used    Labs: Recent Review Flowsheet Data    Labs for ITP Cardiac and Pulmonary Rehab Latest Ref Rng & Units 12/17/2015 08/28/2016 11/18/2016 10/07/2018 11/13/2019   Cholestrol 100 - 199 mg/dL 256(H) 251(H) 215(H) 211(H) 178   LDLCALC 0 - 99 mg/dL 156(H) 163(H) 118(H) 110(H) 119(H)   HDL >39 mg/dL 32(L) 35(L) 34(L) 32(L) 32(L)   Trlycerides 0 - 149 mg/dL 338(H) 267(H) 315(H) 345(H) 153(H)   Hemoglobin A1c <7.0 % 6.1(H) 5.5 - 6.3 6.6       Exercise Target Goals: Exercise Program Goal: Individual exercise prescription set using results from initial 6 min walk test and THRR while considering  patient's activity barriers and safety.   Exercise Prescription Goal: Initial exercise  prescription builds to 30-45 minutes a day of aerobic activity, 2-3 days per week.  Home exercise guidelines will be given to patient during program as part of exercise prescription that the participant will acknowledge.   Education: Aerobic Exercise: - Group verbal and visual presentation on the components of exercise prescription. Introduces F.I.T.T principle from ACSM for exercise prescriptions.  Reviews F.I.T.T. principles of aerobic exercise including progression. Written material given at graduation.   Education: Resistance Exercise: - Group verbal and visual presentation on the components of exercise prescription. Introduces F.I.T.T principle from ACSM for exercise prescriptions  Reviews F.I.T.T. principles of resistance exercise including progression. Written material given at graduation.    Education: Exercise & Equipment Safety: - Individual verbal instruction and demonstration of equipment use and safety with use of the equipment. Flowsheet Row Cardiac Rehab from 05/30/2020 in [facility] Cardiac and Pulmonary Rehab  Education need identified 05/30/20  Date 05/30/20  Educator [place]  Instruction Review Code 1- Verbalizes Understanding      Education: Exercise Physiology & General Exercise Guidelines: - Group verbal and written instruction with models to review the exercise physiology of the cardiovascular system and associated critical values. Provides general exercise guidelines with specific guidelines to those with heart or lung disease.    Education: Flexibility, Balance, Mind/Body Relaxation: - Group verbal and visual presentation with interactive activity on the components of exercise prescription. Introduces F.I.T.T principle from ACSM for exercise prescriptions. Reviews F.I.T.T. principles of flexibility and balance exercise training including progression. Also discusses the mind body connection.  Reviews various relaxation techniques to help reduce and manage stress (i.e. Deep  breathing, progressive muscle relaxation, and visualization). Balance handout provided to take home. Written material given at graduation.   Activity Barriers & Risk Stratification:  Activity Barriers & Cardiac Risk Stratification - 05/30/20 1700      Activity Barriers & Cardiac Risk Stratification   Activity Barriers Joint Problems;Balance Concerns;Shortness of Breath;Muscular Weakness;History of Falls;Other (comment)    Comments Left foot pain due to lack of healing from achilles repair    Cardiac Risk Stratification High           6 Minute Walk:  6 Minute Walk    Row Name 05/30/20 1701         6 Minute Walk   Phase Initial     Distance 600 feet     Walk Time 3.8 minutes  2 breaks; stopped at 4:45 min     # of Rest Breaks 3     MPH 1.79     METS 1.2     RPE 13     Perceived Dyspnea  3     VO2 Peak 4.21     Symptoms Yes (comment)  Comments SOB (stopped test), left foot pain 4/10     Resting HR 69 bpm     Resting BP 114/70     Resting Oxygen Saturation  98 %     Exercise Oxygen Saturation  during 6 min walk 97 %     Max Ex. HR 81 bpm     Max Ex. BP 120/68     2 Minute Post BP 116/68            Oxygen Initial Assessment:   Oxygen Re-Evaluation:   Oxygen Discharge (Final Oxygen Re-Evaluation):   Initial Exercise Prescription:  Initial Exercise Prescription - 05/30/20 1700      Date of Initial Exercise RX and Referring Provider   Date 05/30/20    Referring Provider Paraschos, [place] MD      Recumbant Bike   Level 1    RPM 60    Watts 10    Minutes 15    METs 1.2      NuStep   Level 1    SPM 80    Minutes 15    METs 1.2      Biostep-RELP   Level 1    SPM 50    Minutes 15    METs 1.2      Prescription Details   Frequency (times per week) 3    Duration Progress to 30 minutes of continuous aerobic without signs/symptoms of physical distress      Intensity   THRR 40-80% of Max Heartrate 107-146    Ratings of Perceived Exertion 11-13     Perceived Dyspnea 0-4      Progression   Progression Continue to progress workloads to maintain intensity without signs/symptoms of physical distress.      Resistance Training   Training Prescription Yes    Weight 3 lb    Reps 10-15           Perform Capillary Blood Glucose checks as needed.  Exercise Prescription Changes:  Exercise Prescription Changes    Row Name 05/30/20 1700 06/11/20 1400           Response to Exercise   Blood Pressure (Admit) 114/70 112/70      Blood Pressure (Exercise) 120/68 122/82      Blood Pressure (Exit) 116/68 122/70      Heart Rate (Admit) 69 bpm 58 bpm      Heart Rate (Exercise) 81 bpm 74 bpm      Heart Rate (Exit) 67 bpm 66 bpm      Oxygen Saturation (Admit) 98 % --      Oxygen Saturation (Exercise) 97 % --      Oxygen Saturation (Exit) 97 % --      Rating of Perceived Exertion (Exercise) 13 15      Perceived Dyspnea (Exercise) 3 --      Symptoms SOB, L. foot pain 4/10 fatigue      Comments walk test results first full day of exercise      Duration -- Progress to 30 minutes of  aerobic without signs/symptoms of physical distress      Intensity -- THRR unchanged             Progression   Progression -- Continue to progress workloads to maintain intensity without signs/symptoms of physical distress.      Average METs -- 1.8             Resistance Training   Training Prescription -- Yes  Weight -- 3 lb      Reps -- 10-15             Interval Training   Interval Training -- No             NuStep   Level -- 1      Minutes -- 15      METs -- 1.6             Biostep-RELP   Level -- 1      Minutes -- 15      METs -- 2             Exercise Comments:   Exercise Goals and Review:  Exercise Goals    Row Name 05/30/20 1708             Exercise Goals   Increase Physical Activity Yes       Intervention Provide advice, education, support and counseling about physical activity/exercise needs.;Develop an  individualized exercise prescription for aerobic and resistive training based on initial evaluation findings, risk stratification, comorbidities and participant's personal goals.       Expected Outcomes Short Term: Attend rehab on a regular basis to increase amount of physical activity.;Long Term: Add in home exercise to make exercise part of routine and to increase amount of physical activity.;Long Term: Exercising regularly at least 3-5 days a week.       Increase Strength and Stamina Yes       Intervention Provide advice, education, support and counseling about physical activity/exercise needs.;Develop an individualized exercise prescription for aerobic and resistive training based on initial evaluation findings, risk stratification, comorbidities and participant's personal goals.       Expected Outcomes Short Term: Increase workloads from initial exercise prescription for resistance, speed, and METs.;Short Term: Perform resistance training exercises routinely during rehab and add in resistance training at home;Long Term: Improve cardiorespiratory fitness, muscular endurance and strength as measured by increased METs and functional capacity (6MWT)       Able to understand and use rate of perceived exertion (RPE) scale Yes       Intervention Provide education and explanation on how to use RPE scale       Expected Outcomes Short Term: Able to use RPE daily in rehab to express subjective intensity level;Long Term:  Able to use RPE to guide intensity level when exercising independently       Able to understand and use Dyspnea scale Yes       Intervention Provide education and explanation on how to use Dyspnea scale       Expected Outcomes Short Term: Able to use Dyspnea scale daily in rehab to express subjective sense of shortness of breath during exertion;Long Term: Able to use Dyspnea scale to guide intensity level when exercising independently       Knowledge and understanding of Target Heart Rate Range  (THRR) Yes       Intervention Provide education and explanation of THRR including how the numbers were predicted and where they are located for reference       Expected Outcomes Short Term: Able to state/look up THRR;Short Term: Able to use daily as guideline for intensity in rehab;Long Term: Able to use THRR to govern intensity when exercising independently       Able to check pulse independently Yes       Intervention Provide education and demonstration on how to check pulse in carotid and radial arteries.;Review the importance of being able  to check your own pulse for safety during independent exercise       Expected Outcomes Short Term: Able to explain why pulse checking is important during independent exercise;Long Term: Able to check pulse independently and accurately       Understanding of Exercise Prescription Yes       Intervention Provide education, explanation, and written materials on patient's individual exercise prescription       Expected Outcomes Short Term: Able to explain program exercise prescription;Long Term: Able to explain home exercise prescription to exercise independently              Exercise Goals Re-Evaluation :  Exercise Goals Re-Evaluation    Row Name 06/10/20 1728 07/08/20 1622           Exercise Goal Re-Evaluation   Exercise Goals Review Increase Physical Activity;Able to understand and use rate of perceived exertion (RPE) scale;Knowledge and understanding of Target Heart Rate Range (THRR);Understanding of Exercise Prescription;Increase Strength and Stamina;Able to check pulse independently --      Comments Reviewed RPE and dyspnea scales, THR and program prescription with pt today.  Pt voiced understanding and was given a copy of goals to take home. Continues to be out since last review.      Expected Outcomes Short: Use RPE daily to regulate intensity. Long: Follow program prescription in [facility]. --             Discharge Exercise Prescription (Final  Exercise Prescription Changes):  Exercise Prescription Changes - 06/11/20 1400      Response to Exercise   Blood Pressure (Admit) 112/70    Blood Pressure (Exercise) 122/82    Blood Pressure (Exit) 122/70    Heart Rate (Admit) 58 bpm    Heart Rate (Exercise) 74 bpm    Heart Rate (Exit) 66 bpm    Rating of Perceived Exertion (Exercise) 15    Symptoms fatigue    Comments first full day of exercise    Duration Progress to 30 minutes of  aerobic without signs/symptoms of physical distress    Intensity THRR unchanged      Progression   Progression Continue to progress workloads to maintain intensity without signs/symptoms of physical distress.    Average METs 1.8      Resistance Training   Training Prescription Yes    Weight 3 lb    Reps 10-15      Interval Training   Interval Training No      NuStep   Level 1    Minutes 15    METs 1.6      Biostep-RELP   Level 1    Minutes 15    METs 2           Nutrition:  Target Goals: Understanding of nutrition guidelines, daily intake of sodium 1500mg , cholesterol 200mg , calories 30% from fat and 7% or less from saturated fats, daily to have 5 or more servings of fruits and vegetables.  Education: All About Nutrition: -Group instruction provided by verbal, written material, interactive activities, discussions, models, and posters to present general guidelines for heart healthy nutrition including fat, fiber, MyPlate, the role of sodium in heart healthy nutrition, utilization of the nutrition label, and utilization of this knowledge for meal planning. Follow up email sent as well. Written material given at graduation.   Biometrics:  Pre Biometrics - 05/30/20 1700      Pre Biometrics   Height 5' 0.5" (1.537 m)    Weight 177 lb 9.6 oz (  80.6 kg)    BMI (Calculated) 34.1    Single Leg Stand 13.4 seconds            Nutrition Therapy Plan and Nutrition Goals:   Nutrition Assessments:  MEDIFICTS Score Key:  ?70 Need to  make dietary changes   40-70 Heart Healthy Diet  ? 40 Therapeutic Level Cholesterol Diet  Flowsheet Row Cardiac Rehab from 05/30/2020 in [facility] Cardiac and Pulmonary Rehab  Picture Your Plate Total Score on Admission 59     Picture Your Plate Scores:  <81 Unhealthy dietary pattern with much room for improvement.  41-50 Dietary pattern unlikely to meet recommendations for good health and room for improvement.  51-60 More healthful dietary pattern, with some room for improvement.   >60 Healthy dietary pattern, although there may be some specific behaviors that could be improved.    Nutrition Goals Re-Evaluation:   Nutrition Goals Discharge (Final Nutrition Goals Re-Evaluation):   Psychosocial: Target Goals: Acknowledge presence or absence of significant depression and/or stress, maximize coping skills, provide positive support system. Participant is able to verbalize types and ability to use techniques and skills needed for reducing stress and depression.   Education: Stress, Anxiety, and Depression - Group verbal and visual presentation to define topics covered.  Reviews how body is impacted by stress, anxiety, and depression.  Also discusses healthy ways to reduce stress and to treat/manage anxiety and depression.  Written material given at graduation.   Education: Sleep Hygiene -Provides group verbal and written instruction about how sleep can affect your health.  Define sleep hygiene, discuss sleep cycles and impact of sleep habits. Review good sleep hygiene tips.    Initial Review & Psychosocial Screening:  Initial Psych Review & Screening - 05/17/20 1514      Initial Review   Current issues with Current Stress Concerns;History of Depression;Current Anxiety/Panic    Source of Stress Concerns Unable to participate in former interests or hobbies;Unable to perform yard/household activities      [place]? Yes      Barriers   Psychosocial  barriers to participate in program There are no identifiable barriers or psychosocial needs.;The patient should benefit from training in stress management and relaxation.      Screening Interventions   Interventions Provide feedback about the scores to participant    Expected Outcomes Short Term goal: Utilizing psychosocial counselor, staff and physician to assist with identification of specific Stressors or current issues interfering with healing process. Setting desired goal for each stressor or current issue identified.;Long Term Goal: Stressors or current issues are controlled or eliminated.;Short Term goal: Identification and review with participant of any Quality of Life or Depression concerns found by scoring the questionnaire.;Long Term goal: The participant improves quality of Life and PHQ9 Scores as seen by post scores and/or verbalization of changes           Quality of Life Scores:   Quality of Life - 05/30/20 1658      Quality of Life   Select Quality of Life      Quality of Life Scores   Health/Function Pre 17.27 %    Socioeconomic Pre 28.07 %    Psych/Spiritual Pre 28.29 %    Family Pre 25.2 %    GLOBAL Pre 22.93 %          Scores of 19 and below usually indicate a poorer quality of life in these areas.  A difference of  2-3 points is a  clinically meaningful difference.  A difference of 2-3 points in the total score of the Quality of Life Index has been associated with significant improvement in overall quality of life, self-image, physical symptoms, and general health in studies assessing change in quality of life.  PHQ-9: Recent Review Flowsheet Data    Depression screen [facility] 2/9 05/30/2020 11/13/2019 02/17/2019 07/05/2017 12/18/2016   Decreased Interest 1 1 1 1 1    Down, Depressed, Hopeless 0 0 1 1 1    PHQ - 2 Score 1 1 2 2 2    Altered sleeping 3  3 1 3 2    Tired, decreased energy 3  1 1 2 2    Change in appetite 1 1 1 1  0   Feeling bad or failure about yourself  1 0 1  0 1   Trouble concentrating 0 0 1 0 1   Moving slowly or fidgety/restless 0 0 0 0 1   Suicidal thoughts 0 0 0 0 0   PHQ-9 Score 9 6 7 8 9    Difficult doing work/chores Somewhat difficult - Not difficult at all Somewhat difficult Somewhat difficult     Interpretation of Total Score  Total Score Depression Severity:  1-4 = Minimal depression, 5-9 = Mild depression, 10-14 = Moderate depression, 15-19 = Moderately severe depression, 20-27 = Severe depression   Psychosocial Evaluation and Intervention:  Psychosocial Evaluation - 05/17/20 1525      Psychosocial Evaluation & Interventions   Interventions Encouraged to exercise with the program and follow exercise prescription;Stress management education;Relaxation education    Comments Arrie Aran reports doing "okay" after her stents in October. She has had some insurance approval issues that she is hoping are almost resolved so she can finally come to cardiac rehab. She is also trying to get approved for disability. She has had an MI in 2007 and did cardiac rehab then. Since then, she has had both achilles worked on and the left foot did not heal properly so she is waiting on cardiology to approve her to get the surgery redone. This pain limits her mobility and has caused some balance issues. She really wants to work on stamina and strength. She also may start thinking about quitting smoking but is hesitant because about 5 years ago when she tried to quit using medication it caused issues with her depression and she admitted herself to behavioral health for a night. Her mental health is going well and she works on staying focused to better her health. Her depression medication is working well. Her sleep is disturbed by her restless leg syndrome, but if she takes her medication she feels like she slept well.    Expected Outcomes Short: attend cardiac rehab for education and exercise. Long: develop and maintain positive self care habits.    Continue  Psychosocial Services  Follow up required by staff           Psychosocial Re-Evaluation:   Psychosocial Discharge (Final Psychosocial Re-Evaluation):   Vocational Rehabilitation: Provide vocational rehab assistance to qualifying candidates.   Vocational Rehab Evaluation & Intervention:  Vocational Rehab - 05/17/20 1525      Initial Vocational Rehab Evaluation & Intervention   Assessment shows need for Vocational Rehabilitation No           Education: Education Goals: Education classes will be provided on a variety of topics geared toward better understanding of heart health and risk factor modification. Participant will state understanding/return demonstration of topics presented as noted by education test scores.  Learning  Barriers/Preferences:  Learning Barriers/Preferences - 05/17/20 1510      Learning Barriers/Preferences   Learning Barriers None    Learning Preferences None           General Cardiac Education Topics:  AED/CPR: - Group verbal and written instruction with the use of models to demonstrate the basic use of the AED with the basic ABC's of resuscitation.   Anatomy and Cardiac Procedures: - Group verbal and visual presentation and models provide information about basic cardiac anatomy and function. Reviews the testing methods done to diagnose heart disease and the outcomes of the test results. Describes the treatment choices: Medical Management, Angioplasty, or Coronary Bypass Surgery for treating various heart conditions including Myocardial Infarction, Angina, Valve Disease, and Cardiac Arrhythmias.  Written material given at graduation.   Medication Safety: - Group verbal and visual instruction to review commonly prescribed medications for heart and lung disease. Reviews the medication, class of the drug, and side effects. Includes the steps to properly store meds and maintain the prescription regimen.  Written material given at  graduation.   Intimacy: - Group verbal instruction through game format to discuss how heart and lung disease can affect sexual intimacy. Written material given at graduation..   Know Your Numbers and Heart Failure: - Group verbal and visual instruction to discuss disease risk factors for cardiac and pulmonary disease and treatment options.  Reviews associated critical values for Overweight/Obesity, Hypertension, Cholesterol, and Diabetes.  Discusses basics of heart failure: signs/symptoms and treatments.  Introduces Heart Failure Zone chart for action plan for heart failure.  Written material given at graduation.   Infection Prevention: - Provides verbal and written material to individual with discussion of infection control including proper hand washing and proper equipment cleaning during exercise session. Flowsheet Row Cardiac Rehab from 05/30/2020 in [facility] Cardiac and Pulmonary Rehab  Education need identified 05/30/20  Date 05/30/20  Educator [place]  Instruction Review Code 1- Verbalizes Understanding      Falls Prevention: - Provides verbal and written material to individual with discussion of falls prevention and safety. Flowsheet Row Cardiac Rehab from 05/30/2020 in [facility] Cardiac and Pulmonary Rehab  Education need identified 05/30/20  Date 05/30/20  Educator [place]  Instruction Review Code 1- Verbalizes Understanding      Other: -Provides group and verbal instruction on various topics (see comments)   Knowledge Questionnaire Score:  Knowledge Questionnaire Score - 05/30/20 1655      Knowledge Questionnaire Score   Pre Score 23/26: Angina, Exercise, Nutrition           Core Components/Risk Factors/Patient Goals at Admission:  Personal Goals and Risk Factors at Admission - 05/30/20 1709      Core Components/Risk Factors/Patient Goals on Admission    Weight Management Yes;Weight Loss    Intervention Weight Management: Develop a combined nutrition and exercise program  designed to reach desired caloric intake, while maintaining appropriate intake of nutrient and fiber, sodium and fats, and appropriate energy expenditure required for the weight goal.;Weight Management: Provide education and appropriate resources to help participant work on and attain dietary goals.;Weight Management/Obesity: Establish reasonable short term and long term weight goals.    Admit Weight 177 lb (80.3 kg)    Goal Weight: Short Term 172 lb (78 kg)    Goal Weight: Long Term 167 lb (75.8 kg)    Expected Outcomes Short Term: Continue to assess and modify interventions until short term weight is achieved;Long Term: Adherence to nutrition and physical activity/exercise program aimed toward attainment  of established weight goal;Weight Loss: Understanding of general recommendations for a balanced deficit meal plan, which promotes 1-2 lb weight loss per week and includes a negative energy balance of [phone] kcal/d;Understanding recommendations for meals to include 15-35% energy as protein, 25-35% energy from fat, 35-60% energy from carbohydrates, less than 200mg  of dietary cholesterol, 20-35 gm of total fiber daily;Understanding of distribution of calorie intake throughout the day with the consumption of 4-5 meals/snacks    Tobacco Cessation Yes    Number of packs per day ~10 cigs/ day    Intervention Assist the participant in steps to quit. Provide individualized education and counseling about committing to Tobacco Cessation, relapse prevention, and pharmacological support that can be provided by physician.;Advice worker, assist with locating and accessing local/national Quit Smoking programs, and support quit date choice.    Expected Outcomes Short Term: Will demonstrate readiness to quit, by selecting a quit date.;Short Term: Will quit all tobacco product use, adhering to prevention of relapse plan.;Long Term: Complete abstinence from all tobacco products for at least 12 months from  quit date.    Diabetes Yes    Intervention Provide education about signs/symptoms and action to take for hypo/hyperglycemia.;Provide education about proper nutrition, including hydration, and aerobic/resistive exercise prescription along with prescribed medications to achieve blood glucose in normal ranges: Fasting glucose 65-99 mg/dL    Expected Outcomes Short Term: Participant verbalizes understanding of the signs/symptoms and immediate care of hyper/hypoglycemia, proper foot care and importance of medication, aerobic/resistive exercise and nutrition plan for blood glucose control.;Long Term: Attainment of HbA1C < 7%.    Hypertension Yes    Intervention Provide education on lifestyle modifcations including regular physical activity/exercise, weight management, moderate sodium restriction and increased consumption of fresh fruit, vegetables, and low fat dairy, alcohol moderation, and smoking cessation.;Monitor prescription use compliance.    Expected Outcomes Short Term: Continued assessment and intervention until BP is < 140/69mm HG in hypertensive participants. < 130/88mm HG in hypertensive participants with diabetes, heart failure or chronic kidney disease.;Long Term: Maintenance of blood pressure at goal levels.    Lipids Yes    Intervention Provide education and support for participant on nutrition & aerobic/resistive exercise along with prescribed medications to achieve LDL 70mg , HDL >40mg .    Expected Outcomes Short Term: Participant states understanding of desired cholesterol values and is compliant with medications prescribed. Participant is following exercise prescription and nutrition guidelines.;Long Term: Cholesterol controlled with medications as prescribed, with individualized exercise RX and with personalized nutrition plan. Value goals: LDL < 70mg , HDL > 40 mg.           Education:Diabetes - Individual verbal and written instruction to review signs/symptoms of diabetes, desired  ranges of glucose level fasting, after meals and with exercise. Acknowledge that pre and post exercise glucose checks will be done for 3 sessions at entry of program. [place] from 05/17/2020 in [facility] Cardiac and Pulmonary Rehab  Date 05/17/20  Educator [facility]  Instruction Review Code 1- Verbalizes Understanding      Core Components/Risk Factors/Patient Goals Review:    Core Components/Risk Factors/Patient Goals at Discharge (Final Review):    ITP Comments:  ITP Comments    [place] Name 05/17/20 1532 05/30/20 1643 05/30/20 1714 06/05/20 0739 06/10/20 1728   ITP Comments Initial telephone orientation completed. Diagnosis can be found in [facility] 1/26. EP orientation scheduled for Thursday 3/17 at 3pm Completed 6MWT and gym orientation. Initial ITP created and sent for review to Dr. Emily Filbert, Medical Director. [name]  is a current tobacco user. Intervention for tobacco cessation was provided at the initial medical review. She was asked about readiness to quit and reported she is ready to quit but has no start date yet . Patient reports she has nicotine patches and stopped using them, but plans to try to use them again. Patient was advised and educated about tobacco cessation using combination therapy, tobacco cessation classes, quit line, and quit smoking apps. Patient demonstrated understanding of this material. Staff will continue to provide encouragement and follow up with the patient throughout the program. 30 Day review completed. Medical Director ITP review done, changes made as directed, and signed approval by Medical Director. First full day of exercise!  Patient was oriented to gym and equipment including functions, settings, policies, and procedures.  Patient's individual exercise prescription and treatment plan were reviewed.  All starting workloads were established based on the results of the 6 minute walk test done at initial orientation visit.  The plan for exercise progression was  also introduced and progression will be customized based on patient's performance and goals.   Row Name 07/02/20 [id number] 07/03/20 0955 07/08/20 1624       ITP Comments Patient has not returned since first day of exercise. She is out right now due to husbdan with COVID as of 4/13 - may start PT. 30 Day review completed. Medical Director ITP review done, changes made as directed, and signed approval by Medical Director.  Out for medical reasons Called to check in with patient.  She is supposed to go back to doctor this week about it this week.  She now has gotten her walker.  She is also dealing with losing her husband's sister.  We will discharge her at this time.            Comments: Discharge ITP (only 2 visits)

## 2020-08-03 ENCOUNTER — Other Ambulatory Visit: Payer: Self-pay | Admitting: Family Medicine

## 2020-08-03 NOTE — Telephone Encounter (Signed)
Requested Prescriptions  Pending Prescriptions Disp Refills  . fenofibrate 160 MG tablet [Pharmacy Med Name: Fenofibrate 160 MG Oral Tablet] 90 tablet 1    Sig: Take 1 tablet by mouth once daily     Cardiovascular:  Antilipid - Fibric Acid Derivatives Failed - 08/03/2020  1:55 AM      Failed - LDL in normal range and within 360 days    LDL Chol Calc (NIH)  Date Value Ref Range Status  11/13/2019 119 (H) 0 - 99 mg/dL Final         Failed - HDL in normal range and within 360 days    HDL  Date Value Ref Range Status  11/13/2019 32 (L) >39 mg/dL Final         Failed - Triglycerides in normal range and within 360 days    Triglycerides  Date Value Ref Range Status  11/13/2019 153 (H) 0 - 149 mg/dL Final         Failed - ALT in normal range and within 180 days    ALT  Date Value Ref Range Status  11/13/2019 51 (H) 0 - 32 IU/L Final   SGPT (ALT)  Date Value Ref Range Status  11/24/2012 25 12 - 78 U/L Final         Failed - AST in normal range and within 180 days    AST  Date Value Ref Range Status  11/13/2019 39 0 - 40 IU/L Final   SGOT(AST)  Date Value Ref Range Status  11/24/2012 22 15 - 37 Unit/L Final         Failed - Cr in normal range and within 180 days    Creatinine  Date Value Ref Range Status  09/20/2013 0.82 0.60 - 1.30 mg/dL Final   Creatinine, Ser  Date Value Ref Range Status  01/03/2020 0.71 0.44 - 1.00 mg/dL Final         Failed - eGFR in normal range and within 180 days    EGFR (African American)  Date Value Ref Range Status  09/20/2013 >60  Final   GFR calc Af Amer  Date Value Ref Range Status  11/13/2019 114 >59 mL/min/1.73 Final    Comment:    **[facility] currently reports eGFR in compliance with the current**   recommendations of the [employer]. [facility] will   update reporting as new guidelines are published from the NKF-ASN   Task force.    EGFR (Non-African Amer.)  Date Value Ref Range Status  09/20/2013 >60  Final     Comment:    eGFR values <38m/min/1.73 m2 may be an indication of chronic kidney disease (CKD). Calculated eGFR is useful in patients with stable renal function. The eGFR calculation will not be reliable in acutely ill patients when serum creatinine is changing rapidly. It is not useful in  patients on dialysis. The eGFR calculation may not be applicable to patients at the low and high extremes of body sizes, pregnant women, and vegetarians.    GFR, Estimated  Date Value Ref Range Status  01/03/2020 >60 >60 mL/min Final         Passed - Total Cholesterol in normal range and within 360 days    Cholesterol, Total  Date Value Ref Range Status  11/13/2019 178 100 - 199 mg/dL Final         Passed - Valid encounter within last 12 months    Recent Outpatient Visits  8 months ago Chronic obstructive pulmonary disease, unspecified COPD type ([place])   [place], Megan P, DO   1 year ago Preop exam for internal medicine   [place], Megan P, DO   1 year ago Preop exam for internal medicine   [place], Megan P, DO   1 year ago COPD exacerbation [facility])   [place], Megan P, DO   1 year ago Chronic heel pain, left   [facility], Megan P, DO             . ezetimibe (ZETIA) 10 MG tablet [Pharmacy Med Name: Ezetimibe 10 MG Oral Tablet] 90 tablet 0    Sig: Take 1 tablet by mouth once daily     Cardiovascular:  Antilipid - Sterol Transport Inhibitors Failed - 08/03/2020  1:55 AM      Failed - LDL in normal range and within 360 days    LDL Chol Calc (NIH)  Date Value Ref Range Status  11/13/2019 119 (H) 0 - 99 mg/dL Final         Failed - HDL in normal range and within 360 days    HDL  Date Value Ref Range Status  11/13/2019 32 (L) >39 mg/dL Final         Failed - Triglycerides in normal range and within 360 days    Triglycerides  Date Value Ref Range Status   11/13/2019 153 (H) 0 - 149 mg/dL Final         Passed - Total Cholesterol in normal range and within 360 days    Cholesterol, Total  Date Value Ref Range Status  11/13/2019 178 100 - 199 mg/dL Final         Passed - Valid encounter within last 12 months    Recent Outpatient Visits          8 months ago Chronic obstructive pulmonary disease, unspecified COPD type ([place])   [place], [place], DO   1 year ago Preop exam for internal medicine   [facility] [place], Megan P, DO   1 year ago Preop exam for internal medicine   [facility] [place], Megan P, DO   1 year ago COPD exacerbation ([place])   [place], Megan P, DO   1 year ago Chronic heel pain, left   [facility], Megan P, DO             . fenofibrate 160 MG tablet [Pharmacy Med Name: Fenofibrate 160 MG Oral Tablet] 90 tablet 0    Sig: Take 1 tablet by mouth once daily     Cardiovascular:  Antilipid - Fibric Acid Derivatives Failed - 08/03/2020  1:55 AM      Failed - LDL in normal range and within 360 days    LDL Chol Calc (NIH)  Date Value Ref Range Status  11/13/2019 119 (H) 0 - 99 mg/dL Final         Failed - HDL in normal range and within 360 days    HDL  Date Value Ref Range Status  11/13/2019 32 (L) >39 mg/dL Final         Failed - Triglycerides in normal range and within 360 days    Triglycerides  Date Value Ref Range Status  11/13/2019 153 (H) 0 - 149 mg/dL Final         Failed - ALT in normal  range and within 180 days    ALT  Date Value Ref Range Status  11/13/2019 51 (H) 0 - 32 IU/L Final   SGPT (ALT)  Date Value Ref Range Status  11/24/2012 25 12 - 78 U/L Final         Failed - AST in normal range and within 180 days    AST  Date Value Ref Range Status  11/13/2019 39 0 - 40 IU/L Final   SGOT(AST)  Date Value Ref Range Status  11/24/2012 22 15 - 37 Unit/L Final         Failed - Cr in normal  range and within 180 days    Creatinine  Date Value Ref Range Status  09/20/2013 0.82 0.60 - 1.30 mg/dL Final   Creatinine, Ser  Date Value Ref Range Status  01/03/2020 0.71 0.44 - 1.00 mg/dL Final         Failed - eGFR in normal range and within 180 days    EGFR (African American)  Date Value Ref Range Status  09/20/2013 >60  Final   GFR calc Af Amer  Date Value Ref Range Status  11/13/2019 114 >59 mL/min/1.73 Final    Comment:    **[facility] currently reports eGFR in compliance with the current**   recommendations of the [employer]. [facility] will   update reporting as new guidelines are published from the NKF-ASN   Task force.    EGFR (Non-African Amer.)  Date Value Ref Range Status  09/20/2013 >60  Final    Comment:    eGFR values <47m/min/1.73 m2 may be an indication of chronic kidney disease (CKD). Calculated eGFR is useful in patients with stable renal function. The eGFR calculation will not be reliable in acutely ill patients when serum creatinine is changing rapidly. It is not useful in  patients on dialysis. The eGFR calculation may not be applicable to patients at the low and high extremes of body sizes, pregnant women, and vegetarians.    GFR, Estimated  Date Value Ref Range Status  01/03/2020 >60 >60 mL/min Final         Passed - Total Cholesterol in normal range and within 360 days    Cholesterol, Total  Date Value Ref Range Status  11/13/2019 178 100 - 199 mg/dL Final         Passed - Valid encounter within last 12 months    Recent Outpatient Visits          8 months ago Chronic obstructive pulmonary disease, unspecified COPD type ([place]   [place], DO   1 year ago Preop exam for internal medicine   [place]   1 year ago Preop exam for internal medicine   [place] DO   1 year ago COPD exacerbation ([facility]   [place], DO   1 year ago [facility] heel [facility], left   [facility] [place] [facility]

## 2020-08-19 ENCOUNTER — Other Ambulatory Visit: Payer: Self-pay | Admitting: Family Medicine

## 2020-08-19 NOTE — Telephone Encounter (Signed)
Requested medication (s) are due for refill today: yes  Requested medication (s) are on the active medication list: yes  Last refill:  11/13/19 #180  Future visit scheduled: no  Notes to clinic:  overdue lab work    Requested Prescriptions  Pending Prescriptions Disp Refills   gemfibrozil (LOPID) 600 MG tablet [Pharmacy Med Name: Gemfibrozil 600 MG Oral Tablet] 180 tablet 0    Sig: TAKE 1 TABLET BY MOUTH TWICE DAILY BEFORE A MEAL      Cardiovascular:  Antilipid - Fibric Acid Derivatives Failed - 08/19/2020 12:33 PM      Failed - LDL in normal range and within 360 days    LDL Chol Calc (NIH)  Date Value Ref Range Status  11/13/2019 119 (H) 0 - 99 mg/dL Final          Failed - HDL in normal range and within 360 days    HDL  Date Value Ref Range Status  11/13/2019 32 (L) >39 mg/dL Final          Failed - Triglycerides in normal range and within 360 days    Triglycerides  Date Value Ref Range Status  11/13/2019 153 (H) 0 - 149 mg/dL Final          Failed - ALT in normal range and within 180 days    ALT  Date Value Ref Range Status  11/13/2019 51 (H) 0 - 32 IU/L Final   SGPT (ALT)  Date Value Ref Range Status  11/24/2012 25 12 - 78 U/L Final          Failed - AST in normal range and within 180 days    AST  Date Value Ref Range Status  11/13/2019 39 0 - 40 IU/L Final   SGOT(AST)  Date Value Ref Range Status  11/24/2012 22 15 - 37 Unit/L Final          Failed - Cr in normal range and within 180 days    Creatinine  Date Value Ref Range Status  09/20/2013 0.82 0.60 - 1.30 mg/dL Final   Creatinine, Ser  Date Value Ref Range Status  01/03/2020 0.71 0.44 - 1.00 mg/dL Final          Failed - eGFR in normal range and within 180 days    EGFR (African American)  Date Value Ref Range Status  09/20/2013 >60  Final   GFR calc Af Amer  Date Value Ref Range Status  11/13/2019 114 >59 mL/min/1.73 Final    Comment:    **[facility] currently reports eGFR in  compliance with the current**   recommendations of the [employer]. [facility] will   update reporting as new guidelines are published from the NKF-ASN   Task force.    EGFR (Non-African Amer.)  Date Value Ref Range Status  09/20/2013 >60  Final    Comment:    eGFR values <43m/min/1.73 m2 may be an indication of chronic kidney disease (CKD). Calculated eGFR is useful in patients with stable renal function. The eGFR calculation will not be reliable in acutely ill patients when serum creatinine is changing rapidly. It is not useful in  patients on dialysis. The eGFR calculation may not be applicable to patients at the low and high extremes of body sizes, pregnant women, and vegetarians.    GFR, Estimated  Date Value Ref Range Status  01/03/2020 >60 >60 mL/min Final          Passed - Total Cholesterol in normal range and  within 360 days    Cholesterol, Total  Date Value Ref Range Status  11/13/2019 178 100 - 199 mg/dL Final          Passed - Valid encounter within last 12 months    Recent Outpatient Visits           9 months ago Chronic obstructive pulmonary disease, unspecified COPD type ([place])   [place], Megan P, DO   1 year ago Preop exam for internal medicine   [place], DO   1 year ago Preop exam for internal medicine   [place], DO   1 year ago COPD exacerbation [facility])   [place], Megan P, DO   1 year ago Chronic heel pain, left   [facility], [place], DO

## 2020-08-23 NOTE — Telephone Encounter (Signed)
Pt no longer our pt

## 2020-08-24 NOTE — Telephone Encounter (Signed)
No longer a pt at [facility]

## 2020-10-15 ENCOUNTER — Ambulatory Visit (INDEPENDENT_AMBULATORY_CARE_PROVIDER_SITE_OTHER): Payer: Medicaid Other | Admitting: Podiatry

## 2020-10-15 ENCOUNTER — Ambulatory Visit (INDEPENDENT_AMBULATORY_CARE_PROVIDER_SITE_OTHER): Payer: Medicaid Other

## 2020-10-15 ENCOUNTER — Other Ambulatory Visit: Payer: Self-pay

## 2020-10-15 DIAGNOSIS — M7662 Achilles tendinitis, left leg: Secondary | ICD-10-CM

## 2020-10-15 NOTE — Progress Notes (Signed)
HPI: 55 y.o. female presenting today as a new patient for evaluation of left posterior heel pain this been going on for several years.  Patient has a history of posterior heel spur resection with repair of Achilles tendon left January 2021.  She continues to have pain and tenderness.  She states that surgery, revisional, was recommended at [facility] clinic however she is transferring care over here due to insurance and recommendations for second opinion.  Past Medical History:  Diagnosis Date   Anginal pain ([place])    Anxiety    COPD (chronic obstructive pulmonary disease) ([place])    Deaf, left    Depression    Diabetes mellitus without complication ([place])    GERD (gastroesophageal reflux disease)    Hearing loss    Heart attack ([place])    2007   Hypertension    IBS (irritable bowel syndrome)    Pre-diabetes    RLS (restless legs syndrome)    RLS (restless legs syndrome)    Seizure ([place])    x1 - attributed to use of atorvastatin   Self-inflicted injury [id number]   Sleep apnea    no CPAP   Suicidal ideation 05/16/2015   Trigger finger of left thumb    Wears dentures    full upper   Wears hearing aid in right ear    has, does not wear     Physical Exam: General: The patient is alert and oriented x3 in no acute distress.  Dermatology: Skin is warm, dry and supple bilateral lower extremities. Negative for open lesions or macerations.  Vascular: Palpable pedal pulses bilaterally. No edema or erythema noted. Capillary refill within normal limits.  Neurological: Epicritic and protective threshold grossly intact bilaterally.   Musculoskeletal Exam: Pain on palpation of the posterior tubercle of the calcaneus left with enlargement of the posterior tubercle as well noted  Radiographic Exam:  Normal osseous mineralization. Joint spaces preserved. No fracture/dislocation/boney destruction.  Osteophyte formation noted posterior tubercle of the calcaneus embedded within the Achilles tendon on  lateral view  Assessment: 1.  Insertional Achilles tendinitis left 2.  History of retrocalcaneal exostectomy with repair of Achilles tendon LT.  Performed by Dr. Elvina Mattes.  January 2021   Plan of Care:  1. Patient evaluated. X-Rays reviewed.  2.  Patient continues to have pain and tenderness to the posterior tubercle of the calcaneus.  She cannot wear closed toed shoes and it is now been 1.5 years since surgery.  Patient states that revisional surgery was recommended in the past.  She is transferring care over here and would like to discuss surgery and second opinion regarding the Achilles tendon pain 3.  I do believe surgery would be a good viable option for the patient.  The posterior tubercle of the calcaneus is very enlarged and thickened.  Conservative treatments have not been successful to alleviate the area 4.  Prior to surgery, MRI required for surgical planning.  MRI ordered left ankle 5.  Return to clinic 1 month to review MRI and schedule surgery  *I treat her daughter, Olivia Mackie *Hoping to have surgery second week of October      Edrick Kins, DPM [facility]  Dr. Edrick Kins, DPM    [address]  [place], [place] [address]                Office [phone]  Fax [phone]

## 2020-11-13 ENCOUNTER — Encounter: Payer: Self-pay | Admitting: Podiatry

## 2020-11-14 ENCOUNTER — Telehealth: Payer: Self-pay | Admitting: Podiatry

## 2020-11-14 NOTE — Telephone Encounter (Signed)
Patient called stating she needs to get an MRI and they have not called her for an appointment. Patient wanted a follow up. Tele # [phone].

## 2020-11-15 ENCOUNTER — Ambulatory Visit: Payer: Medicaid Other | Admitting: Podiatry

## 2020-11-19 ENCOUNTER — Ambulatory Visit: Payer: Medicaid Other | Admitting: Podiatry

## 2020-11-19 ENCOUNTER — Other Ambulatory Visit: Payer: Self-pay

## 2020-11-26 ENCOUNTER — Encounter: Payer: Self-pay | Admitting: Podiatry

## 2020-11-27 NOTE — Telephone Encounter (Signed)
Brandi Acevedo, could you follow-up with this MRI order.  It was ordered on 11/15/2020.  Please update patient.  Thanks, Dr. Amalia Hailey

## 2020-11-27 NOTE — Telephone Encounter (Signed)
Not sure what to do about this or who can add this into the patient's chart

## 2020-12-09 ENCOUNTER — Ambulatory Visit: Payer: Medicaid Other

## 2021-01-14 ENCOUNTER — Other Ambulatory Visit: Payer: Self-pay | Admitting: Family Medicine

## 2021-01-15 NOTE — Telephone Encounter (Signed)
CAlled pt. States she is seeing Dr. Ola Spurr as PCP.  States will alert pharmacy

## 2021-01-29 ENCOUNTER — Other Ambulatory Visit: Payer: Self-pay | Admitting: Family Medicine

## 2021-01-30 NOTE — Telephone Encounter (Signed)
Requested Prescriptions  Refused Prescriptions Disp Refills  . ezetimibe (ZETIA) 10 MG tablet [Pharmacy Med Name: Ezetimibe 10 MG Oral Tablet] 90 tablet 0    Sig: Take 1 tablet by mouth once daily     Cardiovascular:  Antilipid - Sterol Transport Inhibitors Failed - 01/29/2021 12:58 PM      Failed - Total Cholesterol in normal range and within 360 days    Cholesterol, Total  Date Value Ref Range Status  11/13/2019 178 100 - 199 mg/dL Final         Failed - LDL in normal range and within 360 days    LDL Chol Calc (NIH)  Date Value Ref Range Status  11/13/2019 119 (H) 0 - 99 mg/dL Final         Failed - HDL in normal range and within 360 days    HDL  Date Value Ref Range Status  11/13/2019 32 (L) >39 mg/dL Final         Failed - Triglycerides in normal range and within 360 days    Triglycerides  Date Value Ref Range Status  11/13/2019 153 (H) 0 - 149 mg/dL Final         Failed - Valid encounter within last 12 months    Recent Outpatient Visits          1 year ago Chronic obstructive pulmonary disease, unspecified COPD type ([place])   [place], Megan P, DO   1 year ago Preop exam for internal medicine   [place], Megan P, DO   1 year ago Preop exam for internal medicine   [place], Megan P, DO   2 years ago COPD exacerbation [facility])   [place], Megan P, DO   2 years ago Chronic heel pain, left   [place], [place], DO

## 2021-02-16 ENCOUNTER — Other Ambulatory Visit: Payer: Self-pay | Admitting: Family Medicine

## 2021-02-16 NOTE — Telephone Encounter (Signed)
Has new PCP Dr Ola Spurr at [place]

## 2021-04-14 ENCOUNTER — Other Ambulatory Visit: Payer: Self-pay | Admitting: Family Medicine

## 2021-04-14 NOTE — Telephone Encounter (Signed)
Requested medications are due for refill today.  yes  Requested medications are on the active medications list.  yes  Last refill. 11/13/2019  Future visit scheduled.   no  Notes to clinic.  Pt not seen for over 1 year. Pcp listed as Juliane Poot.    Requested Prescriptions  Pending Prescriptions Disp Refills   hydrOXYzine (ATARAX) 25 MG tablet [Pharmacy Med Name: hydrOXYzine HCl 25 MG Oral Tablet] 90 tablet 0    Sig: Take 1 tablet by mouth three times daily as needed     Ear, Nose, and Throat:  Antihistamines Failed - 04/14/2021  3:57 PM      Failed - Valid encounter within last 12 months    Recent Outpatient Visits           1 year ago Chronic obstructive pulmonary disease, unspecified COPD type ([place])   [place], Megan P, DO   2 years ago Preop exam for internal medicine   [place], Megan P, DO   2 years ago Preop exam for internal medicine   [place], Megan P, DO   2 years ago COPD exacerbation [facility])   [place], [place], DO   2 years ago Chronic heel pain, left   [place], [place], DO

## 2021-04-15 NOTE — Telephone Encounter (Signed)
Lmom asking pt to call back to clarify PCP

## 2021-04-17 NOTE — Telephone Encounter (Signed)
Called pt.  Pt no longer sees Dr. Wynetta Emery.  She is now a pt with Dr. Ola Spurr at [place] [facility]  - part of the [facility].    Requested Prescriptions  Pending Prescriptions Disp Refills   hydrOXYzine (ATARAX) 25 MG tablet [Pharmacy Med Name: hydrOXYzine HCl 25 MG Oral Tablet] 90 tablet 0    Sig: Take 1 tablet by mouth three times daily as needed     Ear, Nose, and Throat:  Antihistamines Failed - 04/14/2021  3:57 PM      Failed - Valid encounter within last 12 months    Recent Outpatient Visits          1 year ago Chronic obstructive pulmonary disease, unspecified COPD type ([place])   [place], Megan P, DO   2 years ago Preop exam for internal medicine   [place], Megan P, DO   2 years ago Preop exam for internal medicine   [place], Megan P, DO   2 years ago COPD exacerbation ([place])   [place], [place], DO   2 years ago Chronic heel pain, left   [facility] [place], [place] P, DO            Ear, Nose, and Throat:  Antihistamines 2 Failed - 04/16/2021  2:09 PM      Failed - Cr in normal range and within 360 days    Creatinine  Date Value Ref Range Status  09/20/2013 0.82 0.60 - 1.30 mg/dL Final   Creatinine, Ser  Date Value Ref Range Status  01/03/2020 0.71 0.44 - 1.00 mg/dL Final         Failed - Valid encounter within last 12 months    Recent Outpatient Visits          1 year ago Chronic obstructive pulmonary disease, unspecified COPD type ([place])   [place], Megan P, DO   2 years ago Preop exam for internal medicine   [place], Megan P, DO   2 years ago Preop exam for internal medicine   [place], Megan P, DO   2 years ago COPD exacerbation [facility])   [place], DO   2 years ago Chronic heel pain, left   [place], [place], DO

## 2021-12-16 ENCOUNTER — Other Ambulatory Visit: Payer: Self-pay | Admitting: *Deleted

## 2021-12-16 DIAGNOSIS — Z122 Encounter for screening for malignant neoplasm of respiratory organs: Secondary | ICD-10-CM

## 2021-12-16 DIAGNOSIS — Z87891 Personal history of nicotine dependence: Secondary | ICD-10-CM

## 2021-12-16 DIAGNOSIS — F17210 Nicotine dependence, cigarettes, uncomplicated: Secondary | ICD-10-CM

## 2022-01-07 ENCOUNTER — Ambulatory Visit (INDEPENDENT_AMBULATORY_CARE_PROVIDER_SITE_OTHER): Payer: Medicaid Other | Admitting: Acute Care

## 2022-01-07 ENCOUNTER — Encounter: Payer: Self-pay | Admitting: Acute Care

## 2022-01-07 ENCOUNTER — Ambulatory Visit
Admission: RE | Admit: 2022-01-07 | Discharge: 2022-01-07 | Disposition: A | Discharge status: Home / Self Care | Payer: Medicaid Other | Source: Ambulatory Visit | Attending: Acute Care | Admitting: Acute Care

## 2022-01-07 DIAGNOSIS — F17210 Nicotine dependence, cigarettes, uncomplicated: Secondary | ICD-10-CM | POA: Diagnosis present

## 2022-01-07 DIAGNOSIS — Z122 Encounter for screening for malignant neoplasm of respiratory organs: Secondary | ICD-10-CM | POA: Insufficient documentation

## 2022-01-07 DIAGNOSIS — Z87891 Personal history of nicotine dependence: Secondary | ICD-10-CM | POA: Insufficient documentation

## 2022-01-07 NOTE — Patient Instructions (Signed)
Thank you for participating in the [facility] Scre[facility]. It was our pleasure to meet you today. We will call you with the results of your scan within the next few days. Your scan will be assigned a Lung RADS category score by the physicians reading the scans.  This Lung RADS score determines follow up scanning.  See below for description of categories, and follow up screening recommendations. We will be in touch to schedule your follow up screening annually or based on recommendations of our providers. We will fax a copy of your scan results to your Primary Care Physician, or the physician who referred you to the program, to ensure they have the results. Please call the office if you have any questions or concerns regarding your scanning experience or results.  Our office number is [phone]. Please speak with Denise Phelps, RN. , or  Denise Buckner RN, They are  our Lung Cancer Screening RN.'s If They are unavailable when you call, Please leave a message on the voice mail. We will return your call at our earliest convenience.This voice mail is monitored several times a day.  Remember, if your scan is normal, we will scan you annually as long as you continue to meet the criteria for the program. (Age 55-77, Current smoker or smoker who has quit within the last 15 years). If you are a smoker, remember, quitting is the single most powerful action that you can take to decrease your risk of lung cancer and other pulmonary, breathing related problems. We know quitting is hard, and we are here to help.  Please let us know if there is anything we can do to help you meet your goal of quitting. If you are a former smoker, congratulations. We are proud of you! Remain smoke free! Remember you can refer friends or family members through the number above.  We will screen them to make sure they meet criteria for the program. Thank you for helping us take better care of you by  participating in Lung Screening.  You can receive free nicotine replacement therapy ( patches, gum or mints) by calling 1-800-QUIT NOW. Please call so we can get you on the path to becoming  a non-smoker. I know it is hard, but you can do this!  Lung RADS Categories:  Lung RADS 1: no nodules or definitely non-concerning nodules.  Recommendation is for a repeat annual scan in 12 months.  Lung RADS 2:  nodules that are non-concerning in appearance and behavior with a very low likelihood of becoming an active cancer. Recommendation is for a repeat annual scan in 12 months.  Lung RADS 3: nodules that are probably non-concerning , includes nodules with a low likelihood of becoming an active cancer.  Recommendation is for a 6-month repeat screening scan. Often noted after an upper respiratory illness. We will be in touch to make sure you have no questions, and to schedule your 6-month scan.  Lung RADS 4 A: nodules with concerning findings, recommendation is most often for a follow up scan in 3 months or additional testing based on our provider's assessment of the scan. We will be in touch to make sure you have no questions and to schedule the recommended 3 month follow up scan.  Lung RADS 4 B:  indicates findings that are concerning. We will be in touch with you to schedule additional diagnostic testing based on our provider's  assessment of the scan.  Other options for assistance in smoking cessation (   As covered by your insurance benefits)  Hypnosis for smoking cessation  [employer] [phone]  Acupuncture for smoking cessation  [facility] [phone]   

## 2022-01-07 NOTE — Progress Notes (Signed)
Virtual Visit via Telephone Note  I connected with Brandi Acevedo on 01/07/22 at 10:30 AM EDT by telephone and verified that I am speaking with the correct person using two identifiers.  Location: Patient:  At home Provider:  [place], [place], [place], [address]    I discussed the limitations, risks, security and privacy concerns of performing an evaluation and management service by telephone and the availability of in person appointments. I also discussed with the patient that there may be a patient responsible charge related to this service. The patient expressed understanding and agreed to proceed.   Shared Decision Making Visit Lung Cancer Screening Program [id number])   Eligibility: Age 56 y.o. Pack Years Smoking History Calculation 30 pack year smoking history (# packs/per year x # years smoked) Recent History of coughing up blood  no Unexplained weight loss? no ( >Than 15 pounds within the last 6 months ) Prior History Lung / other cancer no (Diagnosis within the last 5 years already requiring surveillance chest CT Scans). Smoking Status Current Smoker Former Smokers: Years since quit:  NA  Quit Date:  NA  Visit Components: Discussion included one or more decision making aids. yes Discussion included risk/benefits of screening. yes Discussion included potential follow up diagnostic testing for abnormal scans. yes Discussion included meaning and risk of over diagnosis. yes Discussion included meaning and risk of False Positives. yes Discussion included meaning of total radiation exposure. yes  Counseling Included: Importance of adherence to annual lung cancer LDCT screening. yes Impact of comorbidities on ability to participate in the program. yes Ability and willingness to under diagnostic treatment. yes  Smoking Cessation Counseling: Current Smokers:  Discussed importance of smoking cessation. yes Information about tobacco cessation classes and  interventions provided to patient. yes Patient provided with "ticket" for LDCT Scan. yes Symptomatic Patient. no  Counseling NA Diagnosis Code: Tobacco Use Z72.0 Asymptomatic Patient yes  Counseling (Intermediate counseling: > three minutes counseling) C3762 Former Smokers:  Discussed the importance of maintaining cigarette abstinence. yes Diagnosis Code: Personal History of Nicotine Dependence. G31.517 Information about tobacco cessation classes and interventions provided to patient. Yes Patient provided with "ticket" for LDCT Scan. yes Written Order for Lung Cancer Screening with LDCT placed in Epic. Yes (CT Chest Lung Cancer Screening Low Dose W/O CM) [id number] Z12.2-Screening of respiratory organs Z87.891-Personal history of nicotine dependence  I have spent 25 minutes of face to face/ virtual visit   time with  Brandi Acevedo discussing the risks and benefits of lung cancer screening. We viewed / discussed a power point together that explained in detail the above noted topics. We paused at intervals to allow for questions to be asked and answered to ensure understanding.We discussed that the single most powerful action that she can take to decrease her risk of developing lung cancer is to quit smoking. We discussed whether or not she is ready to commit to setting a quit date. We discussed options for tools to aid in quitting smoking including nicotine replacement therapy, non-nicotine medications, support groups, Quit Smart classes, and behavior modification. We discussed that often times setting smaller, more achievable goals, such as eliminating 1 cigarette a day for a week and then 2 cigarettes a day for a week can be helpful in slowly decreasing the number of cigarettes smoked. This allows for a sense of accomplishment as well as providing a clinical benefit. I provided  her  with smoking cessation  information  with contact information for community resources, classes,  free nicotine replacement  therapy, and access to mobile apps, text messaging, and on-line smoking cessation help. I have also provided  her  the office contact information in the event she needs to contact me, or the screening staff. We discussed the time and location of the scan, and that either Doroteo Glassman RN, Joella Prince, RN  or I will call / send a letter with the results within 24-72 hours of receiving them. The patient verbalized understanding of all of  the above and had no further questions upon leaving the office. They have my contact information in the event they have any further questions.  I spent 3 minutes counseling on smoking cessation and the health risks of continued tobacco abuse.  I explained to the patient that there has been a high incidence of coronary artery disease noted on these exams. I explained that this is a non-gated exam therefore degree or severity cannot be determined. This patient is not on statin therapy. I have asked the patient to follow-up with their PCP regarding any incidental finding of coronary artery disease and management with diet or medication as their PCP  feels is clinically indicated. The patient verbalized understanding of the above and had no further questions upon completion of the visit.      Magdalen Spatz, NP 01/07/2022

## 2022-01-12 ENCOUNTER — Other Ambulatory Visit: Payer: Self-pay | Admitting: Acute Care

## 2022-01-12 DIAGNOSIS — Z87891 Personal history of nicotine dependence: Secondary | ICD-10-CM

## 2022-01-12 DIAGNOSIS — Z122 Encounter for screening for malignant neoplasm of respiratory organs: Secondary | ICD-10-CM

## 2022-01-12 DIAGNOSIS — F17210 Nicotine dependence, cigarettes, uncomplicated: Secondary | ICD-10-CM

## 2022-07-24 ENCOUNTER — Other Ambulatory Visit: Payer: Self-pay | Admitting: Infectious Diseases

## 2022-07-24 DIAGNOSIS — M542 Cervicalgia: Secondary | ICD-10-CM

## 2022-07-24 DIAGNOSIS — T1490XA Injury, unspecified, initial encounter: Secondary | ICD-10-CM

## 2022-07-24 DIAGNOSIS — R42 Dizziness and giddiness: Secondary | ICD-10-CM

## 2022-07-27 ENCOUNTER — Ambulatory Visit
Admission: RE | Admit: 2022-07-27 | Discharge: 2022-07-27 | Disposition: A | Discharge status: Home / Self Care | Payer: Medicaid Other | Source: Ambulatory Visit | Attending: Infectious Diseases | Admitting: Infectious Diseases

## 2022-07-27 DIAGNOSIS — R42 Dizziness and giddiness: Secondary | ICD-10-CM | POA: Diagnosis present

## 2022-07-27 DIAGNOSIS — M542 Cervicalgia: Secondary | ICD-10-CM | POA: Diagnosis present

## 2022-07-27 DIAGNOSIS — T1490XA Injury, unspecified, initial encounter: Secondary | ICD-10-CM

## 2022-08-25 ENCOUNTER — Other Ambulatory Visit: Payer: Self-pay | Admitting: Physical Medicine & Rehabilitation

## 2022-08-25 DIAGNOSIS — M542 Cervicalgia: Secondary | ICD-10-CM

## 2022-08-26 ENCOUNTER — Encounter: Payer: Self-pay | Admitting: Physical Medicine & Rehabilitation

## 2022-09-05 ENCOUNTER — Ambulatory Visit
Admission: RE | Admit: 2022-09-05 | Discharge: 2022-09-05 | Disposition: A | Discharge status: Home / Self Care | Payer: Medicaid Other | Source: Ambulatory Visit | Attending: Physical Medicine & Rehabilitation | Admitting: Physical Medicine & Rehabilitation

## 2022-09-05 DIAGNOSIS — M542 Cervicalgia: Secondary | ICD-10-CM

## 2022-09-25 ENCOUNTER — Inpatient Hospital Stay
Admission: RE | Admit: 2022-09-25 | Discharge: 2022-09-25 | Disposition: A | Discharge status: Home / Self Care | Payer: Self-pay | Source: Ambulatory Visit | Attending: Neurosurgery | Admitting: Neurosurgery

## 2022-09-25 ENCOUNTER — Other Ambulatory Visit: Payer: Self-pay

## 2022-09-25 DIAGNOSIS — Z049 Encounter for examination and observation for unspecified reason: Secondary | ICD-10-CM

## 2022-10-01 NOTE — Progress Notes (Deleted)
Referring Physician:  Elijah Birk, MD [address] [place],  [place] [address]  Primary Physician:  Mick Sell, MD  History of Present Illness: 10/01/2022 Ms. Brandi Acevedo is here today with a chief complaint of neck pain traveling to the right arm  Duration: 07/16/2022 with a fall Location: *** Quality: ***numbness, tingling, weakness in the right arm Severity: ***  Precipitating: aggravated by bending, lifting, laying down, coughing, sneezing Modifying factors: made better by *** Weakness: none Timing: *** Bowel/Bladder Dysfunction: none  Conservative measures:  Physical therapy: *** no Multimodal medical therapy including regular antiinflammatories: *** Gabapentin, prednisone, lidocaine patches, Voltaren Gel, Cyclobenzaprine Injections: *** epidural steroid injections  Past Surgery: ***  Brandi Acevedo has ***no symptoms of cervical myelopathy.  The symptoms are causing a significant impact on the patient's life.   I have utilized the care everywhere function in epic to review the outside records available from external health systems.  Review of Systems:  A 10 point review of systems is negative, except for the pertinent positives and negatives detailed in the HPI.  Past Medical History: Past Medical History:  Diagnosis Date   Anginal pain (HCC)    Anxiety    COPD (chronic obstructive pulmonary disease) (HCC)    Deaf, left    Depression    Diabetes mellitus without complication (HCC)    GERD (gastroesophageal reflux disease)    Hearing loss    Heart attack (HCC)    2007   Hypertension    IBS (irritable bowel syndrome)    Pre-diabetes    RLS (restless legs syndrome)    RLS (restless legs syndrome)    Seizure (HCC)    x1 - attributed to use of atorvastatin   Self-inflicted injury 05/16/2015   Sleep apnea    no CPAP   Suicidal ideation 05/16/2015   Trigger finger of left thumb    Wears dentures    full upper   Wears hearing aid  in right ear    has, does not wear    Past Surgical History: Past Surgical History:  Procedure Laterality Date   ABDOMINAL HYSTERECTOMY     ACHILLES TENDON REPAIR Right    [facility] - Dr Orland Jarred   ACHILLES TENDON SURGERY Left 03/23/2019   Procedure: ACHILLES REPAIR-SECONDARY LEFT;  Surgeon: Recardo Evangelist, DPM;  Location: [facility];  Service: Podiatry;  Laterality: Left;  LMA WITH POP BLOCK   CALCANEAL OSTEOTOMY Left 03/23/2019   Procedure: PARTIAL CALCANECTOMY LEFT;  Surgeon: Recardo Evangelist, DPM;  Location: [facility];  Service: Podiatry;  Laterality: Left;  sleep apnea   CARDIAC CATHETERIZATION  2007   LAD stent   CARPAL TUNNEL RELEASE Right 04/23/2016   Procedure: CARPAL TUNNEL RELEASE;  Surgeon: Kennedy Bucker, MD;  Location: [facility];  Service: Orthopedics;  Laterality: Right;   CESAREAN SECTION     X 3   CHOLECYSTECTOMY     CORONARY STENT INTERVENTION N/A 01/02/2020   Procedure: CORONARY STENT INTERVENTION;  Surgeon: Marcina Millard, MD;  Location: [facility];  Service: Cardiovascular;  Laterality: N/A;   DILATION AND CURETTAGE OF UTERUS     HERNIA REPAIR     LEFT HEART CATH AND CORONARY ANGIOGRAPHY Left 01/02/2020   Procedure: LEFT HEART CATH AND CORONARY ANGIOGRAPHY;  Surgeon: Marcina Millard, MD;  Location: [facility];  Service: Cardiovascular;  Laterality: Left;   TONSILLECTOMY     TYMPANOSTOMY TUBE PLACEMENT     X 2  Allergies: Allergies as of 10/05/2022 - Review Complete 01/07/2022  Allergen Reaction Noted   Amoxicillin-pot clavulanate Itching 03/26/2016   Ativan [lorazepam] Other (See Comments) 07/14/2015   Atorvastatin Other (See Comments) 01/16/2016   Codeine  11/11/2014   Wellbutrin [bupropion] Other (See Comments) 05/21/2015   Flagyl [metronidazole] Rash 11/11/2014   Keflex [cephalexin] Rash 11/11/2014   Septra [sulfamethoxazole-trimethoprim] Rash 11/11/2014    Medications:  Current Outpatient Medications:     aspirin 81 MG chewable tablet, Chew 1 tablet (81 mg total) by mouth daily., Disp: 30 tablet, Rfl: 6   citalopram (CELEXA) 40 MG tablet, Take 1 tablet by mouth once daily, Disp: 90 tablet, Rfl: 0   diclofenac Sodium (VOLTAREN) 1 % GEL, Apply 2 g topically daily as needed (pain). (Patient not taking: Reported on 05/17/2020), Disp: , Rfl:    ezetimibe (ZETIA) 10 MG tablet, Take 1 tablet by mouth once daily, Disp: 90 tablet, Rfl: 1   fenofibrate 160 MG tablet, Take 1 tablet by mouth once daily, Disp: 90 tablet, Rfl: 1   gabapentin (NEURONTIN) 300 MG capsule, Take 300 mg by mouth daily., Disp: , Rfl:    gemfibrozil (LOPID) 600 MG tablet, TAKE 1 TABLET BY MOUTH TWICE DAILY BEFORE A MEAL (Patient taking differently: Take 600 mg by mouth 2 (two) times daily before a meal. TAKE 1 TABLET BY MOUTH TWICE DAILY BEFORE A MEA), Disp: 180 tablet, Rfl: 0   hydrOXYzine (ATARAX/VISTARIL) 25 MG tablet, Take 1 tablet (25 mg total) by mouth 3 (three) times daily as needed. (Patient taking differently: Take 25 mg by mouth at bedtime.), Disp: 270 tablet, Rfl: 0   pantoprazole (PROTONIX) 40 MG tablet, Take 1 tablet (40 mg total) by mouth daily. (Patient taking differently: Take 40 mg by mouth at bedtime.), Disp: 90 tablet, Rfl: 0   rOPINIRole (REQUIP) 1 MG tablet, Take 1 mg by mouth at bedtime., Disp: , Rfl:    rOPINIRole (REQUIP) 2 MG tablet, Take 2 mg by mouth at bedtime., Disp: , Rfl:    ticagrelor (BRILINTA) 90 MG TABS tablet, Take 1 tablet (90 mg total) by mouth 2 (two) times daily., Disp: 60 tablet, Rfl: 6  Social History: Social History   Tobacco Use   Smoking status: Every Day    Current packs/day: 0.50    Average packs/day: 0.5 packs/day for 30.0 years (15.0 ttl pk-yrs)    Types: Cigarettes   Smokeless tobacco: Never  Vaping Use   Vaping status: Never Used  Substance Use Topics   Alcohol use: No   Drug use: No    Family Medical History: Family History  Problem Relation Age of Onset   Diabetes Mother     Restless legs syndrome Mother    Sleep apnea Mother    Hypertension Mother    Hyperlipidemia Mother    Diabetes Father    Hypertension Father    Hyperlipidemia Father    Heart disease Father    Heart attack Father    Diabetes Sister    Hyperlipidemia Sister    Hypertension Sister    Heart disease Sister    Heart attack Sister    Hyperlipidemia Daughter    Mental illness Daughter        Bipolar Disorder   Heart disease Maternal Grandmother    Stroke Maternal Grandfather    Heart attack Maternal Grandfather    Cancer Paternal Grandmother    Hyperlipidemia Daughter     Physical Examination: There were no vitals filed for this visit.  General: Patient  is in no apparent distress. Attention to examination is appropriate.  Neck:   Supple.  Full range of motion.  Respiratory: Patient is breathing without any difficulty.   NEUROLOGICAL:     Awake, alert, oriented to person, place, and time.  Speech is clear and fluent.   Cranial Nerves: Pupils equal round and reactive to light.  Facial tone is symmetric.  Facial sensation is symmetric. Shoulder shrug is symmetric. Tongue protrusion is midline.    Strength: Side Biceps Triceps Deltoid Interossei Grip Wrist Ext. Wrist Flex.  R 5 5 5 5 5 5 5   L 5 5 5 5 5 5 5    Side Iliopsoas Quads Hamstring PF DF EHL  R 5 5 5 5 5 5   L 5 5 5 5 5 5    Reflexes are ***2+ and symmetric at the biceps, triceps, brachioradialis, patella and achilles.   Hoffman's is absent. Clonus is absent  Bilateral upper and lower extremity sensation is intact to light touch ***.     No evidence of dysmetria noted.  Gait is normal.    Imaging: *** I have personally reviewed the images and agree with the above interpretation.  Medical Decision Making/Assessment and Plan: Ms. Deitrick is a pleasant 57 y.o. female with ***  There are no diagnoses linked to this encounter.   Thank you for involving me in the care of this patient.    Lovenia Kim  MD/MSCR Neurosurgery

## 2022-10-05 ENCOUNTER — Encounter: Payer: Self-pay | Admitting: Neurosurgery

## 2022-10-05 ENCOUNTER — Ambulatory Visit: Payer: Medicaid Other | Admitting: Neurosurgery

## 2022-10-05 ENCOUNTER — Ambulatory Visit (INDEPENDENT_AMBULATORY_CARE_PROVIDER_SITE_OTHER): Payer: Medicaid Other | Admitting: Neurosurgery

## 2022-10-05 VITALS — BP 122/82 | Wt 176.0 lb

## 2022-10-05 DIAGNOSIS — M5010 Cervical disc disorder with radiculopathy, unspecified cervical region: Secondary | ICD-10-CM

## 2022-10-05 DIAGNOSIS — M50123 Cervical disc disorder at C6-C7 level with radiculopathy: Secondary | ICD-10-CM

## 2022-10-05 NOTE — Progress Notes (Signed)
Referring Physician:  Elijah Birk, MD [address] [place],  [place] [address]  Primary Physician:  Mick Sell, MD  History of Present Illness: 10/05/2022 Brandi Acevedo is here today with a chief complaint of neck pain traveling to the right arm.  She states that she has been dealing with this since approximately April.  She suffered a fall in which she then developed significant right-sided upper extremity radiating pain starts in her neck.  This often goes to her shoulder.  She does feel some numbness weakness and tingling in that extremity.  It is not worsening but has not gotten any better.  She states that it gets worse with coughing lying down bending lifting or any Valsalva techniques.  She does not have any significant ways of alleviating it.  She does get worsening pain with neck extension.  She does not have any red flag symptoms including loss of bowel or bladder function or decreased ambulation.  Conservative measures:  Physical therapy: Not had any physical therapy Multimodal medical therapy including regular antiinflammatories:  Gabapentin, prednisone, lidocaine patches, Voltaren Gel, Cyclobenzaprine Injections: No epidural steroid injections  Past Surgery: none  The symptoms are causing a significant impact on the patient's life.   I have utilized the care everywhere function in epic to review the outside records available from external health systems.  Review of Systems:  A 10 point review of systems is negative, except for the pertinent positives and negatives detailed in the HPI.  Past Medical History: Past Medical History:  Diagnosis Date   Anginal pain (HCC)    Anxiety    COPD (chronic obstructive pulmonary disease) (HCC)    Deaf, left    Depression    Diabetes mellitus without complication (HCC)    GERD (gastroesophageal reflux disease)    Hearing loss    Heart attack (HCC)    2007   Hypertension    IBS (irritable bowel  syndrome)    Pre-diabetes    RLS (restless legs syndrome)    RLS (restless legs syndrome)    Seizure (HCC)    x1 - attributed to use of atorvastatin   Self-inflicted injury 05/16/2015   Sleep apnea    no CPAP   Suicidal ideation 05/16/2015   Trigger finger of left thumb    Wears dentures    full upper   Wears hearing aid in right ear    has, does not wear    Past Surgical History: Past Surgical History:  Procedure Laterality Date   ABDOMINAL HYSTERECTOMY     ACHILLES TENDON REPAIR Right    [facility] - Dr Orland Jarred   ACHILLES TENDON SURGERY Left 03/23/2019   Procedure: ACHILLES REPAIR-SECONDARY LEFT;  Surgeon: Recardo Evangelist, DPM;  Location: [facility];  Service: Podiatry;  Laterality: Left;  LMA WITH POP BLOCK   CALCANEAL OSTEOTOMY Left 03/23/2019   Procedure: PARTIAL CALCANECTOMY LEFT;  Surgeon: Recardo Evangelist, DPM;  Location: [facility];  Service: Podiatry;  Laterality: Left;  sleep apnea   CARDIAC CATHETERIZATION  2007   LAD stent   CARPAL TUNNEL RELEASE Right 04/23/2016   Procedure: CARPAL TUNNEL RELEASE;  Surgeon: Kennedy Bucker, MD;  Location: [facility];  Service: Orthopedics;  Laterality: Right;   CESAREAN SECTION     X 3   CHOLECYSTECTOMY     CORONARY STENT INTERVENTION N/A 01/02/2020   Procedure: CORONARY STENT INTERVENTION;  Surgeon: Marcina Millard, MD;  Location: [facility];  Service: Cardiovascular;  Laterality:  N/A;   DILATION AND CURETTAGE OF UTERUS     HERNIA REPAIR     LEFT HEART CATH AND CORONARY ANGIOGRAPHY Left 01/02/2020   Procedure: LEFT HEART CATH AND CORONARY ANGIOGRAPHY;  Surgeon: Marcina Millard, MD;  Location: [facility];  Service: Cardiovascular;  Laterality: Left;   TONSILLECTOMY     TYMPANOSTOMY TUBE PLACEMENT     X 2    Allergies: Allergies as of 10/05/2022 - Review Complete 01/07/2022  Allergen Reaction Noted   Amoxicillin-pot clavulanate Itching 03/26/2016   Ativan [lorazepam] Other (See Comments)  07/14/2015   Atorvastatin Other (See Comments) 01/16/2016   Wellbutrin [bupropion] Other (See Comments) 05/21/2015   Flagyl [metronidazole] Rash 11/11/2014   Keflex [cephalexin] Rash 11/11/2014   Septra [sulfamethoxazole-trimethoprim] Rash 11/11/2014    Medications:  Current Outpatient Medications:    albuterol (VENTOLIN HFA) 108 (90 Base) MCG/ACT inhaler, Inhale 2 puffs into the lungs every 6 (six) hours as needed., Disp: , Rfl:    aspirin 81 MG chewable tablet, Chew 1 tablet (81 mg total) by mouth daily., Disp: 30 tablet, Rfl: 6   carbidopa-levodopa (SINEMET CR) 50-200 MG tablet, Take 1 tablet by mouth at bedtime., Disp: , Rfl:    carbidopa-levodopa (SINEMET IR) 25-100 MG tablet, Take 1 tablet by mouth 3 (three) times daily., Disp: , Rfl:    citalopram (CELEXA) 40 MG tablet, Take 1 tablet by mouth once daily, Disp: 90 tablet, Rfl: 0   diclofenac Sodium (VOLTAREN) 1 % GEL, Apply 2 g topically daily as needed (pain)., Disp: , Rfl:    ezetimibe (ZETIA) 10 MG tablet, Take 1 tablet by mouth once daily, Disp: 90 tablet, Rfl: 1   fenofibrate 160 MG tablet, Take 1 tablet by mouth once daily, Disp: 90 tablet, Rfl: 1   gabapentin (NEURONTIN) 300 MG capsule, Take 300 mg by mouth daily., Disp: , Rfl:    gemfibrozil (LOPID) 600 MG tablet, TAKE 1 TABLET BY MOUTH TWICE DAILY BEFORE A MEAL (Patient taking differently: Take 600 mg by mouth 2 (two) times daily before a meal. TAKE 1 TABLET BY MOUTH TWICE DAILY BEFORE A MEA), Disp: 180 tablet, Rfl: 0   hydrOXYzine (ATARAX/VISTARIL) 25 MG tablet, Take 1 tablet (25 mg total) by mouth 3 (three) times daily as needed. (Patient taking differently: Take 25 mg by mouth at bedtime.), Disp: 270 tablet, Rfl: 0   levocetirizine (XYZAL) 5 MG tablet, Take 5 mg by mouth daily., Disp: , Rfl:    meclizine (ANTIVERT) 12.5 MG tablet, Take 12.5 mg by mouth 2 (two) times daily as needed., Disp: , Rfl:    metFORMIN (GLUCOPHAGE-XR) 500 MG 24 hr tablet, Take 500 mg by mouth daily  with breakfast., Disp: , Rfl:    oxybutynin (DITROPAN-XL) 5 MG 24 hr tablet, Take 5 mg by mouth daily., Disp: , Rfl:    pantoprazole (PROTONIX) 40 MG tablet, Take 1 tablet (40 mg total) by mouth daily. (Patient taking differently: Take 40 mg by mouth at bedtime.), Disp: 90 tablet, Rfl: 0   rOPINIRole (REQUIP) 1 MG tablet, Take 1 mg by mouth at bedtime., Disp: , Rfl:    rOPINIRole (REQUIP) 2 MG tablet, Take 2 mg by mouth at bedtime., Disp: , Rfl:    ticagrelor (BRILINTA) 90 MG TABS tablet, Take 1 tablet (90 mg total) by mouth 2 (two) times daily., Disp: 60 tablet, Rfl: 6   traZODone (DESYREL) 50 MG tablet, Take 50 mg by mouth at bedtime., Disp: , Rfl:   Social History: Social History  Tobacco Use   Smoking status: Every Day    Current packs/day: 0.50    Average packs/day: 0.5 packs/day for 30.0 years (15.0 ttl pk-yrs)    Types: Cigarettes   Smokeless tobacco: Never  Vaping Use   Vaping status: Never Used  Substance Use Topics   Alcohol use: No   Drug use: No    Family Medical History: Family History  Problem Relation Age of Onset   Diabetes Mother    Restless legs syndrome Mother    Sleep apnea Mother    Hypertension Mother    Hyperlipidemia Mother    Diabetes Father    Hypertension Father    Hyperlipidemia Father    Heart disease Father    Heart attack Father    Diabetes Sister    Hyperlipidemia Sister    Hypertension Sister    Heart disease Sister    Heart attack Sister    Hyperlipidemia Daughter    Mental illness Daughter        Bipolar Disorder   Heart disease Maternal Grandmother    Stroke Maternal Grandfather    Heart attack Maternal Grandfather    Cancer Paternal Grandmother    Hyperlipidemia Daughter     Physical Examination: Vitals:   10/05/22 1352  BP: 122/82    General: Patient is in no apparent distress. Attention to examination is appropriate.  Neck:   Supple.  Full range of motion.  She does get significant radiating pains down the right upper  extremity with extensive cervical extension.  Respiratory: Patient is breathing without any difficulty.   NEUROLOGICAL:     Awake, alert, oriented to person, place, and time.  Speech is clear and fluent.   Cranial Nerves: Pupils equal round and reactive to light.  Facial tone is symmetric.  Facial sensation is symmetric. Shoulder shrug is symmetric. Tongue protrusion is midline.    Strength: Side Biceps Triceps Deltoid Interossei Grip Wrist Ext. Wrist Flex.  R 4+ 4+ 5 5 5  4+ 5  L 5 5 5 5 5 5 5     Reflexes are 2+ and symmetric at the biceps, triceps, brachioradialis, patella and achilles.   Hoffman's is absent. Clonus is absent  Bilateral upper and lower extremity sensation is intact to light touch with the exception of some nondermatomal right upper extremity sensation loss     No evidence of dysmetria noted.  Gait is normal.    Imaging: Narrative & Impression  CLINICAL DATA:  Initial evaluation for neck pain radiating into the right upper extremity.   EXAM: MRI CERVICAL SPINE WITHOUT CONTRAST   TECHNIQUE: Multiplanar, multisequence MR imaging of the cervical spine was performed. No intravenous contrast was administered.   COMPARISON:  None Available.   FINDINGS: Alignment: Straightening with mild smooth reversal of the normal cervical lordosis. Trace degenerative anterolisthesis of C2 on C3 and C3 on C4.   Vertebrae: Vertebral body height maintained without acute or chronic fracture. Bone marrow signal intensity within normal limits. No worrisome osseous lesions. Scattered reactive endplate changes noted about the C3-4 through C6-7 interspaces. No other abnormal marrow edema.   Cord: Normal signal and morphology.   Posterior Fossa, vertebral arteries, paraspinal tissues: Unremarkable.   Disc levels:   C2-C3: Mild disc bulge with bilateral uncovertebral spurring. Moderate left facet hypertrophy. No spinal stenosis. Mild to moderate left C3 foraminal  narrowing. Right neural foramina remains patent.   C3-C4: Degenerative disc space narrowing with diffuse disc osteophyte complex. Flattening of the ventral thecal sac with resultant mild  spinal stenosis. Moderate right worse than left C4 foraminal stenosis.   C4-C5: Degenerative intervertebral disc space narrowing with diffuse disc osteophyte complex. Flattening and effacement of the ventral thecal sac with mild cord flattening, but no cord signal changes. Moderate spinal stenosis. Moderate left worse than right C5 foraminal narrowing.   C5-C6: Degenerative disc space narrowing with diffuse disc osteophyte complex, asymmetric to the right. Broad posterior component flattens and effaces the ventral thecal sac. Secondary cord flattening without cord signal changes. Severe spinal stenosis. Moderate left with severe right C6 foraminal narrowing.   C6-C7: Degenerative disc space narrowing with diffuse disc osteophyte complex. Flattening of the ventral thecal sac with mild spinal stenosis. Moderate left with mild right C7 foraminal narrowing.   C7-T1: Mild disc bulge with uncovertebral spurring. Mild facet hypertrophy. No spinal stenosis. Mild left C8 foraminal narrowing. Right neural foramen remains patent.   IMPRESSION: 1. Multilevel cervical spondylosis with resultant diffuse spinal stenosis at C3-4 through C6-7, severe in nature at C5-6. Secondary mild diffuse cord flattening through this region without cord signal changes. 2. Multifactorial degenerative changes with resultant multilevel foraminal narrowing as above. Notable findings include moderate bilateral C4 and C5 foraminal stenosis, moderate left with severe right C6 foraminal narrowing, with moderate left C7 foraminal stenosis.     Electronically Signed   By: Rise Mu M.D.   On: 09/10/2022 18:47     I have personally reviewed the images and agree with the above interpretation.  Medical Decision  Making/Assessment and Plan: Ms. Brandi Acevedo is a pleasant 57 y.o. female with cervical stenosis and right-sided cervical radiculopathy.  She had a fall which exacerbated her pain back in April which causes pain shooting down her arm mostly down the back but to her entire hand.  She gets intermittent numbness and tingling.  Feels some mild weakness which has been stable.  On physical exam she has a positive Spurling with intact reflexes and some guarding in the right upper extremity but at least 4+ out of 5 strength in deltoids wrist extension and elbow flexion as well as triceps extension.  Her imaging demonstrates cervical stenosis with foraminal stenosis most severe at C6 and C7.  1. Cervical disc disorder with radiculopathy of cervical region Given her symptomatic right-sided cervical spondylosis with multilevel foraminal stenosis as well as central stenosis.  She does not demonstrate any signs of myelopathy, but she does have signs of a radiculopathy most significant at C6 and C7.  She has some very mild weakness noted but does have continued numbness and tingling.  She has not had any physical therapy nor injections.  We would like her to establish a physical therapy regimen as some patients will have a good recovery with this not necessitating any surgery.  She also has a smoking history we urged her to quit and explained to her the high risk of anterior cervical surgery approaches and smoking history causing failure.  We discussed injections at this time she was hesitant to undergo them.  Would like to follow her up in approximately 3 months to evaluate whether or not she has any improvement.  We also like to get cervical flexion-extension x-rays to evaluate for any excess motion.  Thank you for involving me in the care of this patient.    Lovenia Kim MD/MSCR Neurosurgery

## 2022-12-18 ENCOUNTER — Other Ambulatory Visit: Payer: Self-pay | Admitting: Podiatry

## 2022-12-18 DIAGNOSIS — M79671 Pain in right foot: Secondary | ICD-10-CM

## 2023-01-02 ENCOUNTER — Ambulatory Visit
Admission: RE | Admit: 2023-01-02 | Discharge: 2023-01-02 | Disposition: A | Discharge status: Home / Self Care | Payer: 59 | Source: Ambulatory Visit | Attending: Podiatry | Admitting: Podiatry

## 2023-01-02 DIAGNOSIS — M79671 Pain in right foot: Secondary | ICD-10-CM

## 2023-01-04 ENCOUNTER — Telehealth: Payer: Self-pay

## 2023-01-04 NOTE — Telephone Encounter (Signed)
-----   Message from [place] B sent at 01/04/2023 12:47 PM EDT ----- Regarding: RE: xrays Patient states she is feeling better and no longer needs the appointment. Appointment has been cancelled. ----- Message ----- From: Aretha Parrot, CMA Sent: 01/04/2023  12:01 PM EDT To: Rowe Pavy Subject: xrays                                          Hey this is one of Dr. Lonn Georgia patients and she is coming in next week to see him, but never had the xrays that he ordered. Can you please remind her to have these done before her appt on 01/13/23. Thank you!

## 2023-01-05 NOTE — Telephone Encounter (Signed)
Pt canceled appt b/c she is feeling better.

## 2023-01-06 ENCOUNTER — Ambulatory Visit: Payer: Medicaid Other | Admitting: Neurosurgery

## 2023-01-11 ENCOUNTER — Ambulatory Visit: Admission: RE | Admit: 2023-01-11 | Payer: 59 | Source: Ambulatory Visit

## 2023-01-13 ENCOUNTER — Ambulatory Visit: Payer: 59 | Admitting: Neurosurgery

## 2023-01-27 ENCOUNTER — Other Ambulatory Visit (INDEPENDENT_AMBULATORY_CARE_PROVIDER_SITE_OTHER): Payer: Self-pay | Admitting: Podiatry

## 2023-01-27 DIAGNOSIS — I739 Peripheral vascular disease, unspecified: Secondary | ICD-10-CM

## 2023-02-10 ENCOUNTER — Ambulatory Visit (INDEPENDENT_AMBULATORY_CARE_PROVIDER_SITE_OTHER): Payer: 59

## 2023-02-10 DIAGNOSIS — I739 Peripheral vascular disease, unspecified: Secondary | ICD-10-CM

## 2023-02-25 ENCOUNTER — Other Ambulatory Visit: Payer: Self-pay

## 2023-02-25 ENCOUNTER — Encounter: Payer: Self-pay | Admitting: Radiology

## 2023-02-25 ENCOUNTER — Emergency Department
Admission: EM | Admit: 2023-02-25 | Discharge: 2023-02-25 | Discharge status: Against Medical Advice | Payer: 59 | Attending: Emergency Medicine | Admitting: Emergency Medicine

## 2023-02-25 DIAGNOSIS — Z5321 Procedure and treatment not carried out due to patient leaving prior to being seen by health care provider: Secondary | ICD-10-CM | POA: Diagnosis not present

## 2023-02-25 DIAGNOSIS — R55 Syncope and collapse: Secondary | ICD-10-CM | POA: Diagnosis present

## 2023-02-25 LAB — BASIC METABOLIC PANEL
Anion gap: 10 (ref 5–15)
BUN: 14 mg/dL (ref 6–20)
CO2: 24 mmol/L (ref 22–32)
Calcium: 9.1 mg/dL (ref 8.9–10.3)
Chloride: 98 mmol/L (ref 98–111)
Creatinine, Ser: 0.71 mg/dL (ref 0.44–1.00)
GFR, Estimated: >60 mL/min (ref >60)
Glucose, Bld: 130 mg/dL — ABNORMAL HIGH (ref 70–99)
Potassium: 4.1 mmol/L (ref 3.5–5.1)
Sodium: 132 mmol/L — ABNORMAL LOW (ref 135–145)

## 2023-02-25 LAB — CBC
HCT: 43.8 % (ref 36.0–46.0)
Hemoglobin: 14.4 g/dL (ref 12.0–15.0)
MCH: 30.5 pg (ref 26.0–34.0)
MCHC: 32.9 g/dL (ref 30.0–36.0)
MCV: 92.8 fL (ref 80.0–100.0)
Platelets: 327 10*3/uL (ref 150–400)
RBC: 4.72 MIL/uL (ref 3.87–5.11)
RDW: 13.2 % (ref 11.5–15.5)
WBC: 9 10*3/uL (ref 4.0–10.5)
nRBC: 0 % (ref 0.0–0.2)

## 2023-02-25 LAB — TROPONIN I (HIGH SENSITIVITY): Troponin I (High Sensitivity): 15 ng/L (ref <18)

## 2023-02-25 NOTE — ED Triage Notes (Signed)
Via EMS for near syncope per staff at business where she had the episode.  Incontinent of bowel during event.  EMS VS: 101/61, 77,bpm, 100% RA No meds given en route  H/o MI 2007

## 2023-02-25 NOTE — ED Triage Notes (Signed)
Pt was at an appointment and was sitting in the office and began feeling "woozy" and patient states that she blacked out. She states that she let the staff know that she had to use the bathroom but they didn't let her go so she had a bowel movement on herself. Pt states after she woke up from her black out spell she knew she needed to use the bathroom but when not allowed to go she used the bathroom on herself. Pt states she didn't fell well today (her mouth was hurting) Pt states that she had all of her teeth on the bottom pulled yesterday and was at the dentist for a follow up appt.

## 2023-02-25 NOTE — ED Notes (Signed)
No answer when called several times from lobby 

## 2023-05-05 ENCOUNTER — Inpatient Hospital Stay: Admission: RE | Admit: 2023-05-05 | Payer: 59 | Source: Ambulatory Visit

## 2023-05-13 ENCOUNTER — Ambulatory Visit: Admit: 2023-05-13 | Payer: 59 | Admitting: Podiatry

## 2023-05-13 SURGERY — ACHILLES TENDON REPAIR
Anesthesia: Choice | Laterality: Left

## 2023-08-03 ENCOUNTER — Encounter (INDEPENDENT_AMBULATORY_CARE_PROVIDER_SITE_OTHER): Payer: Self-pay

## 2023-12-03 ENCOUNTER — Encounter: Payer: Self-pay | Admitting: Infectious Diseases
